# Patient Record
Sex: Female | Born: 1995 | Race: Black or African American | Hispanic: No | Marital: Single | Smoking: Never smoker
Health system: Southern US, Community
[De-identification: ages and names within clinical notes are randomized; demographics above are authoritative.]

## PROBLEM LIST (undated history)

## (undated) DIAGNOSIS — F32A Depression, unspecified: Secondary | ICD-10-CM

## (undated) DIAGNOSIS — F329 Major depressive disorder, single episode, unspecified: Secondary | ICD-10-CM

## (undated) DIAGNOSIS — G932 Benign intracranial hypertension: Secondary | ICD-10-CM

## (undated) DIAGNOSIS — F419 Anxiety disorder, unspecified: Secondary | ICD-10-CM

## (undated) DIAGNOSIS — F909 Attention-deficit hyperactivity disorder, unspecified type: Secondary | ICD-10-CM

## (undated) DIAGNOSIS — E301 Precocious puberty: Secondary | ICD-10-CM

## (undated) HISTORY — DX: Precocious puberty: E30.1

## (undated) HISTORY — DX: Benign intracranial hypertension: G93.2

## (undated) HISTORY — DX: Attention-deficit hyperactivity disorder, unspecified type: F90.9

## (undated) HISTORY — DX: Anxiety disorder, unspecified: F41.9

## (undated) HISTORY — PX: NO PAST SURGERIES: SHX2092

---
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
## 2016-01-04 ENCOUNTER — Telehealth: Payer: Self-pay | Admitting: Neurology

## 2016-01-04 NOTE — Telephone Encounter (Signed)
Please advise 

## 2016-01-04 NOTE — Telephone Encounter (Signed)
PT called and wanted to know what she can take for menstrual cramps because she said that Dr Tat said not to take aleve and the tylenol is not working/Dawn CB# [phone]

## 2016-01-04 NOTE — Telephone Encounter (Signed)
I said not to take aleve?  i don't remember telling her that at all.  I advised her not to take OTC supplements but I don't remember her asking me about aleve?

## 2016-01-04 NOTE — Telephone Encounter (Signed)
Patient made aware okay to take aleve if needed.

## 2016-01-19 ENCOUNTER — Encounter: Payer: Self-pay | Admitting: Physician Assistant

## 2016-01-19 ENCOUNTER — Ambulatory Visit (INDEPENDENT_AMBULATORY_CARE_PROVIDER_SITE_OTHER): Payer: Self-pay | Admitting: Physician Assistant

## 2016-01-19 VITALS — BP 90/68 | HR 91 | Temp 98.3°F | Ht 68.0 in | Wt 257.4 lb

## 2016-01-19 DIAGNOSIS — M25562 Pain in left knee: Secondary | ICD-10-CM

## 2016-01-19 DIAGNOSIS — J069 Acute upper respiratory infection, unspecified: Secondary | ICD-10-CM | POA: Insufficient documentation

## 2016-01-19 DIAGNOSIS — M25569 Pain in unspecified knee: Secondary | ICD-10-CM | POA: Insufficient documentation

## 2016-01-19 NOTE — Patient Instructions (Signed)
Please stay well-hydrated.  Get some saline nasal spray and use to keep nasal passages clear.  Use some Mucinex-DM for cough and congestion. Get plenty of rest.  Symptoms should resolve over the next 3-5 days.  For the knee -- ice the knee and keep elevated.  Tylenol or Ibuprofen for pain. This will resolve over a few days but try not to bump into anything else!  Upper Respiratory Infection, Adult Most upper respiratory infections (URIs) are a viral infection of the air passages leading to the lungs. A URI affects the nose, throat, and upper air passages. The most common type of URI is nasopharyngitis and is typically referred to as "the common cold." URIs run their course and usually go away on their own. Most of the time, a URI does not require medical attention, but sometimes a bacterial infection in the upper airways can follow a viral infection. This is called a secondary infection. Sinus and middle ear infections are common types of secondary upper respiratory infections. Bacterial pneumonia can also complicate a URI. A URI can worsen asthma and chronic obstructive pulmonary disease (COPD). Sometimes, these complications can require emergency medical care and may be life threatening.  CAUSES Almost all URIs are caused by viruses. A virus is a type of germ and can spread from one person to another.  RISKS FACTORS You may be at risk for a URI if:   You smoke.   You have chronic heart or lung disease.  You have a weakened defense (immune) system.   You are very young or very old.   You have nasal allergies or asthma.  You work in crowded or poorly ventilated areas.  You work in health care facilities or schools. SIGNS AND SYMPTOMS  Symptoms typically develop 2-3 days after you come in contact with a cold virus. Most viral URIs last 7-10 days. However, viral URIs from the influenza virus (flu virus) can last 14-18 days and are typically more severe. Symptoms may include:    Runny or stuffy (congested) nose.   Sneezing.   Cough.   Sore throat.   Headache.   Fatigue.   Fever.   Loss of appetite.   Pain in your forehead, behind your eyes, and over your cheekbones (sinus pain).  Muscle aches.  DIAGNOSIS  Your health care provider may diagnose a URI by:  Physical exam.  Tests to check that your symptoms are not due to another condition such as:  Strep throat.  Sinusitis.  Pneumonia.  Asthma. TREATMENT  A URI goes away on its own with time. It cannot be cured with medicines, but medicines may be prescribed or recommended to relieve symptoms. Medicines may help:  Reduce your fever.  Reduce your cough.  Relieve nasal congestion. HOME CARE INSTRUCTIONS   Take medicines only as directed by your health care provider.   Gargle warm saltwater or take cough drops to comfort your throat as directed by your health care provider.  Use a warm mist humidifier or inhale steam from a shower to increase air moisture. This may make it easier to breathe.  Drink enough fluid to keep your urine clear or pale yellow.   Eat soups and other clear broths and maintain good nutrition.   Rest as needed.   Return to work when your temperature has returned to normal or as your health care provider advises. You may need to stay home longer to avoid infecting others. You can also use a face mask and careful hand washing  to prevent spread of the virus.  Increase the usage of your inhaler if you have asthma.   Do not use any tobacco products, including cigarettes, chewing tobacco, or electronic cigarettes. If you need help quitting, ask your health care provider. PREVENTION  The best way to protect yourself from getting a cold is to practice good hygiene.   Avoid oral or hand contact with people with cold symptoms.   Wash your hands often if contact occurs.  There is no clear evidence that vitamin C, vitamin E, echinacea, or exercise  reduces the chance of developing a cold. However, it is always recommended to get plenty of rest, exercise, and practice good nutrition.  SEEK MEDICAL CARE IF:   You are getting worse rather than better.   Your symptoms are not controlled by medicine.   You have chills.  You have worsening shortness of breath.  You have brown or red mucus.  You have yellow or brown nasal discharge.  You have pain in your face, especially when you bend forward.  You have a fever.  You have swollen neck glands.  You have pain while swallowing.  You have white areas in the back of your throat. SEEK IMMEDIATE MEDICAL CARE IF:   You have severe or persistent:  Headache.  Ear pain.  Sinus pain.  Chest pain.  You have chronic lung disease and any of the following:  Wheezing.  Prolonged cough.  Coughing up blood.  A change in your usual mucus.  You have a stiff neck.  You have changes in your:  Vision.  Hearing.  Thinking.  Mood. MAKE SURE YOU:   Understand these instructions.  Will watch your condition.  Will get help right away if you are not doing well or get worse.   This information is not intended to replace advice given to you by your health care provider. Make sure you discuss any questions you have with your health care provider.   Document Released: 05/09/2001 Document Revised: 03/30/2015 Document Reviewed: 02/18/2014 Elsevier Interactive Patient Education [employer]

## 2016-01-19 NOTE — Progress Notes (Signed)
Pre visit review using our clinic review tool, if applicable. No additional management support is needed unless otherwise documented below in the visit note. 

## 2016-01-19 NOTE — Assessment & Plan Note (Signed)
Mild symptoms. Afebrile. Exam negative except for nasal congestion. Increase hydration. Mucinex-DM OTC. Supportive measures reviewed.

## 2016-01-19 NOTE — Progress Notes (Signed)
Patient presents to clinic today c/o 3 days of mild cough (dry), nasal congestion and runny nose. Denies fever, chills, chest congestion. Denies recent travel. Brother recently diagnosed with the flu.   Patient c/o L knee pain since this AM. Endorses hitting the outside of the knee on her bedpost this morning. Denies bruising of the knee. Denies other trauma or injury.   Past Medical History  Diagnosis Date  . ADHD (attention deficit hyperactivity disorder)   . Precocious puberty     Was followed by endocrinology.    Current Outpatient Prescriptions on File Prior to Visit  Medication Sig Dispense Refill  . acetaZOLAMIDE (DIAMOX) 500 MG capsule Take 1 capsule (500 mg total) by mouth 2 (two) times daily. 60 capsule 5  . escitalopram (LEXAPRO) 10 MG tablet Take 1 tablet by mouth at bedtime. Reported on 01/19/2016    . methylphenidate (DAYTRANA) 30 MG/9HR Place 1 patch onto the skin daily. Reported on 01/19/2016     No current facility-administered medications on file prior to visit.    Allergies  Allergen Reactions  . Lupron [Leuprolide Acetate]     Was allergic to a preservative in the Lupron.    Family History  Problem Relation Age of Onset  . Hypertension Mother   . Diabetes Father   . Heart disease Father   . Cancer Neg Hx     Social History   Social History  . Marital Status: Single    Spouse Name: N/A  . Number of Children: 0  . Years of Education: N/A   Occupational History  . student     [place] History Main Topics  . Smoking status: Never Smoker   . Smokeless tobacco: Never Used  . Alcohol Use: No  . Drug Use: No  . Sexual Activity: Not Asked   Other Topics Concern  . None   Social History Narrative   Caffeine Use:  3 daily   Regular Exercise:  2-3 x weekly   Lives with Mom step dad and brother   Attends [employer].   Patient is the granddaughter of [place] [employer] - See HPI.  All other ROS are  negative.  BP 90/68 mmHg  Pulse 91  Temp(Src) 98.3 F (36.8 C) (Oral)  Ht '5\' 8"'  (1.727 m)  Wt 257 lb 6.4 oz (116.756 kg)  BMI 39.15 kg/m2  SpO2 99%  LMP 12/29/2015  Physical Exam  Constitutional: She is well-developed, well-nourished, and in no distress.  HENT:  Head: Normocephalic and atraumatic.  Right Ear: External ear normal.  Left Ear: External ear normal.  Nose: Nose normal.  Mouth/Throat: Oropharynx is clear and moist. No oropharyngeal exudate.  TM within normal limits bilaterally.  Eyes: Conjunctivae are normal.  Neck: Neck supple.  Cardiovascular: Normal rate, regular rhythm, normal heart sounds and intact distal pulses.   Pulmonary/Chest: Effort normal and breath sounds normal. No respiratory distress. She has no wheezes. She has no rales. She exhibits no tenderness.  Musculoskeletal:       Left knee: She exhibits normal range of motion, no swelling, normal alignment, no LCL laxity, normal patellar mobility, no bony tenderness, normal meniscus and no MCL laxity. Tenderness found.  Skin: Skin is warm and dry. No rash noted.  Psychiatric: Affect normal.  Vitals reviewed.   Recent Results (from the past 2160 hour(s))  TSH     Status: None   Collection  Time: 11/17/15  3:26 PM  Result Value Ref Range   TSH 2.05 0.40 - 5.00 uIU/mL  Basic Metabolic Panel (BMET)     Status: None   Collection Time: 11/17/15  3:26 PM  Result Value Ref Range   Sodium 138 135 - 145 mEq/L   Potassium 4.1 3.5 - 5.1 mEq/L   Chloride 106 96 - 112 mEq/L   CO2 24 19 - 32 mEq/L   Glucose, Bld 84 70 - 99 mg/dL   BUN 8 6 - 23 mg/dL   Creatinine, Ser 0.54 0.40 - 1.20 mg/dL   Calcium 9.1 8.4 - 10.5 mg/dL   GFR 187.12 >60.00 mL/min  CBC     Status: Abnormal   Collection Time: 11/17/15  3:26 PM  Result Value Ref Range   WBC 9.2 4.5 - 13.5 K/uL   RBC 4.48 3.80 - 5.70 Mil/uL   Platelets 314.0 150.0 - 575.0 K/uL   Hemoglobin 11.9 (L) 12.0 - 16.0 g/dL   HCT 37.1 36.0 - 49.0 %   MCV 82.7 78.0 -  98.0 fl   MCHC 32.0 31.0 - 37.0 g/dL   RDW 14.7 11.4 - 15.5 %  Pregnancy, urine     Status: None   Collection Time: 12/11/15  8:55 PM  Result Value Ref Range   Preg Test, Ur NEGATIVE NEGATIVE    Comment:        THE SENSITIVITY OF THIS METHODOLOGY IS >20 mIU/mL.   Urinalysis, Routine w reflex microscopic (not at [facility])     Status: Abnormal   Collection Time: 12/11/15  8:55 PM  Result Value Ref Range   Color, Urine YELLOW YELLOW   APPearance CLOUDY (A) CLEAR   Specific Gravity, Urine 1.023 1.005 - 1.030   pH 7.5 5.0 - 8.0   Glucose, UA NEGATIVE NEGATIVE mg/dL   Hgb urine dipstick NEGATIVE NEGATIVE   Bilirubin Urine NEGATIVE NEGATIVE   Ketones, ur NEGATIVE NEGATIVE mg/dL   Protein, ur NEGATIVE NEGATIVE mg/dL   Nitrite NEGATIVE NEGATIVE   Leukocytes, UA LARGE (A) NEGATIVE  Urine microscopic-add on     Status: Abnormal   Collection Time: 12/11/15  8:55 PM  Result Value Ref Range   Squamous Epithelial / LPF 0-5 (A) NONE SEEN   WBC, UA 6-30 0 - 5 WBC/hpf   RBC / HPF 6-30 0 - 5 RBC/hpf   Bacteria, UA MANY (A) NONE SEEN  Basic metabolic panel     Status: None   Collection Time: 12/11/15  9:50 PM  Result Value Ref Range   Sodium 139 135 - 145 mmol/L   Potassium 4.2 3.5 - 5.1 mmol/L   Chloride 107 101 - 111 mmol/L   CO2 27 22 - 32 mmol/L   Glucose, Bld 91 65 - 99 mg/dL   BUN 12 6 - 20 mg/dL   Creatinine, Ser 0.54 0.44 - 1.00 mg/dL   Calcium 9.2 8.9 - 10.3 mg/dL   GFR calc non Af Amer >60 >60 mL/min   GFR calc Af Amer >60 >60 mL/min    Comment: (NOTE) The eGFR has been calculated using the CKD EPI equation. This calculation has not been validated in all clinical situations. eGFR's persistently <60 mL/min signify possible Chronic Kidney Disease.    Anion gap 5 5 - 15  CBC     Status: None   Collection Time: 12/11/15  9:50 PM  Result Value Ref Range   WBC 8.5 4.0 - 10.5 K/uL   RBC 4.61 3.87 - 5.11 MIL/uL  Hemoglobin 12.1 12.0 - 15.0 g/dL   HCT 38.5 36.0 - 46.0 %   MCV  83.5 78.0 - 100.0 fL   MCH 26.2 26.0 - 34.0 pg   MCHC 31.4 30.0 - 36.0 g/dL   RDW 14.1 11.5 - 15.5 %   Platelets 313 150 - 400 K/uL    Assessment/Plan: Knee pain Mild contusion. No bruising or swelling. RICE reviewed. OTC NSAID.  Viral URI Mild symptoms. Afebrile. Exam negative except for nasal congestion. Increase hydration. Mucinex-DM OTC. Supportive measures reviewed.

## 2016-01-19 NOTE — Assessment & Plan Note (Signed)
Mild contusion. No bruising or swelling. RICE reviewed. OTC NSAID.

## 2016-02-14 ENCOUNTER — Telehealth: Payer: Self-pay | Admitting: Neurology

## 2016-02-14 ENCOUNTER — Ambulatory Visit: Payer: Self-pay | Admitting: Physician Assistant

## 2016-02-14 DIAGNOSIS — G932 Benign intracranial hypertension: Secondary | ICD-10-CM

## 2016-02-14 NOTE — Telephone Encounter (Signed)
Left message on machine for patient's mother to call back.

## 2016-02-14 NOTE — Telephone Encounter (Signed)
Patient's mother called and had a question regarding her daughter. Her daughter's name is Annette Molina d.o.b 12/03/1996. Her call back number is [phone].

## 2016-02-14 NOTE — Telephone Encounter (Signed)
CBC, CMP.  Needs to drastically increase water intake.  I want her to fill up a gallon milk jug every day with water and drink 1/2 of that jug

## 2016-02-14 NOTE — Telephone Encounter (Addendum)
Spoke with patient's mother- she states patient has complained of headaches every day for the last week. She also had an episode of "zoning out" at work yesterday. States she just stopped and stood still for a few seconds, not focusing on anything. Patient did not remember doing this, but her boss told her she did. She has been compliant with Diamox twice daily. No vision changes. No other new symptoms. They have a follow up appt next month. Please advise.

## 2016-02-14 NOTE — Telephone Encounter (Signed)
Spoke with patient's mom. They state they don't remember being told to get lab work. They will go tomorrow to get this done. Any additional labs needed? She also states patient has lost 7-8 lbs, states she has been trying, but hasn't been very successful. Water intake is not high.

## 2016-02-14 NOTE — Telephone Encounter (Signed)
She was supposed to have a BMP 10 days after she started the diamox.  I don't see that she did that.  She still needs that done.  Is she drinking plenty of water with it?  Has she been working on the weight loss aspect?

## 2016-02-14 NOTE — Telephone Encounter (Signed)
PT's mother Eloise HarmanLakaisha called in regards to her having headaches again/Dawn CB# [phone]

## 2016-02-15 ENCOUNTER — Ambulatory Visit (INDEPENDENT_AMBULATORY_CARE_PROVIDER_SITE_OTHER): Payer: BLUE CROSS/BLUE SHIELD | Admitting: Physician Assistant

## 2016-02-15 ENCOUNTER — Encounter: Payer: Self-pay | Admitting: Physician Assistant

## 2016-02-15 VITALS — BP 110/70 | HR 82 | Temp 98.0°F | Ht 68.0 in | Wt 258.4 lb

## 2016-02-15 DIAGNOSIS — E669 Obesity, unspecified: Secondary | ICD-10-CM | POA: Diagnosis not present

## 2016-02-15 LAB — TSH: TSH: 1.53 u[IU]/mL (ref 0.40–5.00)

## 2016-02-15 NOTE — Progress Notes (Signed)
Patient presents to clinic today to discuss options for weight loss. Endorses starting diet and exercise regimen recommended by Neurology for her pseudotumor cerebri. Endorses good weight loss but has noted weight gain recently. Is not exercising as much as before. Was told by specialist to follow-up with PCP.  Past Medical History  Diagnosis Date  . ADHD (attention deficit hyperactivity disorder)   . Precocious puberty     Was followed by endocrinology.    Current Outpatient Prescriptions on File Prior to Visit  Medication Sig Dispense Refill  . acetaZOLAMIDE (DIAMOX) 500 MG capsule Take 1 capsule (500 mg total) by mouth 2 (two) times daily. 60 capsule 5   No current facility-administered medications on file prior to visit.    Allergies  Allergen Reactions  . Lupron [Leuprolide Acetate]     Was allergic to a preservative in the Lupron.    Family History  Problem Relation Age of Onset  . Hypertension Mother   . Diabetes Father   . Heart disease Father   . Cancer Neg Hx     Social History   Social History  . Marital Status: Single    Spouse Name: N/A  . Number of Children: 0  . Years of Education: N/A   Occupational History  . student     [place] History Main Topics  . Smoking status: Never Smoker   . Smokeless tobacco: Never Used  . Alcohol Use: No  . Drug Use: No  . Sexual Activity: Not Asked   Other Topics Concern  . None   Social History Narrative   Caffeine Use:  3 daily   Regular Exercise:  2-3 x weekly   Lives with Mom step dad and brother   Attends [employer].   Patient is the granddaughter of [place] [employer] - See HPI.  All other ROS are negative.  BP 110/70 mmHg  Pulse 82  Temp(Src) 98 F (36.7 C) (Oral)  Ht '5\' 8"'  (1.727 m)  Wt 258 lb 6.4 oz (117.209 kg)  BMI 39.30 kg/m2  SpO2 99%  LMP 01/26/2016  Physical Exam  Constitutional: She is oriented to person, place, and time and well-developed,  well-nourished, and in no distress.  HENT:  Head: Normocephalic.  Eyes: Conjunctivae are normal.  Cardiovascular: Normal rate, regular rhythm, normal heart sounds and intact distal pulses.   Pulmonary/Chest: Effort normal.  Neurological: She is alert and oriented to person, place, and time.  Skin: Skin is warm and dry.  Psychiatric: Affect normal.  Vitals reviewed.   Recent Results (from the past 2160 hour(s))  Pregnancy, urine     Status: None   Collection Time: 12/11/15  8:55 PM  Result Value Ref Range   Preg Test, Ur NEGATIVE NEGATIVE    Comment:        THE SENSITIVITY OF THIS METHODOLOGY IS >20 mIU/mL.   Urinalysis, Routine w reflex microscopic (not at [facility])     Status: Abnormal   Collection Time: 12/11/15  8:55 PM  Result Value Ref Range   Color, Urine YELLOW YELLOW   APPearance CLOUDY (A) CLEAR   Specific Gravity, Urine 1.023 1.005 - 1.030   pH 7.5 5.0 - 8.0   Glucose, UA NEGATIVE NEGATIVE mg/dL   Hgb urine dipstick NEGATIVE NEGATIVE   Bilirubin Urine NEGATIVE NEGATIVE   Ketones, ur NEGATIVE NEGATIVE mg/dL   Protein, ur NEGATIVE NEGATIVE mg/dL  Nitrite NEGATIVE NEGATIVE   Leukocytes, UA LARGE (A) NEGATIVE  Urine microscopic-add on     Status: Abnormal   Collection Time: 12/11/15  8:55 PM  Result Value Ref Range   Squamous Epithelial / LPF 0-5 (A) NONE SEEN   WBC, UA 6-30 0 - 5 WBC/hpf   RBC / HPF 6-30 0 - 5 RBC/hpf   Bacteria, UA MANY (A) NONE SEEN  Basic metabolic panel     Status: None   Collection Time: 12/11/15  9:50 PM  Result Value Ref Range   Sodium 139 135 - 145 mmol/L   Potassium 4.2 3.5 - 5.1 mmol/L   Chloride 107 101 - 111 mmol/L   CO2 27 22 - 32 mmol/L   Glucose, Bld 91 65 - 99 mg/dL   BUN 12 6 - 20 mg/dL   Creatinine, Ser 0.54 0.44 - 1.00 mg/dL   Calcium 9.2 8.9 - 10.3 mg/dL   GFR calc non Af Amer >60 >60 mL/min   GFR calc Af Amer >60 >60 mL/min    Comment: (NOTE) The eGFR has been calculated using the CKD EPI equation. This calculation has  not been validated in all clinical situations. eGFR's persistently <60 mL/min signify possible Chronic Kidney Disease.    Anion gap 5 5 - 15  CBC     Status: None   Collection Time: 12/11/15  9:50 PM  Result Value Ref Range   WBC 8.5 4.0 - 10.5 K/uL   RBC 4.61 3.87 - 5.11 MIL/uL   Hemoglobin 12.1 12.0 - 15.0 g/dL   HCT 38.5 36.0 - 46.0 %   MCV 83.5 78.0 - 100.0 fL   MCH 26.2 26.0 - 34.0 pg   MCHC 31.4 30.0 - 36.0 g/dL   RDW 14.1 11.5 - 15.5 %   Platelets 313 150 - 400 K/uL  TSH     Status: None   Collection Time: 02/15/16 10:43 AM  Result Value Ref Range   TSH 1.53 0.40 - 5.00 uIU/mL    Assessment/Plan: Obesity Body mass index is 39.3 kg/(m^2). Discussed need to resume exercise regimen. Meal planning guide given. Reviewed appropriate food types and spacing between meals.  Will check TSH level today to ensure thyroid is not contributing. Will attempt this diet and exercise plan for 4 weeks. If no improvement will consider medication short-term.

## 2016-02-15 NOTE — Telephone Encounter (Signed)
Patient's mother made aware.

## 2016-02-15 NOTE — Assessment & Plan Note (Signed)
Body mass index is 39.3 kg/(m^2). Discussed need to resume exercise regimen. Meal planning guide given. Reviewed appropriate food types and spacing between meals.  Will check TSH level today to ensure thyroid is not contributing. Will attempt this diet and exercise plan for 4 weeks. If no improvement will consider medication short-term.

## 2016-02-15 NOTE — Patient Instructions (Signed)
Please continue your exercise regimen. It sounds like you are doing fantastic.  Use the meal planning guide given to help with portion sizes and making sure you are getting enough of each food group.  Salads are good but Iceberg lettuce has little nutritional value. Swap in a romaine or spring mix to get more nutrition. When you do eat a salad for a meal, make sure there is also protein added (grilled chicken or lean fish like salmon).  Increase fruits and veggies.  Eat 3 meals per day and a small, healthy snack between meals.  Go to the lab so we can reassess thyroid function.  If labs are good and you make these changes without improvement, I will consider adding on a medication.

## 2016-02-15 NOTE — Progress Notes (Signed)
Pre visit review using our clinic review tool, if applicable. No additional management support is needed unless otherwise documented below in the visit note. 

## 2016-02-16 ENCOUNTER — Telehealth: Payer: Self-pay | Admitting: Neurology

## 2016-02-16 NOTE — Telephone Encounter (Signed)
Spoke with patient's mother. Aware I will fax [facility] to [facility] [place] and she can have labs drawn at that location.

## 2016-02-16 NOTE — Telephone Encounter (Signed)
Annette Molina's mother called with a question about having her blood work done at the med center in [place]. She would like you to call her back please. Her name is [name] [phone]. Thank you

## 2016-03-15 ENCOUNTER — Ambulatory Visit: Payer: BLUE CROSS/BLUE SHIELD | Admitting: Physician Assistant

## 2016-03-22 ENCOUNTER — Telehealth: Payer: Self-pay | Admitting: Neurology

## 2016-03-22 NOTE — Telephone Encounter (Signed)
Patient will discuss at her appt tomorrow.

## 2016-03-22 NOTE — Telephone Encounter (Signed)
PT called in regards to her medication and that she only has one pill left, also a question with weight loss on the medication and she has not lost weight but gained weight/Dawn  CB# [phone]

## 2016-03-23 ENCOUNTER — Other Ambulatory Visit (INDEPENDENT_AMBULATORY_CARE_PROVIDER_SITE_OTHER): Payer: BLUE CROSS/BLUE SHIELD

## 2016-03-23 ENCOUNTER — Encounter: Payer: Self-pay | Admitting: Neurology

## 2016-03-23 ENCOUNTER — Telehealth: Payer: Self-pay | Admitting: Neurology

## 2016-03-23 ENCOUNTER — Ambulatory Visit (INDEPENDENT_AMBULATORY_CARE_PROVIDER_SITE_OTHER): Payer: BLUE CROSS/BLUE SHIELD | Admitting: Neurology

## 2016-03-23 VITALS — BP 108/68 | HR 78 | Ht 68.0 in | Wt 256.0 lb

## 2016-03-23 DIAGNOSIS — G932 Benign intracranial hypertension: Secondary | ICD-10-CM

## 2016-03-23 LAB — COMPREHENSIVE METABOLIC PANEL
ALT: 10 U/L (ref 0–35)
AST: 13 U/L (ref 0–37)
Albumin: 3.8 g/dL (ref 3.5–5.2)
Alkaline Phosphatase: 79 U/L (ref 47–119)
BILIRUBIN TOTAL: 0.2 mg/dL (ref 0.2–1.2)
BUN: 8 mg/dL (ref 6–23)
CALCIUM: 9.2 mg/dL (ref 8.4–10.5)
CO2: 20 meq/L (ref 19–32)
CREATININE: 0.63 mg/dL (ref 0.40–1.20)
Chloride: 112 mEq/L (ref 96–112)
GFR: 156.05 mL/min (ref >60.00)
GLUCOSE: 118 mg/dL — AB (ref 70–99)
Potassium: 3.4 mEq/L — ABNORMAL LOW (ref 3.5–5.1)
Sodium: 139 mEq/L (ref 135–145)
Total Protein: 7.4 g/dL (ref 6.0–8.3)

## 2016-03-23 LAB — CBC WITH DIFFERENTIAL/PLATELET
BASOS ABS: 0.1 10*3/uL (ref 0.0–0.1)
Basophils Relative: 0.8 % (ref 0.0–3.0)
EOS ABS: 0.1 10*3/uL (ref 0.0–0.7)
Eosinophils Relative: 0.7 % (ref 0.0–5.0)
HEMATOCRIT: 37.2 % (ref 36.0–49.0)
Hemoglobin: 12.2 g/dL (ref 12.0–16.0)
LYMPHS PCT: 25.9 % (ref 24.0–48.0)
Lymphs Abs: 2.4 10*3/uL (ref 0.7–4.0)
MCHC: 32.9 g/dL (ref 31.0–37.0)
MCV: 80.6 fl (ref 78.0–98.0)
Monocytes Absolute: 0.9 10*3/uL (ref 0.1–1.0)
Monocytes Relative: 9.2 % (ref 3.0–12.0)
NEUTROS ABS: 5.9 10*3/uL (ref 1.4–7.7)
NEUTROS PCT: 63.4 % (ref 43.0–71.0)
PLATELETS: 337 10*3/uL (ref 150.0–575.0)
RBC: 4.62 Mil/uL (ref 3.80–5.70)
RDW: 15.3 % (ref 11.4–15.5)
WBC: 9.3 10*3/uL (ref 4.5–13.5)

## 2016-03-23 MED ORDER — ACETAZOLAMIDE ER 500 MG PO CP12: 500.0000 mg | ORAL_CAPSULE | Freq: Two times a day (BID) | ORAL | Status: DC

## 2016-03-23 MED ORDER — POTASSIUM CHLORIDE ER 20 MEQ PO TBCR: 20.0000 meq | EXTENDED_RELEASE_TABLET | Freq: Every day | ORAL | Status: DC

## 2016-03-23 NOTE — Telephone Encounter (Signed)
PT called and wanted to know what would happen if she did not take her medication tonight/Dawn CB# [phone]

## 2016-03-23 NOTE — Patient Instructions (Signed)
1. Your provider has requested that you have labwork completed today. Please go to [facility] ([address]) on the second floor of this building before leaving the office today. You do not need to check in. If you are not called within 15 minutes please check with the front desk.  2. We have sent a referral to Nutritionist. They will call directly with an appt. If you do not hear from them they can be reached at [phone].

## 2016-03-23 NOTE — Progress Notes (Signed)
Annette Molina was seen today in neurologic consultation at the request of [name], William Cody, PA-C.  The patient is seen today in neurologic consultation.  She is currently by her mother who helps to supplement the history.  I have reviewed prior records made available to me.  The patient reports that she is been having headaches since December (patient states December but mom states perhaps over the last few months).   She reports that headaches are unusual for her.   Headaches are located in the frontal region.  Headaches are described as throbbing and an annoying ache.  She has had vision changes with the headaches, described as blurry.  She has had no phonophobia but has had photophobia.  She did see the eye doctor this past Thursday for a routine Thursday for a routine visit for contacts and was told that she had swelling bilaterally.  She was told that she couldn't wear contacts right now.  She ended up going to the emergency room for dizziness on 12/11/2015 and mentioned this issue and subsequently had a CT of the brain.  It was reported to show a mildly prominent empty sella but it was otherwise unremarkable.  She was started on 25 mg of Topamax daily.  She was told to schedule outpatient follow-up.  She takes Lexapro and is supposed to be on methylphenidate patch but isn't taking that.  She occasionally takes excedrin but no other supplements.  She is not on birth control.  She has no IUD's.    12/24/15 update:  The patient follows up today, accompanied by her mother who supplements the history.  She underwent a lumbar puncture on 12/15/2015.  Opening pressure was reported to be 300 mm of water (but it was measured in the left lateral decubitus position and it does not appear that legs were extended).  She had an MRI of the brain and MRV of the brain since our last visit.  There is evidence of a partially empty sella with narrowing of the distal aspects of the transverse sinus, but no  evidence of dural venous sinus thrombosis.  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  I did send her for visual field testing and there was evidence of superior quadrant defect on the right.  Mild scattered defects on the left.  She hasn't had headache since the LP  03/23/16 update:  The patient follows up today, accompanied by her mother who supplements the history.  Patient has a history of pseudotumor cerebri.  She started Diamox and is on 500 mg twice per day.  She is faithful with it.  Last visit, I stressed the importance of diet and exercise, with the goal of weight loss.  Unfortunately, she really has not lost a significant amount of weight but she has lost 9 lbs since Jan.  She went to her primary care provider at the end of March and talked to him about weight loss and he talked to her about proper diet as well.  His notes did mention that he did not want to put her on any medication for at least a month.  She was supposed to have lab works for me and called me on March 20 with a headache and I asked her about the lab work.  Her mother stated that she did not know she was supposed to have lab work, so  I reminded her.  She went to the lab but they didn't draw that lab.  She does state that the headaches have come back in the L more than R temporal region.  She has dizziness with it.  She states it is a "pushing pain."  She is getting stuffed up with it.      ALLERGIES:   Allergies  Allergen Reactions  . Lupron [Leuprolide Acetate]     Was allergic to a preservative in the Lupron.    CURRENT MEDICATIONS:  Outpatient Encounter Prescriptions as of 03/23/2016  Medication Sig  . acetaZOLAMIDE (DIAMOX) 500 MG capsule Take 1 capsule (500 mg total) by mouth 2 (two) times daily.   No facility-administered encounter medications on file as of 03/23/2016.     PAST MEDICAL HISTORY:   Past Medical History  Diagnosis Date  . ADHD (attention deficit hyperactivity disorder)   . Precocious puberty     Was followed by endocrinology.    PAST SURGICAL HISTORY:   Past Surgical History  Procedure Laterality Date  . No past surgeries      SOCIAL HISTORY:   Social History   Social History  . Marital Status: Single    Spouse Name: N/A  . Number of Children: 0  . Years of Education: N/A   Occupational History  . 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 Smoking status: Never Smoker   . Smokeless tobacco: Never Used  . Alcohol Use: No  . Drug Use: No  . Sexual Activity: Not on file   Other Topics Concern  . Not on file   Social History Narrative   Caffeine Use:  3 daily   Regular Exercise:  2-3 x weekly   Lives with Mom step dad and brother   Attends [employer].   Patient is the granddaughter of [name]             FAMILY HISTORY:   Family Status  Relation Status Death Age  . Mother Alive     HTN  . Father Deceased     DM complications  . Brother Alive     3, healthy  . Sister Alive     2, healthy    ROS:    A complete 10 system review of systems was obtained and was unremarkable apart from what is mentioned above.  PHYSICAL EXAMINATION:    VITALS:   Filed Vitals:   03/23/16 1006  BP: 108/68  Pulse: 78  Height:  (1.727 m)  Weight: 256 lb (116.121 kg)   Wt Readings from Last 3 Encounters:  03/23/16 256 lb (116.121 kg) (99 %*, Z = 2.54)  02/15/16 258 lb 6.4 oz (117.209 kg) (99 %*, Z = 2.55)  01/19/16 257 lb 6.4 oz (116.756 kg) (99 %*, Z = 2.54)   * Growth percentiles are based on CDC 2-20 Years data.     GEN:  Normal appears female in no acute distress.  Appears stated age. HEENT:  Normocephalic, atraumatic. The mucous membranes are moist. The superficial temporal arteries are without ropiness or tenderness. Cardiovascular: Regular rate and rhythm. Lungs: Clear to auscultation  bilaterally. Neck/Heme: There are no carotid bruits noted bilaterally.  NEUROLOGICAL: Orientation:  The patient is alert and oriented x 3.   Cranial nerves: There is good facial symmetry. The pupils are equal round and reactive to light bilaterally. No papilledema noted on my funduscopic exam today.  Extraocular muscles are intact.  Visual fields were full today.    Speech is fluent and clear. Soft palate rises symmetrically and there is no tongue deviation. Hearing is intact to conversational tone. Tone: Tone is good throughout. Sensation: Sensation is intact to light touch throughout Coordination:  The patient has no difficulty with RAM's or FNF bilaterally. Motor: Strength is 5/5 in the bilateral upper and lower extremities.  Shoulder shrug is equal and symmetric. There is no pronator drift.  There are no fasciculations noted. Gait and Station: The patient is able to ambulate without difficulty but is bow legged.    IMPRESSION/PLAN  1.  Pseudotumor cerebri (benign intracranial hypertension)  -The patient needs to have lab work today.  She was supposed to have this a long time ago and it didn't get done  -Talked to the patient again about diet and exercise.  I do not want her on any medication for weight loss as this can increase intracranial pressure.  I will send her to a nutritionist.  Unfortunately, she is going to have to do this through diet and exercise and I know that this is hard and I talked to her about this.  -she has VF testing with [facility] on 5/27 and we decided to move that appt up to sooner so we can decide if we need to increase the dosage.  Wonder if current headaches are from barometric pressure changes or from sinus issues (seems stuffed up to me and her mom).  If VF normal now and no papilledema, could consider changing to topamax as does have carbonic anyhydrase inhib properties like diamox but not nearly as strong and probably 2nd line for pseudotumor.  Would like to  make sure that papilledema is better.  2.  Obesity  -will do nutrition c/s as above.  3.  Follow up is anticipated in the next few months, sooner should new neurologic issues arise.  Much greater than 50% of this visit was spent in counseling with the patient and the family.  Total face to face time:  25 min

## 2016-03-23 NOTE — Telephone Encounter (Signed)
Tell pt that potassium a little low but not bad. Take KCl daily (call that in). Also, BS little high but probably wasn't fasting today.

## 2016-03-23 NOTE — Telephone Encounter (Signed)
Patient made aware of lab results. She states pharmacy will not have a 90 day supply of Diamox available until tomorrow. She called because she has been off medication all day and doesn't want to go blind. I assured her that missing one day of medication would not cause this- but she asked that I send in a 30 day prescription to her pharmacy. 30 day supply of Diamox and Potassium sent to pharmacy.

## 2016-03-24 ENCOUNTER — Ambulatory Visit: Payer: BLUE CROSS/BLUE SHIELD | Admitting: Physician Assistant

## 2016-03-24 ENCOUNTER — Telehealth: Payer: Self-pay | Admitting: Physician Assistant

## 2016-03-27 ENCOUNTER — Ambulatory Visit: Payer: Self-pay | Admitting: Physician Assistant

## 2016-03-27 ENCOUNTER — Telehealth: Payer: Self-pay | Admitting: Physician Assistant

## 2016-03-27 NOTE — Telephone Encounter (Signed)
Appt was cancelled.  

## 2016-03-27 NOTE — Telephone Encounter (Signed)
Patient Name: Annette Molina  DOB: 01/15/1996    Initial Comment Caller states her headache is coming back as well as back pain--    Nurse Assessment  Nurse: Stefano GaulStringer, RN, Dwana CurdVera Date/Time (Eastern Time): 03/27/2016 12:35:14 PM  Confirm and document reason for call. If symptomatic, describe symptoms. You must click the next button to save text entered. ---Caller states she has a headache and back pain. She had a lumbar puncture in February or January. She is having severe back pain in the lower part of her back. aleve helps some. She is having pain when she urinates  Has the patient traveled out of the country within the last 30 days? ---Not Applicable  Does the patient have any new or worsening symptoms? ---Yes  Will a triage be completed? ---Yes  Related visit to physician within the last 2 weeks? ---No  Does the PT have any chronic conditions? (i.e. diabetes, asthma, etc.) ---No  Is the patient pregnant or possibly pregnant? (Ask all females between the ages of 5612-55) ---No  Is this a behavioral health or substance abuse call? ---No     Guidelines    Guideline Title Affirmed Question Affirmed Notes  Urination Pain - Female Side (flank) or lower back pain present    Final Disposition User   See Physician within 4 Hours (or PCP triage) Stefano GaulStringer, RN, Dwana CurdVera    Comments  pt states she has appt at 3 pm on 03/27/16 with Malva Coganody Martin  appt scheduled for 3 pm with Malva Coganody Martin on 03/27/16.   Referrals  REFERRED TO PCP OFFICE   Disagree/Comply: Comply

## 2016-03-27 NOTE — Telephone Encounter (Signed)
Called to follow up.  No answer.  Left a message for call back.   

## 2016-03-27 NOTE — Telephone Encounter (Signed)
Mother returned call.  She says she was unable to bring patient in due to transportation issues.  Daughter is unable to drive and did not have transportation to make appt today.   Mother says she will call back and reschedule once transportation could be arranged.

## 2016-03-29 NOTE — Telephone Encounter (Signed)
No charge. 

## 2016-03-29 NOTE — Telephone Encounter (Signed)
Pt was no show 03/24/16 3:15pm for acute appt, pt has not rescheduled, 1st no show, multiple cancellations, charge or no charge?

## 2016-03-30 ENCOUNTER — Encounter: Payer: Self-pay | Admitting: Physician Assistant

## 2016-03-30 NOTE — Telephone Encounter (Signed)
Waiving fee, mailing reminder letter °

## 2016-04-07 ENCOUNTER — Telehealth: Payer: Self-pay | Admitting: Physician Assistant

## 2016-04-07 NOTE — Telephone Encounter (Signed)
[facility] [place] [facility] - Client  TELEPHONE ADVICE RECORD   [facility]     Patient Name: Annette Molina Client [facility] [place] [facility] - Client    [facility] [place] [facility]    Physician Malva CoganMartin, Cody - PA    Contact Type Call    Who Is Calling Patient / Member / Family / Caregiver    Call Type Triage / Clinical    Relationship To Patient Self  Gender: Female Return Phone Number [phone](336) [phone] (Secondary)  DOB: 04/17/1996  Chief Complaint Blood Pressure High  Age: [age] Y 4 M 6 D Reason for Call Symptomatic / Request for Health Information  Return Phone Number: [phone] (Primary), [phone](336) [phone] (Secondary) Initial Comment Seen for high BP, 132/97 BP currently, no symptoms  Address:  PreDisposition Call Doctor  City/State/Zip: [place] [id number] Translation No    Nurse Assessment  Nurse: Logan BoresEvans, RN, Melissa Date/Time (Eastern Time): 04/07/2016 4:28:15 PM  Confirm and document reason for call. If symptomatic, describe symptoms. You must click the next button to save text entered. ---Seen for high BP, 132/97 BP currently, no symptoms  Has the patient traveled out of the country within the last 30 days? ---Not Applicable  Does the patient have any new or worsening symptoms? ---Yes  Will a triage be completed? ---Yes  Related visit to physician within the last 2 weeks? ---No  Does the PT have any chronic conditions? (i.e. diabetes, asthma, etc.) ---Yes  List chronic conditions. ---hypertension-Diamox  Is the patient pregnant or possibly pregnant? (Ask all females between the ages of 2212-55) ---No  Is this a behavioral health or substance abuse call? ---No    Guidelines      Guideline Title Affirmed Question Affirmed Notes Nurse Date/Time (Eastern Time)  High Blood Pressure [1] Taking BP medications AND [2] feels is having side effects (e.g., impotence, cough, dizzy upon standing)  Logan BoresEvans, RN, Melissa 04/07/2016  4:33:32 PM  Disp. Time [name](Eastern Time) Disposition Final User         04/07/2016 4:39:22 PM See PCP When Office is Open (within 3 days) Yes Logan BoresEvans, RN, Efraim KaufmannMelissa         Caller Understands: Yes   Disagree/Comply: Comply      Care Advice Given Per Guideline         SEE PCP WITHIN 3 DAYS: * You need to be seen within 2 or 3 days. Call your doctor during regular office hours and make an appointment. An urgent care center is often the best source of care if your doctor's office is closed or you can't get an appointment. NOTE: If office will be open tomorrow, tell caller to call then, not in 3 days. HYPERTENSION MEDICATIONS: * It is important to take prescribed medications as directed. * The goal of blood pressure treatment for most people with hypertension is to keep the blood pressure under 140/90. For people that are 60 years or older, your doctor may instead want to keep the blood pressure under 150/90. * Possible side effects include cough, dizziness with standing, drowsiness and impotence. CALL BACK IF: * You become worse.           Comments  User: Ardeen GarlandMelissa, Evans, RN Date/Time (Eastern Time): 04/07/2016 4:30:40 PM  Speaking to the caller and was suddenly disconnected. Attempted to call the caller back , call went to the voicemail where a voice message was left.  Referrals   REFERRED  TO PCP OFFICE

## 2016-04-07 NOTE — Telephone Encounter (Signed)
Pt has an appt scheduled with Malva Coganody Martin on 04/10/16 at 11 am.

## 2016-04-10 ENCOUNTER — Encounter: Payer: Self-pay | Admitting: Physician Assistant

## 2016-04-10 ENCOUNTER — Ambulatory Visit (INDEPENDENT_AMBULATORY_CARE_PROVIDER_SITE_OTHER): Payer: BLUE CROSS/BLUE SHIELD | Admitting: Physician Assistant

## 2016-04-10 ENCOUNTER — Telehealth: Payer: Self-pay | Admitting: Neurology

## 2016-04-10 VITALS — BP 118/83 | HR 88 | Temp 98.0°F | Ht 68.0 in | Wt 256.4 lb

## 2016-04-10 DIAGNOSIS — G932 Benign intracranial hypertension: Secondary | ICD-10-CM

## 2016-04-10 DIAGNOSIS — R030 Elevated blood-pressure reading, without diagnosis of hypertension: Secondary | ICD-10-CM

## 2016-04-10 DIAGNOSIS — R0789 Other chest pain: Secondary | ICD-10-CM

## 2016-04-10 DIAGNOSIS — R42 Dizziness and giddiness: Secondary | ICD-10-CM | POA: Diagnosis not present

## 2016-04-10 DIAGNOSIS — IMO0001 Reserved for inherently not codable concepts without codable children: Secondary | ICD-10-CM

## 2016-04-10 DIAGNOSIS — J309 Allergic rhinitis, unspecified: Secondary | ICD-10-CM | POA: Diagnosis not present

## 2016-04-10 LAB — CBC
HEMATOCRIT: 36.6 % (ref 36.0–49.0)
HEMOGLOBIN: 11.9 g/dL — AB (ref 12.0–16.0)
MCHC: 32.6 g/dL (ref 31.0–37.0)
MCV: 81.8 fl (ref 78.0–98.0)
PLATELETS: 320 10*3/uL (ref 150.0–575.0)
RBC: 4.48 Mil/uL (ref 3.80–5.70)
RDW: 15.6 % — ABNORMAL HIGH (ref 11.4–15.5)
WBC: 7.4 10*3/uL (ref 4.5–13.5)

## 2016-04-10 LAB — COMPREHENSIVE METABOLIC PANEL
ALBUMIN: 3.9 g/dL (ref 3.5–5.2)
ALK PHOS: 81 U/L (ref 47–119)
ALT: 13 U/L (ref 0–35)
AST: 16 U/L (ref 0–37)
BILIRUBIN TOTAL: 0.3 mg/dL (ref 0.2–1.2)
BUN: 9 mg/dL (ref 6–23)
CALCIUM: 9.2 mg/dL (ref 8.4–10.5)
CO2: 20 mEq/L (ref 19–32)
Chloride: 112 mEq/L (ref 96–112)
Creatinine, Ser: 0.53 mg/dL (ref 0.40–1.20)
GFR: 190.4 mL/min (ref >60.00)
Glucose, Bld: 96 mg/dL (ref 70–99)
POTASSIUM: 3.6 meq/L (ref 3.5–5.1)
Sodium: 139 mEq/L (ref 135–145)
Total Protein: 7.4 g/dL (ref 6.0–8.3)

## 2016-04-10 LAB — SEDIMENTATION RATE: SED RATE: 28 mm/h — AB (ref 0–22)

## 2016-04-10 MED ORDER — FLUTICASONE PROPIONATE 50 MCG/ACT NA SUSP: 2.0000 | Freq: Every day | NASAL | Status: DC

## 2016-04-10 NOTE — Telephone Encounter (Signed)
Got notes [facility] and papilledema resolved but VF had not, but also low reliability due to fixation loss.  If still has headaches and wants to talk about changing meds, get appt to discuss if wants earlier than her scheduled.

## 2016-04-10 NOTE — Telephone Encounter (Signed)
Patient's mother made aware. Appt moved sooner.

## 2016-04-10 NOTE — Progress Notes (Signed)
Pre visit review using our clinic review tool, if applicable. No additional management support is needed unless otherwise documented below in the visit note. 

## 2016-04-10 NOTE — Patient Instructions (Signed)
Please go to the lab for blood work. I will call with your results.  Your EKG and exam look good today. You have chest tenderness which makes me believe you have costochondritis (inflammed cartilage) causing chest pain. You can take tylenol or Ibuprofen to help with this. Avoid heavy lifting. Giving your recurrent symptoms, I am setting you up with Cardiology to see if any further cardiac assessment, including stress testing is warranted.  Please start Flonase for nasal congestion and sinus pressure.   Please schedule a follow-up with Dr. Arbutus Leas for further medication changes for your headaches from pseudotumor cerebri.  Please follow the diet below to help keep BP in a good range. Your BP is not high enough to consider medications.   DASH Eating Plan DASH stands for "Dietary Approaches to Stop Hypertension." The DASH eating plan is a healthy eating plan that has been shown to reduce high blood pressure (hypertension). Additional health benefits may include reducing the risk of type 2 diabetes mellitus, heart disease, and stroke. The DASH eating plan may also help with weight loss. WHAT DO I NEED TO KNOW ABOUT THE DASH EATING PLAN? For the DASH eating plan, you will follow these general guidelines:  Choose foods with a percent daily value for sodium of less than 5% (as listed on the food label).  Use salt-free seasonings or herbs instead of table salt or sea salt.  Check with your health care provider or pharmacist before using salt substitutes.  Eat lower-sodium products, often labeled as "lower sodium" or "no salt added."  Eat fresh foods.  Eat more vegetables, fruits, and low-fat dairy products.  Choose whole grains. Look for the word "whole" as the first word in the ingredient list.  Choose fish and skinless chicken or [place] more often than red meat. Limit fish, poultry, and meat to 6 oz (170 g) each day.  Limit sweets, desserts, sugars, and sugary drinks.  Choose heart-healthy  fats.  Limit cheese to 1 oz (28 g) per day.  Eat more home-cooked food and less restaurant, buffet, and fast food.  Limit fried foods.  Cook foods using methods other than frying.  Limit canned vegetables. If you do use them, rinse them well to decrease the sodium.  When eating at a restaurant, ask that your food be prepared with less salt, or no salt if possible. WHAT FOODS CAN I EAT? Seek help from a dietitian for individual calorie needs. Grains Whole grain or whole wheat bread. Brown rice. Whole grain or whole wheat pasta. Quinoa, bulgur, and whole grain cereals. Low-sodium cereals. Corn or whole wheat flour tortillas. Whole grain cornbread. Whole grain crackers. Low-sodium crackers. Vegetables Fresh or frozen vegetables (raw, steamed, roasted, or grilled). Low-sodium or reduced-sodium tomato and vegetable juices. Low-sodium or reduced-sodium tomato sauce and paste. Low-sodium or reduced-sodium canned vegetables.  Fruits All fresh, canned (in natural juice), or frozen fruits. Meat and Other Protein Products Ground beef (85% or leaner), grass-fed beef, or beef trimmed of fat. Skinless chicken or [place]. Ground chicken or [place]. Pork trimmed of fat. All fish and seafood. Eggs. Dried beans, peas, or lentils. Unsalted nuts and seeds. Unsalted canned beans. Dairy Low-fat dairy products, such as skim or 1% milk, 2% or reduced-fat cheeses, low-fat ricotta or cottage cheese, or plain low-fat yogurt. Low-sodium or reduced-sodium cheeses. Fats and Oils Tub margarines without trans fats. Light or reduced-fat mayonnaise and salad dressings (reduced sodium). Avocado. Safflower, olive, or canola oils. Natural peanut or almond butter. Other Unsalted popcorn and  pretzels. The items listed above may not be a complete list of recommended foods or beverages. Contact your dietitian for more options. WHAT FOODS ARE NOT RECOMMENDED? Grains White bread. White pasta. White rice. Refined cornbread.  Bagels and croissants. Crackers that contain trans fat. Vegetables Creamed or fried vegetables. Vegetables in a cheese sauce. Regular canned vegetables. Regular canned tomato sauce and paste. Regular tomato and vegetable juices. Fruits Dried fruits. Canned fruit in light or heavy syrup. Fruit juice. Meat and Other Protein Products Fatty cuts of meat. Ribs, chicken wings, bacon, sausage, bologna, salami, chitterlings, fatback, hot dogs, bratwurst, and packaged luncheon meats. Salted nuts and seeds. Canned beans with salt. Dairy Whole or 2% milk, cream, half-and-half, and cream cheese. Whole-fat or sweetened yogurt. Full-fat cheeses or blue cheese. Nondairy creamers and whipped toppings. Processed cheese, cheese spreads, or cheese curds. Condiments Onion and garlic salt, seasoned salt, table salt, and sea salt. Canned and packaged gravies. Worcestershire sauce. Tartar sauce. Barbecue sauce. Teriyaki sauce. Soy sauce, including reduced sodium. Steak sauce. Fish sauce. Oyster sauce. Cocktail sauce. Horseradish. Ketchup and mustard. Meat flavorings and tenderizers. Bouillon cubes. Hot sauce. Tabasco sauce. Marinades. Taco seasonings. Relishes. Fats and Oils Butter, stick margarine, lard, shortening, ghee, and bacon fat. Coconut, palm kernel, or palm oils. Regular salad dressings. Other Pickles and olives. Salted popcorn and pretzels. The items listed above may not be a complete list of foods and beverages to avoid. Contact your dietitian for more information. WHERE CAN I FIND MORE INFORMATION? National Heart, Lung, and Blood Institute: CablePromo.itwww.nhlbi.nih.gov/health/health-topics/topics/dash/   This information is not intended to replace advice given to you by your health care provider. Make sure you discuss any questions you have with your health care provider.   Document Released: 11/02/2011 Document Revised: 12/04/2014 Document Reviewed: 09/17/2013 Elsevier Interactive Patient Education Microsoft2016 Elsevier  Inc.

## 2016-04-10 NOTE — Progress Notes (Signed)
Patient with history of pseudotumor cerebri (followed by Neurology - Tat) and obesity presents to clinic today c/o elevated BP measurements over the past 2 weeks. Endorses home BP measurements at 132/90. Endorses taking right when she sat down. Did not recheck after resting for 5 minutes.  Endorses taking another time and noted BP at 135/80. Endorses dizziness and chest pain. Describes chest pain as "hitting" and mid sternal. Also endorses some lower rib tenderness bilaterally but denies trauma or injury. Endorses this occurs about once per day, lasting around 30 minutes or so. Denies palpitations or SOB during these episodes. Endorses headaches as well lasting all day, worsening the further out she is from lumbar puncture. Has appointment with Tat scheduled next week to discuss medication changes due to headaches.  Past Medical History  Diagnosis Date  . ADHD (attention deficit hyperactivity disorder)   . Precocious puberty     Was followed by endocrinology.    Current Outpatient Prescriptions on File Prior to Visit  Medication Sig Dispense Refill  . acetaZOLAMIDE (DIAMOX) 500 MG capsule Take 1 capsule (500 mg total) by mouth 2 (two) times daily. 60 capsule 0  . Potassium Chloride ER 20 MEQ TBCR Take 20 mEq by mouth daily. 30 tablet 2   No current facility-administered medications on file prior to visit.    Allergies  Allergen Reactions  . Lupron [Leuprolide Acetate]     Was allergic to a preservative in the Lupron.    Family History  Problem Relation Age of Onset  . Hypertension Mother   . Diabetes Father   . Heart disease Father   . Cancer Neg Hx     Social History   Social History  . Marital Status: Single    Spouse Name: N/A  . Number of Children: 0  . Years of Education: N/A   Occupational History  . student     [place] History Main Topics  . Smoking status: Never Smoker   . Smokeless tobacco: Never Used  . Alcohol Use: No  . Drug Use: No    . Sexual Activity: Not Asked   Other Topics Concern  . None   Social History Narrative   Caffeine Use:  3 daily   Regular Exercise:  2-3 x weekly   Lives with Mom step dad and brother   Attends [employer].   Patient is the granddaughter of [place] [employer] - See HPI.  All other ROS are negative.  BP 118/83 mmHg  Pulse 88  Temp(Src) 98 F (36.7 C) (Oral)  Ht '5\' 8"'  (1.727 m)  Wt 256 lb 6.4 oz (116.302 kg)  BMI 38.99 kg/m2  SpO2 100%  LMP 04/08/2016  Physical Exam  Constitutional: She is oriented to person, place, and time and well-developed, well-nourished, and in no distress.  HENT:  Head: Normocephalic and atraumatic.  Right Ear: External ear normal.  Left Ear: External ear normal.  Nose: Mucosal edema and rhinorrhea present. Right sinus exhibits no maxillary sinus tenderness and no frontal sinus tenderness. Left sinus exhibits no maxillary sinus tenderness and no frontal sinus tenderness.  Mouth/Throat: Uvula is midline, oropharynx is clear and moist and mucous membranes are normal. No oropharyngeal exudate.  Eyes: Conjunctivae are normal.  Neck: Neck supple.  Cardiovascular: Normal rate, regular rhythm, normal heart sounds and intact distal pulses.   Pulmonary/Chest: Effort normal and breath sounds normal. No respiratory distress.  She has no wheezes. She has no rales.    Lymphadenopathy:    She has no cervical adenopathy.  Neurological: She is alert and oriented to person, place, and time. She displays no weakness and facial symmetry. Coordination normal.  Vitals reviewed.   Recent Results (from the past 2160 hour(s))  TSH     Status: None   Collection Time: 02/15/16 10:43 AM  Result Value Ref Range   TSH 1.53 0.40 - 5.00 uIU/mL  CBC with Differential/Platelet     Status: None   Collection Time: 03/23/16 10:59 AM  Result Value Ref Range   WBC 9.3 4.5 - 13.5 K/uL   RBC 4.62 3.80 - 5.70 Mil/uL   Hemoglobin 12.2 12.0 - 16.0 g/dL   HCT 37.2  36.0 - 49.0 %   MCV 80.6 78.0 - 98.0 fl   MCHC 32.9 31.0 - 37.0 g/dL   RDW 15.3 11.4 - 15.5 %   Platelets 337.0 150.0 - 575.0 K/uL   Neutrophils Relative % 63.4 43.0 - 71.0 %   Lymphocytes Relative 25.9 24.0 - 48.0 %   Monocytes Relative 9.2 3.0 - 12.0 %   Eosinophils Relative 0.7 0.0 - 5.0 %   Basophils Relative 0.8 0.0 - 3.0 %   Neutro Abs 5.9 1.4 - 7.7 K/uL   Lymphs Abs 2.4 0.7 - 4.0 K/uL   Monocytes Absolute 0.9 0.1 - 1.0 K/uL   Eosinophils Absolute 0.1 0.0 - 0.7 K/uL   Basophils Absolute 0.1 0.0 - 0.1 K/uL  Comprehensive metabolic panel     Status: Abnormal   Collection Time: 03/23/16 10:59 AM  Result Value Ref Range   Sodium 139 135 - 145 mEq/L   Potassium 3.4 (L) 3.5 - 5.1 mEq/L   Chloride 112 96 - 112 mEq/L   CO2 20 19 - 32 mEq/L   Glucose, Bld 118 (H) 70 - 99 mg/dL   BUN 8 6 - 23 mg/dL   Creatinine, Ser 0.63 0.40 - 1.20 mg/dL   Total Bilirubin 0.2 0.2 - 1.2 mg/dL   Alkaline Phosphatase 79 47 - 119 U/L   AST 13 0 - 37 U/L   ALT 10 0 - 35 U/L   Total Protein 7.4 6.0 - 8.3 g/dL   Albumin 3.8 3.5 - 5.2 g/dL   Calcium 9.2 8.4 - 10.5 mg/dL   GFR 156.05 >60.00 mL/min   Assessment/Plan: 1. Atypical chest pain Seems consistent with costochondritis giving reproduction of pain on examination. EKG with NSR. However giving recurrent symptoms and mention of lightheadedness, will refer to Cardiology to assess need for stress testing/echo.  - EKG 12-Lead - Ambulatory referral to Cardiology - Sed Rate (ESR)  2. Lightheadedness EKG with NSR. OVS negative. Will check CBC and CMP. Question if this is related to her Pseudotumor Cerebri. Discussed good hydration and well-balanced diet. Referral to Cardiology placed. FU with Neurology as scheduled. Alarm signs/symptoms reviewed that would prompt ER follow-up - EKG 12-Lead - CBC - Comp Met (CMET) - Ambulatory referral to Cardiology  3. Allergic rhinitis, unspecified allergic rhinitis type Will begin Flonase daily. - fluticasone  (FLONASE) 50 MCG/ACT nasal spray; Place 2 sprays into both nostrils daily.  Dispense: 16 g; Refill: 6  4. Pseudotumor cerebri Follow-up with Neurology as directed to discuss medication changes.  5. Elevated BP BP Readings from Last 3 Encounters:  04/10/16 118/83  03/23/16 108/68  02/15/16 110/70   Normal BP at office visits. Recommended DASH diet to keep BP stable at home. Reviewed how to  appropriately check BP at home. Write these down and call with results.   Leeanne Rio, PA-C

## 2016-04-21 ENCOUNTER — Encounter: Payer: Self-pay | Admitting: Neurology

## 2016-04-21 ENCOUNTER — Ambulatory Visit (INDEPENDENT_AMBULATORY_CARE_PROVIDER_SITE_OTHER): Payer: BLUE CROSS/BLUE SHIELD | Admitting: Neurology

## 2016-04-21 VITALS — BP 108/68 | HR 66 | Ht 67.0 in | Wt 255.0 lb

## 2016-04-21 DIAGNOSIS — G932 Benign intracranial hypertension: Secondary | ICD-10-CM | POA: Diagnosis not present

## 2016-04-21 NOTE — Progress Notes (Signed)
Annette Molina was seen today in neurologic consultation at the request of Annette Molina, William Cody, PA-C.  The patient is seen today in neurologic consultation.  She is currently by her mother who helps to supplement the history.  I have reviewed prior records made available to me.  The patient reports that she is been having headaches since December (patient states December but mom states perhaps over the last few months).   She reports that headaches are unusual for her.   Headaches are located in the frontal region.  Headaches are described as throbbing and an annoying ache.  She has had vision changes with the headaches, described as blurry.  She has had no phonophobia but has had photophobia.  She did see the eye doctor this past Thursday for a routine Thursday for a routine visit for contacts and was told that she had swelling bilaterally.  She was told that she couldn't wear contacts right now.  She ended up going to the emergency room for dizziness on 12/11/2015 and mentioned this issue and subsequently had a CT of the brain.  It was reported to show a mildly prominent empty sella but it was otherwise unremarkable.  She was started on 25 mg of Topamax daily.  She was told to schedule outpatient follow-up.  She takes Lexapro and is supposed to be on methylphenidate patch but isn't taking that.  She occasionally takes excedrin but no other supplements.  She is not on birth control.  She has no IUD's.    12/24/15 update:  The patient follows up today, accompanied by her mother who supplements the history.  She underwent a lumbar puncture on 12/15/2015.  Opening pressure was reported to be 300 mm of water (but it was measured in the left lateral decubitus position and it does not appear that legs were extended).  She had an MRI of the brain and MRV of the brain since our last visit.  There is evidence of a partially empty sella with narrowing of the distal aspects of the transverse sinus, but no  evidence of dural venous sinus thrombosis.  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  I did send her for visual field testing and there was evidence of superior quadrant defect on the right.  Mild scattered defects on the left.  She hasn't had headache since the LP  03/23/16 update:  The patient follows up today, accompanied by her mother who supplements the history.  Patient has a history of pseudotumor cerebri.  She started Diamox and is on 500 mg twice per day.  She is faithful with it.  Last visit, I stressed the importance of diet and exercise, with the goal of weight loss.  Unfortunately, she really has not lost a significant amount of weight but she has lost 9 lbs since Jan.  She went to her primary care provider at the end of March and talked to him about weight loss and he talked to her about proper diet as well.  His notes did mention that he did not want to put her on any medication for at least a month.  She was supposed to have lab works for me and called me on March 20 with a headache and I asked her about the lab work.  Her mother stated that she did not know she was supposed to have lab work, so  I reminded her.  She went to the lab but they didn't draw that lab.  She does state that the headaches have come back in the L more than R temporal region.  She has dizziness with it.  She states it is a "pushing pain."  She is getting stuffed up with it.    04/21/16 update:  The patient follows up today, accompanied by her mother who supplements the history.  Patient has a history of pseudotumor cerebri.  She is on Diamox and is on 500 mg twice per day. [facility]  There was resolved papilledema bilaterally with distinct disc margins.  It stated that visual fields were not particularly improved, but low reliability due to  fixation losses.  When asked about headache, she states that she thinks that it is from not wearing her glasses during the day.  If she wears her glasses, there is no headache.  States that never got call with nutritionist.      ALLERGIES:   Allergies  Allergen Reactions  . Lupron [Leuprolide Acetate]     Was allergic to a preservative in the Lupron.    CURRENT MEDICATIONS:  Outpatient Encounter Prescriptions as of 04/21/2016  Medication Sig  . acetaZOLAMIDE (DIAMOX) 500 MG capsule Take 1 capsule (500 mg total) by mouth 2 (two) times daily.  . fluticasone (FLONASE) 50 MCG/ACT nasal spray Place 2 sprays into both nostrils daily.  . Potassium Chloride ER 20 MEQ TBCR Take 20 mEq by mouth daily.   No facility-administered encounter medications on file as of 04/21/2016.    PAST MEDICAL HISTORY:   Past Medical History  Diagnosis Date  . ADHD (attention deficit hyperactivity disorder)   . Precocious puberty     Was followed by endocrinology.    PAST SURGICAL HISTORY:   Past Surgical History  Procedure Laterality Date  . No past surgeries      SOCIAL HISTORY:   Social History   Social History  . Marital Status: Single    Spouse Name: N/A  . Number of Children: 0  . Years of Education: N/A   Occupational History  . 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 Smoking status: Never Smoker   . Smokeless tobacco: Never Used  . Alcohol Use: No  . Drug Use: No  . Sexual Activity: Not on file   Other Topics Concern  . Not on file   Social History Narrative   Caffeine Use:  3 daily   Regular Exercise:  2-3 x weekly   Lives with Mom step dad and brother   Attends [employer].   Patient is the granddaughter of [name]             FAMILY HISTORY:   Family Status  Relation Status Death Age  . Mother Alive     HTN  . Father Deceased     DM complications  . Brother Alive     3, healthy  . Sister Alive     2, healthy    ROS:    A complete 10  system review of systems was obtained and was unremarkable apart from what is mentioned above.  PHYSICAL EXAMINATION:    VITALS:   Filed Vitals:   04/21/16 0947  BP: 108/68  Pulse: 66  Height: 5\' 7"  (1.702 m)  Weight: 255 lb (115.667 kg)   Wt Readings from Last 3 Encounters:  04/21/16 255 lb (115.667 kg) (99 %*, Z = 2.53)  04/10/16 256 lb 6.4 oz (116.302 kg) (99 %*, Z = 2.54)  03/23/16 256 lb (116.121 kg) (99 %*, Z = 2.54)   * Growth percentiles are based on CDC 2-20 Years data.     GEN:  Normal appears female in no acute distress.  Appears stated age. HEENT:  Normocephalic, atraumatic. The mucous membranes are moist. The superficial temporal arteries are without ropiness or tenderness. Cardiovascular: Regular rate and rhythm. Lungs: Clear to auscultation bilaterally. Neck/Heme: There are no carotid bruits noted bilaterally.  NEUROLOGICAL: Orientation:  The patient is alert and oriented x 3.   Cranial nerves: There is good facial symmetry. The pupils are equal round and reactive to light bilaterally. No papilledema noted on my funduscopic exam today.  Extraocular muscles are intact.  Visual fields were full today.    Speech is fluent and clear. Soft palate rises symmetrically and there is no tongue deviation. Hearing is intact to conversational tone. Tone: Tone is good throughout. Sensation: Sensation is intact to light touch throughout Coordination:  The patient has no difficulty with RAM's or FNF bilaterally. Motor: Strength is 5/5 in the bilateral upper and lower extremities.  Shoulder shrug is equal and symmetric. There is no pronator drift.  There are no fasciculations noted. Gait and Station: The patient is able to ambulate without difficulty but is bow legged.    IMPRESSION/PLAN  1.  Pseudotumor cerebri (benign intracranial hypertension)  -As above, the patient saw her eye doctor on 03/30/2016.  Papilledema was resolved.  Visual fields were not improved, but it was felt  that it was low reliability due to fixation losses.  Regardless, I told the patient that visual fields may not improve, even with treatment.  The goal is to get rid of papilledema so that there is no worsening of any type of visual field deficit present.  She has been on Diamox since the end of January.  It has obviously worked well.  After a long discussion re: r/b patient and mom decided to d/c the diamox and the KCL.  Did tell the patient that without weight loss, this has high likelihood of returning.  -follow up [facility] for [facility] in 5 months and for repeat eye exam looking for return of papilledema.  -Talked to the patient again about diet and exercise.  She has a nutrition appointment pending.  Called again to find out why she wasn't contacted.  -wear glasses as it is only time that she has headache (when not wearing)  2.  Obesity  -will do nutrition c/s as above.  3.  Follow up as needed.  Will need to ask eye doctor when she is released for driving.  Much greater than 50% of this visit was spent in counseling and coordinating care.  Total face to face time:  30 min

## 2016-05-23 ENCOUNTER — Encounter: Payer: BLUE CROSS/BLUE SHIELD | Admitting: Medical

## 2016-05-23 ENCOUNTER — Telehealth: Payer: Self-pay | Admitting: Physician Assistant

## 2016-05-23 NOTE — Telephone Encounter (Signed)
No charge. 

## 2016-05-23 NOTE — Progress Notes (Signed)
This encounter was created in error - please disregard.

## 2016-05-23 NOTE — Telephone Encounter (Signed)
Mother stated she cant drive patient to appointment today at 11:30am due to her having surgery and patient father cant drive patient due to work conflict. [facility] Wednesday at 10:30am. Charge or no charge

## 2016-05-24 ENCOUNTER — Encounter: Payer: BLUE CROSS/BLUE SHIELD | Admitting: Medical

## 2016-05-24 DIAGNOSIS — Z0289 Encounter for other administrative examinations: Secondary | ICD-10-CM

## 2016-05-24 NOTE — Progress Notes (Signed)
This encounter was created in error - please disregard.

## 2016-05-26 ENCOUNTER — Encounter: Payer: Self-pay | Admitting: Physician Assistant

## 2016-06-01 ENCOUNTER — Ambulatory Visit (INDEPENDENT_AMBULATORY_CARE_PROVIDER_SITE_OTHER): Payer: BLUE CROSS/BLUE SHIELD | Admitting: Medical

## 2016-06-01 ENCOUNTER — Telehealth: Payer: Self-pay | Admitting: Medical

## 2016-06-01 ENCOUNTER — Encounter: Payer: Self-pay | Admitting: Medical

## 2016-06-01 ENCOUNTER — Encounter: Payer: Self-pay | Admitting: Physician Assistant

## 2016-06-01 VITALS — BP 110/72 | HR 78 | Temp 98.0°F | Ht 67.0 in | Wt 256.6 lb

## 2016-06-01 DIAGNOSIS — J309 Allergic rhinitis, unspecified: Secondary | ICD-10-CM | POA: Diagnosis not present

## 2016-06-01 DIAGNOSIS — R1084 Generalized abdominal pain: Secondary | ICD-10-CM

## 2016-06-01 DIAGNOSIS — T7840XA Allergy, unspecified, initial encounter: Secondary | ICD-10-CM

## 2016-06-01 DIAGNOSIS — J0100 Acute maxillary sinusitis, unspecified: Secondary | ICD-10-CM | POA: Diagnosis not present

## 2016-06-01 MED ORDER — RANITIDINE HCL 150 MG PO CAPS: 150.0000 mg | ORAL_CAPSULE | Freq: Two times a day (BID) | ORAL | Status: DC

## 2016-06-01 MED ORDER — LEVOCETIRIZINE DIHYDROCHLORIDE 5 MG PO TABS: 5.0000 mg | ORAL_TABLET | Freq: Every evening | ORAL | Status: DC

## 2016-06-01 MED ORDER — FLUTICASONE PROPIONATE 50 MCG/ACT NA SUSP: 2.0000 | Freq: Every day | NASAL | Status: DC

## 2016-06-01 MED ORDER — CEFDINIR 300 MG PO CAPS: 300.0000 mg | ORAL_CAPSULE | Freq: Two times a day (BID) | ORAL | Status: DC

## 2016-06-01 NOTE — Patient Instructions (Addendum)
Allergies rhinitis symptoms for one month. Rx flonase and xyzal.  If this does not take away pressure or congestion then rx cefdnir.  For your occasional abdomen pain will rx ranitidine. Will go ahead and order abd us,  cbc, cmp, and h pylori.  Follow up in 7 days or as needed

## 2016-06-01 NOTE — Progress Notes (Signed)
Subjective:    Patient ID: Annette Molina, female    DOB: 10/08/1996, 20 y.o.   MRN: [id number]  HPI  Pt in with nasal congestion for one month. Sinus pressure/congestion. Runny nose with some yellow appearance. No fever and no chills. No fever, no chills or sweats. But does feel tired.  Pt has been on flonase in the past but ran out.  Pt also mentioned some abdomen pain at end of exam. She states no burping or belching. No pain after eating. Pain will occur intermittently  for 10-15 minutes. No uti type symtpoms. No diarrhea or constipation.    LMP- May 27, 2016.   Review of Systems  Constitutional: Positive for fatigue. Negative for fever and chills.  HENT: Positive for congestion, rhinorrhea and sinus pressure. Negative for sore throat.   Respiratory: Negative for cough, shortness of breath and wheezing.   Cardiovascular: Negative for chest pain and palpitations.  Gastrointestinal: Negative for abdominal pain and constipation.  Genitourinary: Negative for dysuria and flank pain.  Musculoskeletal: Negative for back pain.  Neurological: Negative for dizziness and headaches.  Hematological: Negative for adenopathy. Does not bruise/bleed easily.  Psychiatric/Behavioral: Negative for behavioral problems and confusion.    Past Medical History  Diagnosis Date  . ADHD (attention deficit hyperactivity disorder)   . Precocious puberty     Was followed by endocrinology.     Social History   Social History  . Marital Status: Single    Spouse Name: N/A  . Number of Children: 0  . Years of Education: N/A   Occupational History  . 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 Smoking status: Never Smoker   . Smokeless tobacco: Never Used  . Alcohol Use: No  . Drug Use: No  . Sexual Activity: Not on file   Other Topics Concern  . Not on file   Social History Narrative   Caffeine Use:  3 daily   Regular Exercise:  2-3 x weekly   Lives with Mom  step dad and brother   Attends [employer].   Patient is the granddaughter of [name]             Past Surgical History  Procedure Laterality Date  . No past surgeries      Family History  Problem Relation Age of Onset  . Hypertension Mother   . Diabetes Father   . Heart disease Father   . Cancer Neg Hx     Allergies  Allergen Reactions  . Lupron [Leuprolide Acetate]     Was allergic to a preservative in the Lupron.    Current Outpatient Prescriptions on File Prior to Visit  Medication Sig Dispense Refill  . acetaZOLAMIDE (DIAMOX) 500 MG capsule Take 1 capsule (500 mg total) by mouth 2 (two) times daily. 60 capsule 0  . Potassium Chloride ER 20 MEQ TBCR Take 20 mEq by mouth daily. 30 tablet 2   No current facility-administered medications on file prior to visit.    BP 110/72 mmHg  Pulse 78  Temp(Src) 98 F (36.7 C) (Oral)  Ht 5\' 7"  (1.702 m)  Wt 256 lb 9.6 oz (116.393 kg)  BMI 40.18 kg/m2  SpO2 98%  LMP 04/27/2016       Objective:   Physical Exam  General  Mental Status - Alert. General Appearance - Well groomed. Not in acute distress.  Skin Rashes- No Rashes.  HEENT Head- Normal. Ear Auditory Canal - Left-  Normal. Right - Normal.Tympanic Membrane- Left- Normal. Right- Normal. Eye Sclera/Conjunctiva- Left- Normal. Right- Normal. Nose & Sinuses Nasal Mucosa- Left-  Boggy and Congested. Right-  Boggy and  Congested.Bilateral maxillary and frontal sinus pressure. Mouth & Throat Lips: Upper Lip- Normal: no dryness, cracking, pallor, cyanosis, or vesicular eruption. Lower Lip-Normal: no dryness, cracking, pallor, cyanosis or vesicular eruption. Buccal Mucosa- Bilateral- No Aphthous ulcers. Oropharynx- No Discharge or Erythema. Tonsils: Characteristics- Bilateral- No Erythema or Congestion. Size/Enlargement- Bilateral- No enlargement. Discharge- bilateral-None.  Neck Neck- Supple. No Masses.   Chest and Lung Exam Auscultation: Breath Sounds:-Clear  even and unlabored.  Cardiovascular Auscultation:Rythm- Regular, rate and rhythm. Murmurs & Other Heart Sounds:Ausculatation of the heart reveal- No Murmurs.  Lymphatic Head & Neck General Head & Neck Lymphatics: Bilateral: Description- No Localized lymphadenopathy.  Abdomen- soft, nontener, nondistended, +bs. No rebound or quarding.       Assessment & Plan:  Allergies rhinitis symptoms for one month. Rx flonase and xyzal.  If this does not take away pressure or congestion then rx cefdnir.  For your occasional abdomen pain will rx ranitidine. Assess response of your abdomen discomfort. If not stoppling then recommmend cbc, cmp, and h pylori.  Follow up in 7 days or as needed   At very end of exam she also mentioned touched strawberry and got faint rash on her forearm for 5 minutes. Then rash subsided. Advised not to touch strawberrys. Not to eat. Possible allergic. If she wants referral to allergies for testing let us know.  Tonatiuh Mallon, Ramon DredgeEdward, PA-C

## 2016-06-01 NOTE — Progress Notes (Signed)
Pre visit review using our clinic review tool, if applicable. No additional management support is needed unless otherwise documented below in the visit note. 

## 2016-06-01 NOTE — Addendum Note (Signed)
Addended by: Neldon LabellaMABE, HOLDEN S on: 06/01/2016 05:32 PM   Modules accepted: Orders

## 2016-06-01 NOTE — Addendum Note (Signed)
Addended by: Gwenevere AbbotSAGUIER, Larene Ascencio M on: 06/01/2016 03:57 PM   Modules accepted: Orders

## 2016-06-01 NOTE — Telephone Encounter (Signed)
Will you coordinate US of pt abdomen and explain on fasting.

## 2016-06-01 NOTE — Telephone Encounter (Signed)
Order sent to Imaging, they will handle scheduling and guidelines

## 2016-06-02 LAB — COMPREHENSIVE METABOLIC PANEL
ALBUMIN: 3.6 g/dL (ref 3.5–5.2)
ALT: 12 U/L (ref 0–35)
AST: 17 U/L (ref 0–37)
Alkaline Phosphatase: 77 U/L (ref 47–119)
BILIRUBIN TOTAL: 0.2 mg/dL (ref 0.2–1.2)
BUN: 13 mg/dL (ref 6–23)
CALCIUM: 9.1 mg/dL (ref 8.4–10.5)
CO2: 25 meq/L (ref 19–32)
CREATININE: 0.58 mg/dL (ref 0.40–1.20)
Chloride: 107 mEq/L (ref 96–112)
GFR: 171.33 mL/min (ref >60.00)
Glucose, Bld: 93 mg/dL (ref 70–99)
Potassium: 4.3 mEq/L (ref 3.5–5.1)
SODIUM: 138 meq/L (ref 135–145)
Total Protein: 6.6 g/dL (ref 6.0–8.3)

## 2016-06-02 LAB — CBC WITH DIFFERENTIAL/PLATELET
BASOS ABS: 0 10*3/uL (ref 0.0–0.1)
Basophils Relative: 0.5 % (ref 0.0–3.0)
EOS ABS: 0.2 10*3/uL (ref 0.0–0.7)
Eosinophils Relative: 2.2 % (ref 0.0–5.0)
HEMATOCRIT: 36.7 % (ref 36.0–49.0)
HEMOGLOBIN: 11.9 g/dL — AB (ref 12.0–16.0)
LYMPHS PCT: 28.3 % (ref 24.0–48.0)
Lymphs Abs: 2.5 10*3/uL (ref 0.7–4.0)
MCHC: 32.5 g/dL (ref 31.0–37.0)
MCV: 81.7 fl (ref 78.0–98.0)
Monocytes Absolute: 0.6 10*3/uL (ref 0.1–1.0)
Monocytes Relative: 6.5 % (ref 3.0–12.0)
Neutro Abs: 5.6 10*3/uL (ref 1.4–7.7)
Neutrophils Relative %: 62.5 % (ref 43.0–71.0)
Platelets: 301 10*3/uL (ref 150.0–575.0)
RBC: 4.5 Mil/uL (ref 3.80–5.70)
RDW: 14.4 % (ref 11.4–15.5)
WBC: 9 10*3/uL (ref 4.5–13.5)

## 2016-06-05 ENCOUNTER — Encounter: Payer: Self-pay | Admitting: Family Medicine

## 2016-06-05 ENCOUNTER — Ambulatory Visit (INDEPENDENT_AMBULATORY_CARE_PROVIDER_SITE_OTHER): Payer: BLUE CROSS/BLUE SHIELD | Admitting: Family Medicine

## 2016-06-05 ENCOUNTER — Ambulatory Visit (HOSPITAL_BASED_OUTPATIENT_CLINIC_OR_DEPARTMENT_OTHER)
Admission: RE | Admit: 2016-06-05 | Discharge: 2016-06-05 | Disposition: A | Discharge status: Home / Self Care | Payer: BLUE CROSS/BLUE SHIELD | Source: Ambulatory Visit | Attending: Family Medicine | Admitting: Family Medicine

## 2016-06-05 ENCOUNTER — Ambulatory Visit: Payer: BLUE CROSS/BLUE SHIELD | Admitting: Physician Assistant

## 2016-06-05 VITALS — BP 105/67 | HR 91 | Temp 98.7°F | Ht 68.0 in | Wt 255.6 lb

## 2016-06-05 DIAGNOSIS — S60211A Contusion of right wrist, initial encounter: Secondary | ICD-10-CM | POA: Diagnosis not present

## 2016-06-05 DIAGNOSIS — M25531 Pain in right wrist: Secondary | ICD-10-CM | POA: Diagnosis not present

## 2016-06-05 LAB — POCT URINE PREGNANCY: PREG TEST UR: NEGATIVE

## 2016-06-05 NOTE — Progress Notes (Signed)
Pre visit review using our clinic review tool, if applicable. No additional management support is needed unless otherwise documented below in the visit note. 
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
## 2016-06-05 NOTE — Patient Instructions (Signed)
It was good to see you today Let me know if your wrist is not doing better over the next 1-2 weeks- Sooner if worse.   Try using ice as needed

## 2016-06-07 ENCOUNTER — Ambulatory Visit (HOSPITAL_BASED_OUTPATIENT_CLINIC_OR_DEPARTMENT_OTHER): Payer: BLUE CROSS/BLUE SHIELD

## 2016-06-13 ENCOUNTER — Telehealth: Payer: Self-pay | Admitting: Neurology

## 2016-06-13 NOTE — Telephone Encounter (Signed)
Spoke with patient. She would like a note for [employer] excusing her from Spring 2017 semester. She had to drop her classes in January when she was first diagnosed/saw us for pseudotumor cerebri due to symptoms. Okay to write?

## 2016-06-13 NOTE — Telephone Encounter (Signed)
Patient made aware.

## 2016-06-13 NOTE — Telephone Encounter (Signed)
That note will need to come from the eye doc as I believe that vision was the issue?

## 2016-06-13 NOTE — Telephone Encounter (Signed)
Pt needs a letter for school please call [phone]

## 2016-06-20 ENCOUNTER — Telehealth: Payer: Self-pay | Admitting: Neurology

## 2016-06-21 NOTE — Telephone Encounter (Signed)
Spoke with patient. Made aware I could send a letter with her diagnosis, the date we first saw her, and her symptoms at the time.   Letter faxed to Clement Sayres at [phone] with confirmation received.

## 2016-06-28 ENCOUNTER — Ambulatory Visit: Payer: Self-pay | Admitting: Neurology

## 2016-07-13 ENCOUNTER — Ambulatory Visit (INDEPENDENT_AMBULATORY_CARE_PROVIDER_SITE_OTHER): Payer: BLUE CROSS/BLUE SHIELD | Admitting: Family Medicine

## 2016-07-13 VITALS — BP 116/82 | HR 88 | Temp 98.6°F | Ht 67.0 in | Wt 257.2 lb

## 2016-07-13 DIAGNOSIS — J0110 Acute frontal sinusitis, unspecified: Secondary | ICD-10-CM

## 2016-07-13 DIAGNOSIS — R0981 Nasal congestion: Secondary | ICD-10-CM

## 2016-07-13 MED ORDER — AMOXICILLIN 500 MG PO CAPS: 1000.0000 mg | ORAL_CAPSULE | Freq: Two times a day (BID) | ORAL | 0 refills | Status: DC

## 2016-07-13 NOTE — Patient Instructions (Signed)
We will try treating you for a sinus infection to see if this will resolve your symptoms Use the amoxicillin antibiotic as directed, and you might also try some OTC afrin nasal spray for a few days. However do not use this spray for more than 4 days at a time!  mucinex may also be helpful Let me know if you are not feeling better soon
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
## 2016-08-08 ENCOUNTER — Ambulatory Visit: Payer: BLUE CROSS/BLUE SHIELD | Admitting: Neurology

## 2016-08-16 NOTE — Progress Notes (Signed)
Annette Molina was seen today in neurologic consultation at the request of [name], William Cody, PA-C.  The patient is seen today in neurologic consultation.  She is currently by her mother who helps to supplement the history.  I have reviewed prior records made available to me.  The patient reports that she is been having headaches since December (patient states December but mom states perhaps over the last few months).   She reports that headaches are unusual for her.   Headaches are located in the frontal region.  Headaches are described as throbbing and an annoying ache.  She has had vision changes with the headaches, described as blurry.  She has had no phonophobia but has had photophobia.  She did see the eye doctor this past Thursday for a routine Thursday for a routine visit for contacts and was told that she had swelling bilaterally.  She was told that she couldn't wear contacts right now.  She ended up going to the emergency room for dizziness on 12/11/2015 and mentioned this issue and subsequently had a CT of the brain.  It was reported to show a mildly prominent empty sella but it was otherwise unremarkable.  She was started on 25 mg of Topamax daily.  She was told to schedule outpatient follow-up.  She takes Lexapro and is supposed to be on methylphenidate patch but isn't taking that.  She occasionally takes excedrin but no other supplements.  She is not on birth control.  She has no IUD's.    12/24/15 update:  The patient follows up today, accompanied by her mother who supplements the history.  She underwent a lumbar puncture on 12/15/2015.  Opening pressure was reported to be 300 mm of water (but it was measured in the left lateral decubitus position and it does not appear that legs were extended).  She had an MRI of the brain and MRV of the brain since our last visit.  There is evidence of a partially empty sella with narrowing of the distal aspects of the transverse sinus, but no  evidence of dural venous sinus thrombosis.  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  I did send her for visual field testing and there was evidence of superior quadrant defect on the right.  Mild scattered defects on the left.  She hasn't had headache since the LP  03/23/16 update:  The patient follows up today, accompanied by her mother who supplements the history.  Patient has a history of pseudotumor cerebri.  She started Diamox and is on 500 mg twice per day.  She is faithful with it.  Last visit, I stressed the importance of diet and exercise, with the goal of weight loss.  Unfortunately, she really has not lost a significant amount of weight but she has lost 9 lbs since Jan.  She went to her primary care provider at the end of March and talked to him about weight loss and he talked to her about proper diet as well.  His notes did mention that he did not want to put her on any medication for at least a month.  She was supposed to have lab works for me and called me on March 20 with a headache and I asked her about the lab work.  Her mother stated that she did not know she was supposed to have lab work, so  I reminded her.  She went to the lab but they didn't draw that lab.  She does state that the headaches have come back in the L more than R temporal region.  She has dizziness with it.  She states it is a "pushing pain."  She is getting stuffed up with it.    04/21/16 update:  The patient follows up today, accompanied by her mother who supplements the history.  Patient has a history of pseudotumor cerebri.  She is on Diamox and is on 500 mg twice per day. [facility].  There was resolved papilledema bilaterally with distinct disc margins.  It stated that visual fields were not particularly improved, but low reliability due to  fixation losses.  When asked about headache, she states that she thinks that it is from not wearing her glasses during the day.  If she wears her glasses, there is no headache.  States that never got call with nutritionist.    08/17/16 update:  The patient follows up today, accompanied by her mother who supplements the history.  She was taken off of Diamox last visit, as papilledema had resolved.  She reports "everything is good."   Then her mom told her that "you need to tell the truth."  Then patient states that she has headaches.  Headaches are over the L temple and feels that it is "like needles."  Mom says she seems to "space out" every few days.  Headaches are one time a week.  While her mom thought that she was to the eye doctor in June, we called their office and last visit was January.    ALLERGIES:   Allergies  Allergen Reactions  . Lupron [Leuprolide Acetate]     Was allergic to a preservative in the Lupron.    CURRENT MEDICATIONS:  Outpatient Encounter Prescriptions as of 08/17/2016  Medication Sig  . ranitidine (ZANTAC) 150 MG capsule Take 1 capsule (150 mg total) by mouth 2 (two) times daily.  . [DISCONTINUED] amoxicillin (AMOXIL) 500 MG capsule Take 2 capsules (1,000 mg total) by mouth 2 (two) times daily.  . [DISCONTINUED] fluticasone (FLONASE) 50 MCG/ACT nasal spray Place 2 sprays into both nostrils daily.   No facility-administered encounter medications on file as of 08/17/2016.     PAST MEDICAL HISTORY:   Past Medical History:  Diagnosis Date  . ADHD (attention deficit hyperactivity disorder)   . Precocious puberty    Was followed by endocrinology.    PAST SURGICAL HISTORY:   Past Surgical History:  Procedure Laterality Date  . NO PAST SURGERIES      SOCIAL HISTORY:   Social History   Social History  . Marital status: Single    Spouse name: N/A  . Number of children: 0  . Years of education: N/A   Occupational History  . 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 Smoking status: Never Smoker  . Smokeless tobacco: Never Used  . Alcohol use No  . Drug use: No  . Sexual activity: Not on file   Other Topics Concern  . Not on file   Social History Narrative   Caffeine Use:  3 daily   Regular Exercise:  2-3 x weekly   Lives with Mom step dad and brother   Attends [employer].   Patient is the granddaughter of [name]  FAMILY HISTORY:   Family Status  Relation Status  . Mother Alive   HTN  . Father Deceased   DM complications  . Brother Alive   3, healthy  . Sister Alive   2, healthy    ROS:    A complete 10 system review of systems was obtained and was unremarkable apart from what is mentioned above.  PHYSICAL EXAMINATION:    VITALS:   Vitals:   08/17/16 1500  Weight: 254 lb (115.2 kg)  Height: 5' 6.5" (1.689 m)   Wt Readings from Last 3 Encounters:  08/17/16 254 lb (115.2 kg) (>99 %, Z > 2.33)*  07/13/16 257 lb 3.2 oz (116.7 kg) (>99 %, Z > 2.33)*  06/05/16 255 lb 9.6 oz (115.9 kg) (>99 %, Z > 2.33)*   * Growth percentiles are based on CDC 2-20 Years data.     GEN:  Normal appears female in no acute distress.  Appears stated age. HEENT:  Normocephalic, atraumatic. The mucous membranes are moist. The superficial temporal arteries are without ropiness or tenderness. Cardiovascular: Regular rate and rhythm. Lungs: Clear to auscultation bilaterally. Neck/Heme: There are no carotid bruits noted bilaterally.  NEUROLOGICAL: Orientation:  The patient is alert and oriented x 3.   Cranial nerves: There is good facial symmetry. The pupils are equal round and reactive to light bilaterally. No papilledema noted on my funduscopic exam today.  Extraocular muscles are intact.  Visual fields were full today.    Speech is fluent and clear. Soft palate rises symmetrically and there is no tongue deviation. Hearing is intact to conversational tone. Tone: Tone is good throughout. Sensation:  Sensation is intact to light touch throughout Coordination:  The patient has no difficulty with RAM's or FNF bilaterally. Motor: Strength is 5/5 in the bilateral upper and lower extremities.  Shoulder shrug is equal and symmetric. There is no pronator drift.  There are no fasciculations noted. Gait and Station: The patient is able to ambulate without difficulty but has valgus deformities bilaterally   IMPRESSION/PLAN  1.  Pseudotumor cerebri (benign intracranial hypertension)  -As above, the patient saw her eye doctor on 03/30/2016.  Papilledema was resolved.  Visual fields were not improved, but it was felt that it was low reliability due to fixation losses.  Off of diamox as of May, 2017  -will have her f/u with [facility] and I made that appt  -reports some headache but doesn't sound like pseudotumor.    -Talked to the patient again about diet and exercise.  -will f/u with her after above as could consider topamax if frequent headaches persist or just try intervention like triptan when gets the headache.  2.  Obesity  -talked about diet  3.  Follow up as needed.  Will need to ask eye doctor when she is released for driving.  Much greater than 50% of this visit was spent in counseling and coordinating care.  Total face to face time:  30 min

## 2016-08-17 ENCOUNTER — Ambulatory Visit (INDEPENDENT_AMBULATORY_CARE_PROVIDER_SITE_OTHER): Payer: BLUE CROSS/BLUE SHIELD | Admitting: Neurology

## 2016-08-17 ENCOUNTER — Encounter: Payer: Self-pay | Admitting: Neurology

## 2016-08-17 VITALS — Ht 66.5 in | Wt 254.0 lb

## 2016-08-17 DIAGNOSIS — G932 Benign intracranial hypertension: Secondary | ICD-10-CM | POA: Diagnosis not present

## 2016-08-17 NOTE — Patient Instructions (Signed)
VIsual field testing scheduled at [facility] on Saturday 08/26/16 at 9:40 am.

## 2016-09-06 ENCOUNTER — Telehealth: Payer: Self-pay | Admitting: Physician Assistant

## 2016-09-06 NOTE — Telephone Encounter (Signed)
LVM for patient to know it is okay to call back and schedule.

## 2016-09-06 NOTE — Telephone Encounter (Signed)
That is fine to schedule her for a Wednesday after 4:30

## 2016-09-06 NOTE — Telephone Encounter (Signed)
Patient would like to come in to speak with Selena Battenody about weight loss but can only come in at 4:30pm or later. The 4:30 and later appointments are same day appointments on his late days... Please advise.   Patient relation: self Patient phone: [phone]

## 2016-09-13 ENCOUNTER — Ambulatory Visit (INDEPENDENT_AMBULATORY_CARE_PROVIDER_SITE_OTHER): Payer: BLUE CROSS/BLUE SHIELD | Admitting: Physician Assistant

## 2016-09-13 ENCOUNTER — Encounter: Payer: Self-pay | Admitting: Physician Assistant

## 2016-09-13 VITALS — BP 116/80 | HR 81 | Temp 99.1°F | Resp 16 | Ht 66.5 in | Wt 255.0 lb

## 2016-09-13 DIAGNOSIS — F39 Unspecified mood [affective] disorder: Secondary | ICD-10-CM | POA: Diagnosis not present

## 2016-09-13 DIAGNOSIS — R4586 Emotional lability: Secondary | ICD-10-CM

## 2016-09-13 MED ORDER — ESCITALOPRAM OXALATE 10 MG PO TABS: 10.0000 mg | ORAL_TABLET | Freq: Every day | ORAL | 1 refills | Status: DC

## 2016-09-13 NOTE — Patient Instructions (Signed)
Please take the Lexapro as directed. I will work on getting the [employer] approved.  Continue with exercise. Increase to at least 150 minutes per week. Eat healthy meals with good vegetable portions. Lean protein like chicken or fish are best options.

## 2016-09-13 NOTE — Progress Notes (Signed)
Patient presents to clinic today to discuss weight loss options. Patient with morbid obesity and pseudotumor cerebri, followed by Neurology. Body mass index is 40.54 kg/m. Patient is currently going to the gym 3 times per week. Is working out around an hour each trip -- Zumba and jogging mainly. Patient and mother note that patient has stopped eating regularly because she is not losing weight with exercise alone. Patient notes she eats one main meal per day and only hydrates the rest of the day.  Deny any binging/purging.   Patient also endorses irritability and depressed mood with anhedonia. Denies change to sleep. Denies SI/HI. Previously on Lexapro with good relief of symptoms.   Past Medical History:  Diagnosis Date  . ADHD (attention deficit hyperactivity disorder)   . Precocious puberty    Was followed by endocrinology.    No current outpatient prescriptions on file prior to visit.   No current facility-administered medications on file prior to visit.     Allergies  Allergen Reactions  . Lupron [Leuprolide Acetate]     Was allergic to a preservative in the Lupron.    Family History  Problem Relation Age of Onset  . Hypertension Mother   . Diabetes Father   . Heart disease Father   . Cancer Neg Hx     Social History   Social History  . Marital status: Single    Spouse name: N/A  . Number of children: 0  . Years of education: N/A   Occupational History  . 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 Smoking status: Never Smoker  . Smokeless tobacco: Never Used  . Alcohol use No  . Drug use: No  . Sexual activity: Not Asked   Other Topics Concern  . None   Social History Narrative   Caffeine Use:  3 daily   Regular Exercise:  2-3 x weekly   Lives with Mom step dad and brother   Attends [employer].   Patient is the granddaughter of [name]             Review of Systems - See HPI.  All other ROS are negative.  BP 116/80 (BP  Location: Left Arm, Patient Position: Sitting, Cuff Size: Large)   Pulse 81   Temp 99.1 F (37.3 C) (Oral)   Resp 16   Ht 5' 6.5" (1.689 m)   Wt 255 lb (115.7 kg)   LMP 08/30/2016   BMI 40.54 kg/m   Physical Exam  Constitutional: She is oriented to person, place, and time and well-developed, well-nourished, and in no distress.  HENT:  Head: Normocephalic.  Eyes: Conjunctivae are normal.  Cardiovascular: Normal rate, regular rhythm, normal heart sounds and intact distal pulses.   Pulmonary/Chest: Effort normal.  Neurological: She is alert and oriented to person, place, and time.  Skin: Skin is warm and dry. No rash noted.  Psychiatric: Affect normal.  Vitals reviewed.  Assessment/Plan: Mood swings Will restart Lexapro at previous dose. Discussed counseling. Declines at present. Follow-up 1 month.  Obesity Body mass index is 40.54 kg/m. Will work up to at least 150 minutes of aerobic exercise per week. Patient encouraged to add on resistance training. Discussed need for regular intake to promote good nutrient intake and metabolism. Discussed that abstaining from meals will only cause body to store intake as fat.  Will work on Transport plannergetting Belviq approved. Need to avoid stimulant if possible 2/2 pseudotumor.    Daphine DeutscherMartin,  Sandria Manly, PA-C

## 2016-09-14 NOTE — Progress Notes (Deleted)
Annette Molina was seen today in neurologic consultation at the request of [name], William Cody, PA-C.  The patient is seen today in neurologic consultation.  She is currently by her mother who helps to supplement the history.  I have reviewed prior records made available to me.  The patient reports that she is been having headaches since December (patient states December but mom states perhaps over the last few months).   She reports that headaches are unusual for her.   Headaches are located in the frontal region.  Headaches are described as throbbing and an annoying ache.  She has had vision changes with the headaches, described as blurry.  She has had no phonophobia but has had photophobia.  She did see the eye doctor this past Thursday for a routine Thursday for a routine visit for contacts and was told that she had swelling bilaterally.  She was told that she couldn't wear contacts right now.  She ended up going to the emergency room for dizziness on 12/11/2015 and mentioned this issue and subsequently had a CT of the brain.  It was reported to show a mildly prominent empty sella but it was otherwise unremarkable.  She was started on 25 mg of Topamax daily.  She was told to schedule outpatient follow-up.  She takes Lexapro and is supposed to be on methylphenidate patch but isn't taking that.  She occasionally takes excedrin but no other supplements.  She is not on birth control.  She has no IUD's.    12/24/15 update:  The patient follows up today, accompanied by her mother who supplements the history.  She underwent a lumbar puncture on 12/15/2015.  Opening pressure was reported to be 300 mm of water (but it was measured in the left lateral decubitus position and it does not appear that legs were extended).  She had an MRI of the brain and MRV of the brain since our last visit.  There is evidence of a partially empty sella with narrowing of the distal aspects of the transverse sinus, but no  evidence of dural venous sinus thrombosis.  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  I did send her for visual field testing and there was evidence of superior quadrant defect on the right.  Mild scattered defects on the left.  She hasn't had headache since the LP  03/23/16 update:  The patient follows up today, accompanied by her mother who supplements the history.  Patient has a history of pseudotumor cerebri.  She started Diamox and is on 500 mg twice per day.  She is faithful with it.  Last visit, I stressed the importance of diet and exercise, with the goal of weight loss.  Unfortunately, she really has not lost a significant amount of weight but she has lost 9 lbs since Jan.  She went to her primary care provider at the end of March and talked to him about weight loss and he talked to her about proper diet as well.  His notes did mention that he did not want to put her on any medication for at least a month.  She was supposed to have lab works for me and called me on March 20 with a headache and I asked her about the lab work.  Her mother stated that she did not know she was supposed to have lab work, so  I reminded her.  She went to the lab but they didn't draw that lab.  She does state that the headaches have come back in the L more than R temporal region.  She has dizziness with it.  She states it is a "pushing pain."  She is getting stuffed up with it.    04/21/16 update:  The patient follows up today, accompanied by her mother who supplements the history.  Patient has a history of pseudotumor cerebri.  She is on Diamox and is on 500 mg twice per day. She saw her eye doctor at [facility] on 02/29/2016.  There was resolved papilledema bilaterally with distinct disc margins.  It stated that visual fields were not particularly improved, but low reliability due to  fixation losses.  When asked about headache, she states that she thinks that it is from not wearing her glasses during the day.  If she wears her glasses, there is no headache.  States that never got call with nutritionist.    08/17/16 update:  The patient follows up today, accompanied by her mother who supplements the history.  She was taken off of Diamox last visit, as papilledema had resolved.  She reports "everything is good."   Then her mom told her that "you need to tell the truth."  Then patient states that she has headaches.  Headaches are over the L temple and feels that it is "like needles."  Mom says she seems to "space out" every few days.  Headaches are one time a week.  While her mom thought that she was to the eye doctor in June, we called their office and last visit was January.  09/15/16 update: The patient follows up today, accompanied by her mother who supplements the history.  She was scheduled for visual field testing at [facility] on 08/26/2016 but didn't go.    ALLERGIES:   Allergies  Allergen Reactions  . Lupron [Leuprolide Acetate]     Was allergic to a preservative in the Lupron.    CURRENT MEDICATIONS:  Outpatient Encounter Prescriptions as of 09/15/2016  Medication Sig  . escitalopram (LEXAPRO) 10 MG tablet Take 1 tablet (10 mg total) by mouth daily.   No facility-administered encounter medications on file as of 09/15/2016.     PAST MEDICAL HISTORY:   Past Medical History:  Diagnosis Date  . ADHD (attention deficit hyperactivity disorder)   . Precocious puberty    Was followed by endocrinology.    PAST SURGICAL HISTORY:   Past Surgical History:  Procedure Laterality Date  . NO PAST SURGERIES      SOCIAL HISTORY:   Social History   Social History  . Marital status: Single    Spouse name: N/A  . Number of children: 0  . Years of education: N/A   Occupational History  . 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 Smoking  status: Never Smoker  . Smokeless tobacco: Never Used  . Alcohol use No  . Drug use: No  . Sexual activity: Not on file   Other Topics Concern  . Not on file   Social History Narrative   Caffeine Use:  3 daily   Regular Exercise:  2-3 x weekly   Lives with Mom step dad and brother   Attends [employer].   Patient is the granddaughter of [name]             FAMILY HISTORY:  Family Status  Relation Status  . Mother Alive   HTN  . Father Deceased   DM complications  . Brother Alive   3, healthy  . Sister Alive   2, healthy    ROS:    A complete 10 system review of systems was obtained and was unremarkable apart from what is mentioned above.  PHYSICAL EXAMINATION:    VITALS:   There were no vitals filed for this visit. Wt Readings from Last 3 Encounters:  09/13/16 255 lb (115.7 kg) (>99 %, Z > 2.33)*  08/17/16 254 lb (115.2 kg) (>99 %, Z > 2.33)*  07/13/16 257 lb 3.2 oz (116.7 kg) (>99 %, Z > 2.33)*   * Growth percentiles are based on CDC 2-20 Years data.     GEN:  Normal appears female in no acute distress.  Appears stated age. HEENT:  Normocephalic, atraumatic. The mucous membranes are moist. The superficial temporal arteries are without ropiness or tenderness. Cardiovascular: Regular rate and rhythm. Lungs: Clear to auscultation bilaterally. Neck/Heme: There are no carotid bruits noted bilaterally.  NEUROLOGICAL: Orientation:  The patient is alert and oriented x 3.   Cranial nerves: There is good facial symmetry. The pupils are equal round and reactive to light bilaterally. No papilledema noted on my funduscopic exam today.  Extraocular muscles are intact.  Visual fields were full today.    Speech is fluent and clear. Soft palate rises symmetrically and there is no tongue deviation. Hearing is intact to conversational tone. Tone: Tone is good throughout. Sensation: Sensation is intact to light touch throughout Coordination:  The patient has no difficulty with  RAM's or FNF bilaterally. Motor: Strength is 5/5 in the bilateral upper and lower extremities.  Shoulder shrug is equal and symmetric. There is no pronator drift.  There are no fasciculations noted. Gait and Station: The patient is able to ambulate without difficulty but has valgus deformities bilaterally   IMPRESSION/PLAN  1.  Pseudotumor cerebri (benign intracranial hypertension)  -As above, the patient saw her eye doctor on 03/30/2016.  Papilledema was resolved.  Visual fields were not improved, but it was felt that it was low reliability due to fixation losses.  Off of diamox as of May, 2017  -will f/u with her after above as could consider topamax if frequent headaches persist or just try intervention like triptan when gets the headache.  2.  Obesity  -talked about diet

## 2016-09-14 NOTE — Assessment & Plan Note (Signed)
Body mass index is 40.54 kg/m. Will work up to at least 150 minutes of aerobic exercise per week. Patient encouraged to add on resistance training. Discussed need for regular intake to promote good nutrient intake and metabolism. Discussed that abstaining from meals will only cause body to store intake as fat.  Will work on Transport plannergetting Belviq approved. Need to avoid stimulant if possible 2/2 pseudotumor.

## 2016-09-14 NOTE — Assessment & Plan Note (Signed)
Will restart Lexapro at previous dose. Discussed counseling. Declines at present. Follow-up 1 month.

## 2016-09-15 ENCOUNTER — Other Ambulatory Visit: Payer: Self-pay | Admitting: Physician Assistant

## 2016-09-15 ENCOUNTER — Telehealth: Payer: Self-pay | Admitting: Neurology

## 2016-09-15 ENCOUNTER — Ambulatory Visit: Payer: BLUE CROSS/BLUE SHIELD | Admitting: Neurology

## 2016-09-15 DIAGNOSIS — G932 Benign intracranial hypertension: Secondary | ICD-10-CM

## 2016-09-15 NOTE — Telephone Encounter (Signed)
LMOM for patient to call back. Need to cancel appt since visual field testing not complete. Awaiting call back.

## 2016-09-18 ENCOUNTER — Telehealth: Payer: Self-pay | Admitting: *Deleted

## 2016-09-18 ENCOUNTER — Encounter: Payer: Self-pay | Admitting: Physician Assistant

## 2016-09-18 MED ORDER — LORCASERIN HCL 10 MG PO TABS: 10.0000 mg | ORAL_TABLET | Freq: Two times a day (BID) | ORAL | 0 refills | Status: DC

## 2016-09-18 NOTE — Telephone Encounter (Signed)
Received Approval for Belviq, Rx printed for provider signature & faxed to pharmacy; [facility] and number for return call RE: new Rx will be sent to pharmacy and to scheduling 1-Mth F/U appointment as directed by provider/SLS 10/23

## 2016-09-20 ENCOUNTER — Telehealth: Payer: Self-pay | Admitting: Physician Assistant

## 2016-09-20 NOTE — Telephone Encounter (Signed)
Any advice prior to calling patient back/SLS 10/25  Instructions Please take the Lexapro as directed. I will work on getting the [employer] approved.  Continue with exercise. Increase to at least 150 minutes per week. Eat healthy meals with good vegetable portions. Lean protein like chicken or fish are best options.   Obesity Body mass index is 40.54 kg/m. Will work up to at least 150 minutes of aerobic exercise per week. Patient encouraged to add on resistance training. Discussed need for regular intake to promote good nutrient intake and metabolism. Discussed that abstaining from meals will only cause body to store intake as fat.  Will work on Transport plannergetting Belviq approved. Need to avoid stimulant if possible 2/2 pseudotumor.

## 2016-09-20 NOTE — Telephone Encounter (Signed)
Pt called in requesting a call back in regards to her diet plan. Pt says that that she just have a few questions and want to speak with CMA directly when possible.   CB: [phone]

## 2016-09-21 NOTE — Telephone Encounter (Signed)
Absolutely. Have them try that first.

## 2016-09-21 NOTE — Telephone Encounter (Signed)
Called pt, Belviq is too expensive for her. Cost is about $300 for 1 month supply. I found Belviq savings card. Okay to file at front desk for pt to pick up?

## 2016-09-21 NOTE — Telephone Encounter (Signed)
Please find out more information as Annette Molina and I are out of office. Thank you.  Patient was encouraged to not skip meals, eat a healthy meal or small snack every 4 hours to keep metabolism going.

## 2016-09-21 NOTE — Telephone Encounter (Signed)
Called patient and left message to return call. Savings card filed at front desk for pick up.

## 2016-09-22 NOTE — Telephone Encounter (Signed)
Patient notified that savings cards is ready for pick up at front desk.

## 2016-10-07 ENCOUNTER — Encounter: Payer: Self-pay | Admitting: Family Medicine

## 2016-10-07 ENCOUNTER — Ambulatory Visit (INDEPENDENT_AMBULATORY_CARE_PROVIDER_SITE_OTHER): Payer: BLUE CROSS/BLUE SHIELD | Admitting: Family Medicine

## 2016-10-07 VITALS — BP 118/76 | HR 80 | Temp 98.1°F | Resp 16 | Ht 67.0 in | Wt 254.2 lb

## 2016-10-07 DIAGNOSIS — J329 Chronic sinusitis, unspecified: Secondary | ICD-10-CM | POA: Diagnosis not present

## 2016-10-07 DIAGNOSIS — R5383 Other fatigue: Secondary | ICD-10-CM

## 2016-10-07 MED ORDER — AMOXICILLIN-POT CLAVULANATE 875-125 MG PO TABS: 1.0000 | ORAL_TABLET | Freq: Two times a day (BID) | ORAL | 0 refills | Status: DC

## 2016-10-07 NOTE — Progress Notes (Signed)
Subjective:     Patient ID: Annette Molina, female   DOB: 06/08/1996, 20 y.o.   MRN: [id number]  HPI Patient seen Saturday clinic with onset of increased malaise around May of this past year. She states she's had relatively constant sinus congestion. She has in recent weeks if not months developed more fatigue and frequent headaches. She's had frequent bilateral maxillary and frontal sinus pressure and greenish nasal discharge past several days. She has occasional upper teeth pain. She's tried multiple over-the-counter allergy medications including Flonase and Claritin without relief. Denies any cough. No wheezing. No fevers or chills.  Past Medical History:  Diagnosis Date  . ADHD (attention deficit hyperactivity disorder)   . Precocious puberty    Was followed by endocrinology.   Past Surgical History:  Procedure Laterality Date  . NO PAST SURGERIES      reports that she has never smoked. She has never used smokeless tobacco. She reports that she does not drink alcohol or use drugs. family history includes Diabetes in her father; Heart disease in her father; Hypertension in her mother. Allergies  Allergen Reactions  . Lupron [Leuprolide Acetate]     Was allergic to a preservative in the Lupron.     Review of Systems  Constitutional: Positive for fatigue. Negative for chills and fever.  HENT: Positive for congestion and sinus pain. Negative for nosebleeds.   Respiratory: Negative for cough and wheezing.        Objective:   Physical Exam  Constitutional: She appears well-developed and well-nourished.  HENT:  Right Ear: External ear normal.  Left Ear: External ear normal.  Mouth/Throat: Oropharynx is clear and moist.  Neck: Neck supple.  Cardiovascular: Normal rate and regular rhythm.   Pulmonary/Chest: Effort normal and breath sounds normal. No respiratory distress. She has no wheezes. She has no rales.  Lymphadenopathy:    She has no cervical adenopathy.        Assessment:     Patient presents with chronic symptoms of fatigue, intermittent headaches, rhinitis and recent symptoms of upper teeth pain suggesting possible chronic sinusitis    Plan:     -Augmentin 875 mg twice daily for 10 days. -Stay well-hydrated and consider over-the-counter Mucinex -Follow-up with primary if not improved in 2 weeks -follow up with primary for further evaluation of her fatigue if not improved with the above.  Kristian CoveyBruce W Brewer Hitchman MD [facility]

## 2016-10-07 NOTE — Patient Instructions (Signed)

## 2016-10-07 NOTE — Progress Notes (Signed)
Pre visit review using our clinic review tool, if applicable. No additional management support is needed unless otherwise documented below in the visit note. 

## 2016-10-25 ENCOUNTER — Telehealth: Payer: Self-pay | Admitting: Physician Assistant

## 2016-10-25 NOTE — Telephone Encounter (Signed)
Caller name:Bradby,Lakeahia Relation to ZO:XWRUEApt:mother  Call back [phone]   Reason for call:  Mother states specialist is requesting records please advise  [facility] [place], ph# [phone], fax# [phone], waiting for appt

## 2016-10-26 NOTE — Telephone Encounter (Signed)
Records were faxed on 09/15/16, called  Mother, phone rings busy

## 2016-11-08 ENCOUNTER — Other Ambulatory Visit: Payer: Self-pay | Admitting: Family

## 2016-11-08 ENCOUNTER — Other Ambulatory Visit: Payer: Self-pay | Admitting: Physician Assistant

## 2016-11-10 ENCOUNTER — Encounter: Payer: Self-pay | Admitting: Physician Assistant

## 2016-11-10 ENCOUNTER — Ambulatory Visit (INDEPENDENT_AMBULATORY_CARE_PROVIDER_SITE_OTHER): Payer: BLUE CROSS/BLUE SHIELD | Admitting: Physician Assistant

## 2016-11-10 ENCOUNTER — Telehealth: Payer: Self-pay | Admitting: *Deleted

## 2016-11-10 VITALS — BP 110/64 | HR 84 | Temp 97.7°F | Resp 16 | Ht 66.5 in | Wt 256.0 lb

## 2016-11-10 DIAGNOSIS — IMO0001 Reserved for inherently not codable concepts without codable children: Secondary | ICD-10-CM

## 2016-11-10 DIAGNOSIS — R4586 Emotional lability: Secondary | ICD-10-CM

## 2016-11-10 DIAGNOSIS — F39 Unspecified mood [affective] disorder: Secondary | ICD-10-CM

## 2016-11-10 DIAGNOSIS — E6609 Other obesity due to excess calories: Secondary | ICD-10-CM

## 2016-11-10 MED ORDER — LORCASERIN HCL 10 MG PO TABS: ORAL_TABLET | ORAL | 1 refills | Status: DC

## 2016-11-10 MED ORDER — ESCITALOPRAM OXALATE 10 MG PO TABS: 10.0000 mg | ORAL_TABLET | Freq: Every day | ORAL | 1 refills | Status: DC

## 2016-11-10 NOTE — Assessment & Plan Note (Signed)
Body mass index is 40.7 kg/m. Patient to get coupon and start Belviq.  Discussed appropriate diet and exercise regimen. Referral to Nutrition placed.  FU scheduled.

## 2016-11-10 NOTE — Assessment & Plan Note (Signed)
Doing well on Lexapro. Will continue as directed. Medication refilled. FU 6 months for mood. FU 2 months as discussed for Obesity.

## 2016-11-10 NOTE — Telephone Encounter (Signed)
Advised patient mother that [facility] [address]

## 2016-11-10 NOTE — Progress Notes (Signed)
   Patient presents to clinic today c/o for follow-up of mood disorder. Patient was restarted on Lexapro 10 mg at last visit.  Endorses taking as directed. Noted improvement in mood with medication. Notes less irritability and better focus. Denies side effects of medication. Denies SI/HI. Is in need of refills.   We also attempted to start Belviq to help with weight loss giving patient's obesity coupled with history of pseudotumor cerebri. Patient states she never picked up the coupon [facility] and has not started medication. Notes weight increase since last visit. Is only exercising 30 minutes 1-2 x week. Endorses very poor diet, noting eating when she is bored and not working.   Past Medical History:  Diagnosis Date  . ADHD (attention deficit hyperactivity disorder)   . Precocious puberty    Was followed by endocrinology.    No current outpatient prescriptions on file prior to visit.   No current facility-administered medications on file prior to visit.     Allergies  Allergen Reactions  . Lupron [Leuprolide Acetate]     Was allergic to a preservative in the Lupron.    Family History  Problem Relation Age of Onset  . Hypertension Mother   . Diabetes Father   . Heart disease Father   . Cancer Neg Hx     Social History   Social History  . Marital status: Single    Spouse name: N/A  . Number of children: 0  . Years of education: N/A   Occupational History  . 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 Smoking status: Never Smoker  . Smokeless tobacco: Never Used  . Alcohol use No  . Drug use: No  . Sexual activity: Not Asked   Other Topics Concern  . None   Social History Narrative   Caffeine Use:  3 daily   Regular Exercise:  2-3 x weekly   Lives with Mom step dad and brother   Attends [employer].   Patient is the granddaughter of [name]            Review of Systems - See HPI.  All other ROS are negative.  BP  110/64   Pulse 84   Temp 97.7 F (36.5 C) (Oral)   Resp 16   Ht 5' 6.5" (1.689 m)   Wt 256 lb (116.1 kg)   SpO2 99%   BMI 40.70 kg/m   Physical Exam  Constitutional: She is oriented to person, place, and time and well-developed, well-nourished, and in no distress.  HENT:  Head: Normocephalic and atraumatic.  Eyes: Conjunctivae are normal. Pupils are equal, round, and reactive to light.  Neck: Neck supple.  Cardiovascular: Normal rate, regular rhythm, normal heart sounds and intact distal pulses.   Pulmonary/Chest: Effort normal and breath sounds normal. No respiratory distress. She has no wheezes. She has no rales. She exhibits no tenderness.  Neurological: She is alert and oriented to person, place, and time.  Skin: Skin is warm and dry. No rash noted.  Psychiatric: Affect normal.  Vitals reviewed.  Assessment/Plan: Obesity Body mass index is 40.7 kg/m. Patient to get coupon and start Belviq.  Discussed appropriate diet and exercise regimen. Referral to Nutrition placed.  FU scheduled.   Mood swings Doing well on Lexapro. Will continue as directed. Medication refilled. FU 6 months for mood. FU 2 months as discussed for Obesity.    [name], Jaykub Mackins Cody, PA-C

## 2016-11-10 NOTE — Patient Instructions (Addendum)
Please continue the Lexapro as directed. I am glad it is working well for you.   Please go to [email or web address] to print out a voucher card for the Belviq. That way we can start as directed. You are not getting enough exercise in -- need 150 minutes each week of moderate-intensity aerobic exercise.  Also we need to work on cutting back calories. Please download the MyFitnessPal App to start tracking calories. This will help you calculate daily calorie needs.  I am setting you up with a Nutritionist.   Follow-up with me in 1-2 months for reassessment of weight.

## 2016-11-10 NOTE — Progress Notes (Signed)
Pre visit review using our clinic review tool, if applicable. No additional management support is needed unless otherwise documented below in the visit note. 

## 2016-11-10 NOTE — Telephone Encounter (Signed)
Patient's mother called  and said that Annet[facility] Molina had previously [facility]  [employer] and they could not [facility] it.  They got the coupon for it today and went to the pharmacy and they no longer have the RX.  She is asking if there is anyway it can be sent in again.

## 2016-11-10 NOTE — Addendum Note (Signed)
Addended by: Marcelline MatesMARTIN, Shunte Senseney on: 11/10/2016 03:49 PM   Modules accepted: Orders

## 2016-11-10 NOTE — Telephone Encounter (Signed)
Cannot send electronically and fax machine is down.  I have placed order. Please phone in the Rx for patient.

## 2016-12-21 ENCOUNTER — Ambulatory Visit (INDEPENDENT_AMBULATORY_CARE_PROVIDER_SITE_OTHER): Payer: BLUE CROSS/BLUE SHIELD | Admitting: Medical

## 2016-12-21 VITALS — BP 115/89 | HR 81 | Temp 98.5°F | Wt 260.0 lb

## 2016-12-21 DIAGNOSIS — R002 Palpitations: Secondary | ICD-10-CM | POA: Diagnosis not present

## 2016-12-21 DIAGNOSIS — M791 Myalgia, unspecified site: Secondary | ICD-10-CM

## 2016-12-21 DIAGNOSIS — J029 Acute pharyngitis, unspecified: Secondary | ICD-10-CM | POA: Diagnosis not present

## 2016-12-21 LAB — POC INFLUENZA A&B (BINAX/QUICKVUE)
INFLUENZA B, POC: NEGATIVE
Influenza A, POC: NEGATIVE

## 2016-12-21 LAB — POCT RAPID STREP A (OFFICE): RAPID STREP A SCREEN: POSITIVE — AB

## 2016-12-21 MED ORDER — FLUTICASONE PROPIONATE 50 MCG/ACT NA SUSP: 2.0000 | Freq: Every day | NASAL | 1 refills | Status: DC

## 2016-12-21 MED ORDER — AMOXICILLIN-POT CLAVULANATE 875-125 MG PO TABS
1.0000 | ORAL_TABLET | Freq: Two times a day (BID) | ORAL | 0 refills | Status: DC
Start: 2016-12-21 — End: 2017-02-12

## 2016-12-21 NOTE — Patient Instructions (Signed)
For strep throat and possible sinus infection rx augmentin. Possible hx of allergies so rx flonase.  Will see if flonase can help relieve sinus pressure.  Your ekg is normal and your pulse is normal today. Stay hydrated and avoid caffeine beverage or otc decongestants. If mom can get o2 sat monitor  otc and check pulse that would be helpful. If pulse increases over 100 or persisting palpitation then can get holter monitor. Any severe signs or symptoms after hours then ED eval.  Follow up in 2-3 weeks or as needed

## 2016-12-21 NOTE — Progress Notes (Signed)
Subjective:    Patient ID: Annette Molina, female    DOB: 11/12/1996, 21 y.o.   MRN: [id number]  HPI  Sinus pressure for one month or more feels very congested.   Told has repeated sinus infections in the past. No recent antibiotics. Pt mom thinks she has allergies. Hx sneezing moderate. Mom thinks allergic rhinitis may be precipitating sinus infection.  Pt throat has been hurting about 4 days. Mild on and off body aches. 4 days ago when she would swallow reported moderate pain.    Review of Systems  Constitutional: Positive for fatigue. Negative for chills and fever.  HENT: Positive for congestion, sinus pain and sore throat.   Respiratory: Negative for cough, chest tightness, shortness of breath and wheezing.   Cardiovascular: Positive for palpitations. Negative for chest pain.       Pt recently had subjective fast heart rate. When laying down at night.  Gastrointestinal: Negative for abdominal pain.  Genitourinary: Negative for dysuria and flank pain.  Musculoskeletal:       Mild body aches.   Skin: Negative for rash.  Neurological: Negative for dizziness, seizures, syncope, weakness, light-headedness and headaches.  Hematological: Negative for adenopathy. Does not bruise/bleed easily.   Past Medical History:  Diagnosis Date  . ADHD (attention deficit hyperactivity disorder)   . Precocious puberty    Was followed by endocrinology.     Social History   Social History  . Marital status: Single    Spouse name: N/A  . Number of children: 0  . Years of education: N/A   Occupational History  . 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 Smoking status: Never Smoker  . Smokeless tobacco: Never Used  . Alcohol use No  . Drug use: No  . Sexual activity: Not on file   Other Topics Concern  . Not on file   Social History Narrative   Caffeine Use:  3 daily   Regular Exercise:  2-3 x weekly   Lives with Mom step dad and brother   Attends  [employer].   Patient is the granddaughter of [name]             Past Surgical History:  Procedure Laterality Date  . NO PAST SURGERIES      Family History  Problem Relation Age of Onset  . Hypertension Mother   . Diabetes Father   . Heart disease Father   . Cancer Neg Hx     Allergies  Allergen Reactions  . Lupron [Leuprolide Acetate]     Was allergic to a preservative in the Lupron.    Current Outpatient Prescriptions on File Prior to Visit  Medication Sig Dispense Refill  . escitalopram (LEXAPRO) 10 MG tablet Take 1 tablet (10 mg total) by mouth daily. 90 tablet 1  . Lorcaserin HCl (BELVIQ) 10 MG TABS Take 1 tablet by mouth daily x 1 week. Then 1 tablet PO BID (Patient not taking: Reported on 12/21/2016) 60 tablet 1   No current facility-administered medications on file prior to visit.     BP 115/89 (BP Location: Left Arm, Patient Position: Sitting, Cuff Size: Large)   Pulse 81   Temp 98.5 F (36.9 C) (Oral)   Wt 260 lb (117.9 kg)   SpO2 100%   BMI 41.34 kg/m       Objective:   Physical Exam  General  Mental Status - Alert. General Appearance - Well groomed. Not in acute  distress.  Skin Rashes- No Rashes.  HEENT Head- Normal. Ear Auditory Canal - Left- Normal. Right - Normal.Tympanic Membrane- Left- Normal. Right- Normal. Eye Sclera/Conjunctiva- Left- Normal. Right- Normal. Nose & Sinuses Nasal Mucosa- Left-  Boggy and Congested. Right-  Boggy and  Congested.Bilateral maxillary and frontal sinus pressure. Mouth & Throat Lips: Upper Lip- Normal: no dryness, cracking, pallor, cyanosis, or vesicular eruption. Lower Lip-Normal: no dryness, cracking, pallor, cyanosis or vesicular eruption. Buccal Mucosa- Bilateral- No Aphthous ulcers. Oropharynx- No Discharge or Erythema. Tonsils: Characteristics- Bilateral- mild  Erythema or Congestion. Size/Enlargement- Bilateral- 1+ enlargement. Discharge- bilateral-None.  Neck Neck- Supple. No Masses. Faint  enlarged lymph node but not tender. No jvd. No tracheal deviation.   Chest and Lung Exam Auscultation: Breath Sounds:-Clear even and unlabored.  Cardiovascular Auscultation:Rythm- Regular, rate and rhythm.(llying supine pt feels hr increase but on o2 monitor pulse in 70-80 range. No tachycardia. Her o2 sat was 98%. Murmurs & Other Heart Sounds:Ausculatation of the heart reveal- No Murmurs.  Lymphatic Head & Neck General Head & Neck Lymphatics: Bilateral: Description- see neck exam.  Abdomen- soft, nd, nt, +bs, no rebound or guarding       Assessment & Plan:  ekg in office- showed nsr. Probable normal for her age on review.(v1 and v2 leads)   For strep throat and possible sinus infection rx augmentin. Possible hx of allergies so rx flonase.  Will see if flonase can help relieve sinus pressure.  Your ekg is normal and your pulse is normal today. Stay hydrated and avoid caffeine beverage or otc decongestants. If mom can get o2 sat monitor  otc and check pulse that would be helpful. If pulse increases over 100 or persisting palpitation then can get holter monitor. Any severe signs or symptoms after hours then ED eval.  Follow up in 2-3 weeks or as needed  Aizen Duval, Ramon Dredge, [employer]

## 2016-12-21 NOTE — Progress Notes (Signed)
Pre visit review using our clinic review tool, if applicable. No additional management support is needed unless otherwise documented below in the visit note. 

## 2016-12-22 IMAGING — CR DG ELBOW COMPLETE 3+V*R*
4 series · 4 of 4 positions shown · non-contrast
Comparison: 11/11/2009.

CLINICAL DATA: Elbow pain for 3 days after bowling.

EXAM:
RIGHT ELBOW - COMPLETE 3+ VIEW

[x elbow joint ap right]
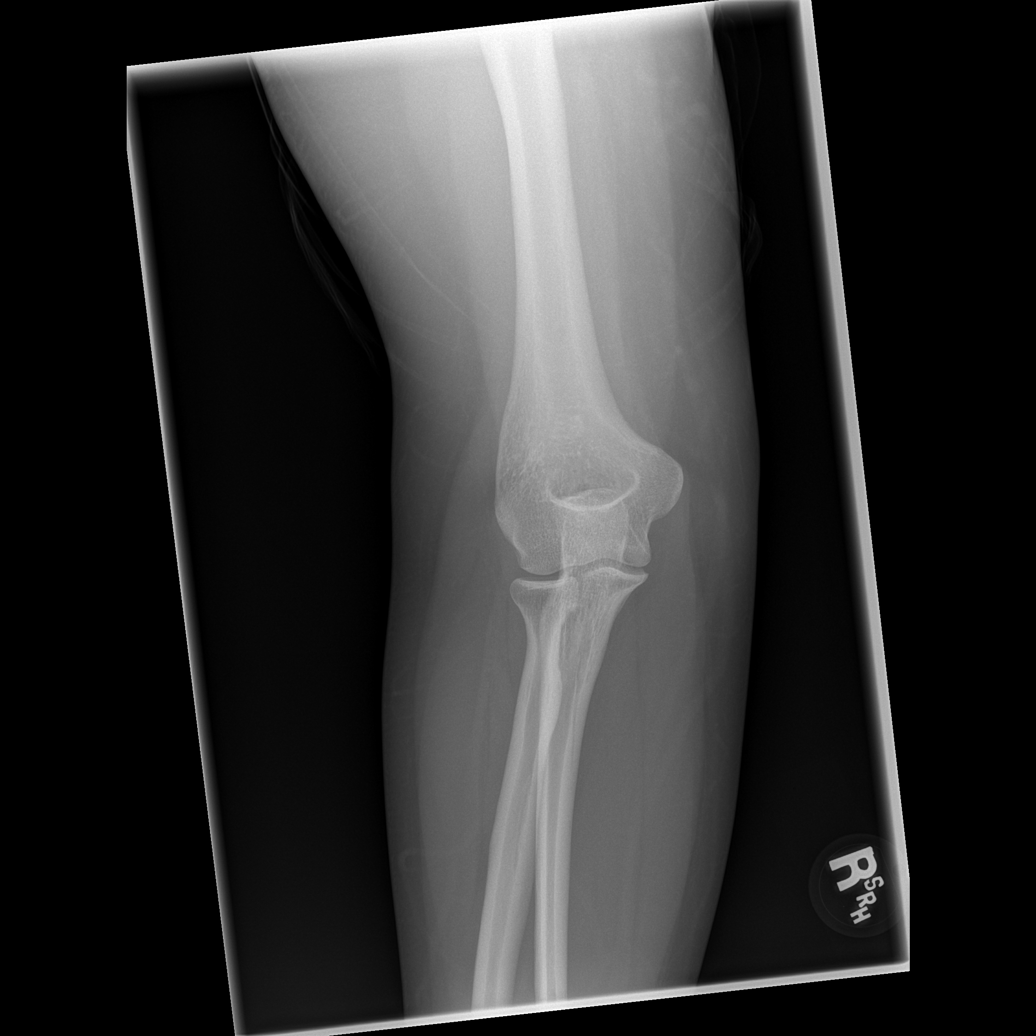

[x elbow joint obl. right (1 of 2)]
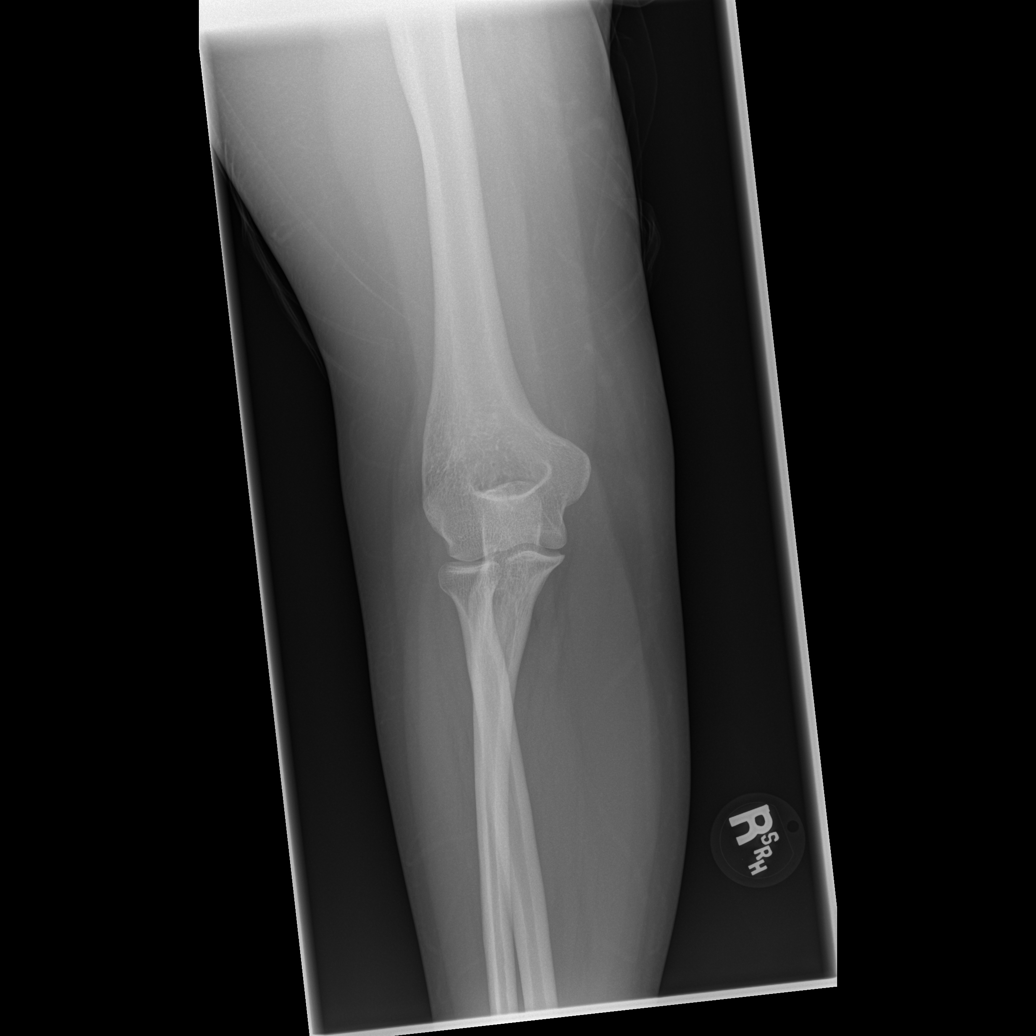

[x elbow joint obl. right (2 of 2)]
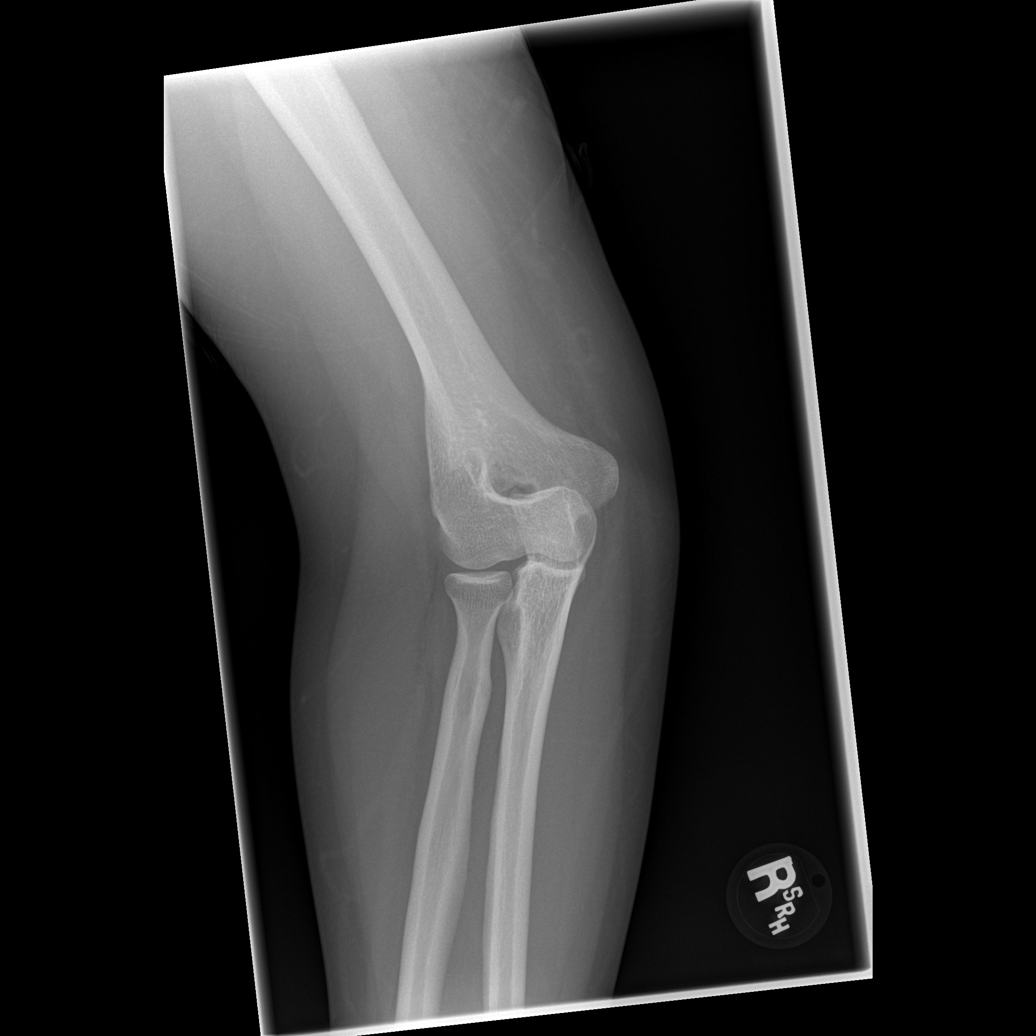

[x elbow joint lat right]
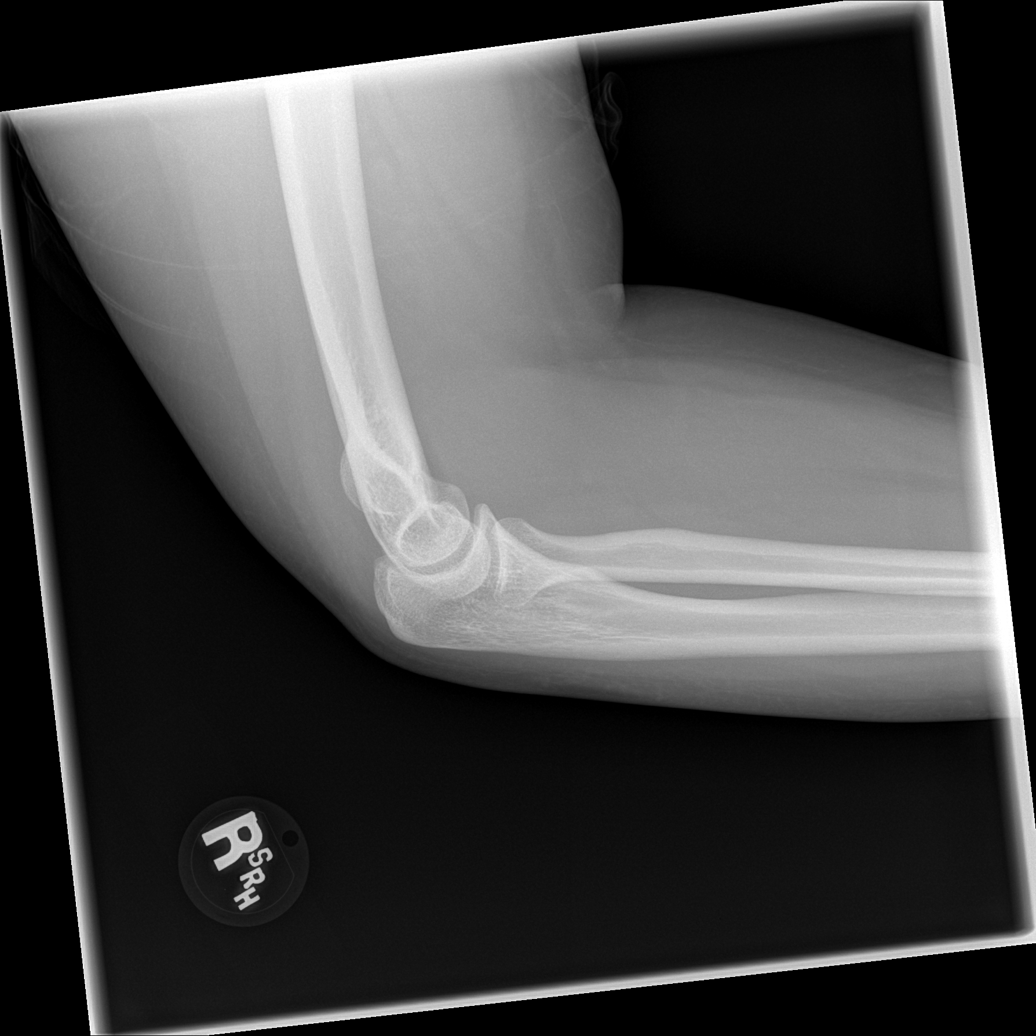

[4 of 4 positions shown; findings below may reference images not displayed]

FINDINGS: No acute bony or joint abnormality identified. No evidence of
fracture or dislocation.
IMPRESSION: No acute abnormality.  No interim change from prior exam.

## 2017-01-08 ENCOUNTER — Telehealth: Payer: Self-pay | Admitting: Physician Assistant

## 2017-01-08 NOTE — Telephone Encounter (Signed)
[facility] - Client TELEPHONE ADVICE RECORD [facility]  Patient Name: Annette Molina  DOB: 09/11/1996   [facility] Initial Comment Caller states felt like she was having a seizure or anxiety attack; currently still having this feeling. Says she is shaking and having chest pains. Is concerned. Wanting to know what she needs to do.    Nurse Assessment  Nurse: Laural BenesJohnson, RN, Dondra SpryGail Date/Time [name](Eastern Time): 01/08/2017 5:11:16 PM  Confirm and document reason for call. If symptomatic, describe symptoms. ---[name] driving back home grandmother noted her twitching and jerking all the while talking coherently to her; but then started speeding up and acting different -- [name] felt tired but did not feel she had acted so out of character  Does the patient have any new or worsening symptoms? ---Yes  Will a triage be completed? ---Yes  Related visit to physician within the last 2 weeks? ---No  Does the PT have any chronic conditions? (i.e. diabetes, asthma, etc.) ---Yes  List chronic conditions. ---Pseudo tumors cerebral  Is the patient pregnant or possibly pregnant? (Ask all females between the ages of 6512-55) ---No  Is this a behavioral health or substance abuse call? ---No     Guidelines    Guideline Title Affirmed Question Affirmed Notes  Seizure Stopped taking seizure medicines (anticonvulsants)    Final Disposition User   See Physician within 24 Hours Laural BenesJohnson, RN, Dondra SpryGail    Comments  note; 2/13-2018 200pm with [name] no availability for her provider.   Referrals  [facility]   Disagree/Comply: Comply

## 2017-01-09 ENCOUNTER — Telehealth: Payer: Self-pay | Admitting: Physician Assistant

## 2017-01-09 ENCOUNTER — Telehealth: Payer: Self-pay

## 2017-01-09 ENCOUNTER — Ambulatory Visit: Payer: Self-pay | Admitting: Internal Medicine

## 2017-01-09 NOTE — Telephone Encounter (Signed)
alled patient back. States she spoke with her mother who spoke on the phone with Nurse at Neurologists office who states it sounded like stress and appointment was made for her to be seen tomorrow. Advised patient if she started to have shortness of breath or chest pain to go strait to ED. Patient agreed.

## 2017-01-09 NOTE — Telephone Encounter (Signed)
No, thx 

## 2017-01-09 NOTE — Telephone Encounter (Signed)
Called patient follow up on phone call to [facility]. Patient states she was told by one Nurse to go to Ed then she was told by another nurse that she did not need to go. Patient had appointment scheduled for today at 2:00 with Dr. Drue NovelPaz. Father called in and cancelled the appointment at 8:30 this am he stated that appointment had been made with her  Neurologist. Asked patient how she was feeling now, states she feels shaky and tremulousness could be heard in her voice.  Patient not sure where she will be going. States she will call her mother to find out.

## 2017-01-09 NOTE — Telephone Encounter (Signed)
Father lvm today at 8:30am cancelling 2pm appointment for today due to patient seeing a neurologist, charge or no charge

## 2017-01-15 ENCOUNTER — Telehealth: Payer: Self-pay | Admitting: Physician Assistant

## 2017-01-15 ENCOUNTER — Ambulatory Visit: Payer: Self-pay | Admitting: Family

## 2017-01-15 NOTE — Telephone Encounter (Signed)
Pt called in because she said that she has an appt to neuro today at the same time as appt with PCP. She says that she will not be at her appt.

## 2017-01-15 NOTE — Telephone Encounter (Signed)
No charge. 

## 2017-01-15 NOTE — Telephone Encounter (Signed)
Could you please mark her apt a no show?

## 2017-02-08 NOTE — Progress Notes (Signed)
Annette Molina was seen today in neurologic consultation at the request of [name], William Cody, PA-C.  The patient is seen today in neurologic consultation.  She is currently by her mother who helps to supplement the history.  I have reviewed prior records made available to me.  The patient reports that she is been having headaches since December (patient states December but mom states perhaps over the last few months).   She reports that headaches are unusual for her.   Headaches are located in the frontal region.  Headaches are described as throbbing and an annoying ache.  She has had vision changes with the headaches, described as blurry.  She has had no phonophobia but has had photophobia.  She did see the eye doctor this past Thursday for a routine Thursday for a routine visit for contacts and was told that she had swelling bilaterally.  She was told that she couldn't wear contacts right now.  She ended up going to the emergency room for dizziness on 12/11/2015 and mentioned this issue and subsequently had a CT of the brain.  It was reported to show a mildly prominent empty sella but it was otherwise unremarkable.  She was started on 25 mg of Topamax daily.  She was told to schedule outpatient follow-up.  She takes Lexapro and is supposed to be on methylphenidate patch but isn't taking that.  She occasionally takes excedrin but no other supplements.  She is not on birth control.  She has no IUD's.    12/24/15 update:  The patient follows up today, accompanied by her mother who supplements the history.  She underwent a lumbar puncture on 12/15/2015.  Opening pressure was reported to be 300 mm of water (but it was measured in the left lateral decubitus position and it does not appear that legs were extended).  She had an MRI of the brain and MRV of the brain since our last visit.  There is evidence of a partially empty sella with narrowing of the distal aspects of the transverse sinus, but no  evidence of dural venous sinus thrombosis.  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  I did send her for visual field testing and there was evidence of superior quadrant defect on the right.  Mild scattered defects on the left.  She hasn't had headache since the LP  03/23/16 update:  The patient follows up today, accompanied by her mother who supplements the history.  Patient has a history of pseudotumor cerebri.  She started Diamox and is on 500 mg twice per day.  She is faithful with it.  Last visit, I stressed the importance of diet and exercise, with the goal of weight loss.  Unfortunately, she really has not lost a significant amount of weight but she has lost 9 lbs since Jan.  She went to her primary care provider at the end of March and talked to him about weight loss and he talked to her about proper diet as well.  His notes did mention that he did not want to put her on any medication for at least a month.  She was supposed to have lab works for me and called me on March 20 with a headache and I asked her about the lab work.  Her mother stated that she did not know she was supposed to have lab work, so  I reminded her.  She went to the lab but they didn't draw that lab.  She does state that the headaches have come back in the L more than R temporal region.  She has dizziness with it.  She states it is a "pushing pain."  She is getting stuffed up with it.    04/21/16 update:  The patient follows up today, accompanied by her mother who supplements the history.  Patient has a history of pseudotumor cerebri.  She is on Diamox and is on 500 mg twice per day. She saw her eye doctor at [facility] on 02/29/2016.  There was resolved papilledema bilaterally with distinct disc margins.  It stated that visual fields were not particularly improved, but low reliability due to  fixation losses.  When asked about headache, she states that she thinks that it is from not wearing her glasses during the day.  If she wears her glasses, there is no headache.  States that never got call with nutritionist.    08/17/16 update:  The patient follows up today, accompanied by her mother who supplements the history.  She was taken off of Diamox last visit, as papilledema had resolved.  She reports "everything is good."   Then her mom told her that "you need to tell the truth."  Then patient states that she has headaches.  Headaches are over the L temple and feels that it is "like needles."  Mom says she seems to "space out" every few days.  Headaches are one time a week.  While her mom thought that she was to the eye doctor in June, we called their office and last visit was January.  02/12/17 update:  Patient follows up today, accompanied by her mother who supplements the history.  She had an appointment in October, but did not come to that.  She has no showed several appointments with her primary care physicians as well.  I had made her an appointment with [facility] for visual field testing on 08/26/2016 but she did not go to that. She brings records today and she went to a new eye doctor at eye care center in [place] on 02/08/17.  The left eye was normal.  The right was slightly abnormal (looks like enlarged blind spot) but since new eye doctor could not compare to previous.  Notes indicate that patient stopped half way through the right because her head hurt.  She has a f/u with them in a week.   Multiple phone calls from her parents to the PCP indicate that the patient has seen another neurologist but there are no notes.  Pt states that she hasn't been to another neurologist.  She denies any headaches.  Pts mother reports she has had dizzy spells but patient denies it.  Pt states that it happened one time since our last visit but patients mom indicates initially one time a week and then says a  few times.  It is an off balance but not a spinning sensation.  It happens when she gets up quickly.  She only drinks one bottle of water per day.  She usually drinks juice, tea or coke.      ALLERGIES:   Allergies  Allergen Reactions  . Lupron [Leuprolide Acetate]     Was allergic to a preservative in the Lupron.    CURRENT MEDICATIONS:  Outpatient Encounter Prescriptions as of 02/12/2017  Medication Sig  . escitalopram (LEXAPRO) 10 MG tablet Take 10 mg by  mouth daily.  . [DISCONTINUED] escitalopram (LEXAPRO) 10 MG tablet Take 1 tablet (10 mg total) by mouth daily.  . [DISCONTINUED] amoxicillin-clavulanate (AUGMENTIN) 875-125 MG tablet Take 1 tablet by mouth 2 (two) times daily.  . [DISCONTINUED] fluticasone (FLONASE) 50 MCG/ACT nasal spray Place 2 sprays into both nostrils daily.  . [DISCONTINUED] Lorcaserin HCl (BELVIQ) 10 MG TABS Take 1 tablet by mouth daily x 1 week. Then 1 tablet PO BID (Patient not taking: Reported on 12/21/2016)   No facility-administered encounter medications on file as of 02/12/2017.     PAST MEDICAL HISTORY:   Past Medical History:  Diagnosis Date  . ADHD (attention deficit hyperactivity disorder)   . Precocious puberty    Was followed by endocrinology.    PAST SURGICAL HISTORY:   Past Surgical History:  Procedure Laterality Date  . NO PAST SURGERIES      SOCIAL HISTORY:   Social History   Social History  . Marital status: Single    Spouse name: N/A  . Number of children: 0  . Years of education: N/A   Occupational History  . 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 Smoking status: Never Smoker  . Smokeless tobacco: Never Used  . Alcohol use No  . Drug use: No  . Sexual activity: Not on file   Other Topics Concern  . Not on file   Social History Narrative   Caffeine Use:  3 daily   Regular Exercise:  2-3 x weekly   Lives with Mom step dad and brother   Attends [employer].   Patient is the granddaughter of [name]             FAMILY HISTORY:   Family Status  Relation Status  . Mother Alive   HTN  . Father Deceased   DM complications  . Brother Alive   3, healthy  . Sister Alive   2, healthy  . Neg Hx     ROS:    A complete 10 system review of systems was obtained and was unremarkable apart from what is mentioned above.  PHYSICAL EXAMINATION:    VITALS:   Vitals:   02/12/17 0854  BP: 140/90  Pulse: 68  Weight: 248 lb (112.5 kg)  Height: 5\' 6"  (1.676 m)   Wt Readings from Last 3 Encounters:  02/12/17 248 lb (112.5 kg)  12/21/16 260 lb (117.9 kg)  11/10/16 256 lb (116.1 kg) (>99 %, Z= 2.57)*   * Growth percentiles are based on CDC 2-20 Years data.     GEN:  Normal appears female in no acute distress.  Appears stated age. HEENT:  Normocephalic, atraumatic. The mucous membranes are moist. The superficial temporal arteries are without ropiness or tenderness. Cardiovascular: Regular rate and rhythm. Lungs: Clear to auscultation bilaterally. Neck/Heme: There are no carotid bruits noted bilaterally. Skin:  Large scar on forearm (states that glass fell on arm at work and cut it - no stitches required)  NEUROLOGICAL: Orientation:  The patient is alert and oriented x 3.   Cranial nerves: There is good facial symmetry. The pupils are equal round and reactive to light bilaterally. No papilledema noted on my funduscopic exam today.  Extraocular muscles are intact.  Visual fields were full today.    Speech is fluent and clear. Soft palate rises symmetrically and there is no tongue deviation. Hearing is intact to conversational tone. Tone: Tone is good throughout. Sensation: Sensation is intact to  light touch throughout Coordination:  The patient has no difficulty with RAM's or FNF bilaterally. Motor: Strength is 5/5 in the bilateral upper and lower extremities.  Shoulder shrug is equal and symmetric. There is no pronator drift.  There are no fasciculations noted. Gait and Station:  The patient is able to ambulate without difficulty but has valgus deformities bilaterally   IMPRESSION/PLAN  1.  Pseudotumor cerebri (benign intracranial hypertension)  -As above, the patient saw her eye doctor on 02/08/17 but saw a new eye doctor and no comparison could be made.  She quit VF testing on the right so has a repeat exam scheduled in a week.  Testing they brought looked stable and just slightly abnormal on the right and normal on the left.  Asked her to sign release at new eye doctor office so he can compare testing since I cannot release that information as the "middle man."  Mother said that she would.  Pt denies headache or vision issues.  Off of diamox as of May, 2017  -reports no headache  -Talked to the patient again about diet and exercise.  Has lost some weight and I congratulated her  2.  Dizziness  -she really needs to increase water intake.  Talked to her extensively about this.  Told her want her to d/c soda and juice and start 60 oz of water per day.  3.  Follow up in 6 months.  Ask eye doctor to send me his eval next week and f/u sooner if something has chnaged.  Will need to ask eye doctor when she is released for driving (suspect able to drive now).  Much greater than 50% of this visit was spent in counseling and coordinating care.  Total face to face time:  25 min

## 2017-02-12 ENCOUNTER — Ambulatory Visit (INDEPENDENT_AMBULATORY_CARE_PROVIDER_SITE_OTHER): Payer: BLUE CROSS/BLUE SHIELD | Admitting: Neurology

## 2017-02-12 ENCOUNTER — Encounter: Payer: Self-pay | Admitting: Neurology

## 2017-02-12 VITALS — BP 140/90 | HR 68 | Ht 66.0 in | Wt 248.0 lb

## 2017-02-12 DIAGNOSIS — G932 Benign intracranial hypertension: Secondary | ICD-10-CM | POA: Diagnosis not present

## 2017-02-12 DIAGNOSIS — R42 Dizziness and giddiness: Secondary | ICD-10-CM | POA: Diagnosis not present

## 2017-03-31 ENCOUNTER — Ambulatory Visit (INDEPENDENT_AMBULATORY_CARE_PROVIDER_SITE_OTHER): Payer: BLUE CROSS/BLUE SHIELD

## 2017-03-31 ENCOUNTER — Encounter (HOSPITAL_COMMUNITY): Payer: Self-pay | Admitting: *Deleted

## 2017-03-31 ENCOUNTER — Ambulatory Visit (HOSPITAL_COMMUNITY)
Admission: EM | Admit: 2017-03-31 | Discharge: 2017-03-31 | Disposition: A | Discharge status: Home / Self Care | Payer: BLUE CROSS/BLUE SHIELD | Attending: Internal Medicine | Admitting: Internal Medicine

## 2017-03-31 DIAGNOSIS — S93602A Unspecified sprain of left foot, initial encounter: Secondary | ICD-10-CM

## 2017-03-31 DIAGNOSIS — S93402A Sprain of unspecified ligament of left ankle, initial encounter: Secondary | ICD-10-CM

## 2017-03-31 HISTORY — DX: Major depressive disorder, single episode, unspecified: F32.9

## 2017-03-31 HISTORY — DX: Depression, unspecified: F32.A

## 2017-03-31 NOTE — ED Provider Notes (Signed)
CSN: [id number]     Arrival date & time 03/31/17  1935 History   First MD Initiated Contact with Patient 03/31/17 2111     Chief Complaint  Patient presents with  . Ankle Injury   (Consider location/radiation/quality/duration/timing/severity/associated sxs/prior Treatment) 21 year old obese female was wearing high heels and twisted her left ankle about 2 hours ago. She is complaining of pain to the foot and around the medial aspect of the ankle. I believe she is trying to describe an eversion injury but she is not sure.      Past Medical History:  Diagnosis Date  . ADHD (attention deficit hyperactivity disorder)   . Depression   . Precocious puberty    Was followed by endocrinology.   Past Surgical History:  Procedure Laterality Date  . NO PAST SURGERIES     Family History  Problem Relation Age of Onset  . Hypertension Mother   . Diabetes Father   . Heart disease Father   . Cancer Neg Hx    Social History  Substance Use Topics  . Smoking status: Never Smoker  . Smokeless tobacco: Never Used  . Alcohol use No   OB History    No data available     Review of Systems  Constitutional: Negative for activity change, chills and fever.  HENT: Negative.   Respiratory: Negative.   Cardiovascular: Negative.   Musculoskeletal: Negative for joint swelling.       As per HPI  Skin: Negative for color change, pallor and rash.  Neurological: Negative.   All other systems reviewed and are negative.   Allergies  Lupron [leuprolide acetate]  Home Medications   Prior to Admission medications   Medication Sig Start Date End Date Taking? Authorizing Provider  escitalopram (LEXAPRO) 10 MG tablet Take 10 mg by mouth daily.   Yes [provider]   Meds Ordered and Administered this Visit  Medications - No data to display  BP (!) 144/116 (BP Location: Left Arm)   Pulse 85   Temp 97.8 F (36.6 C) (Oral)   Resp 16   LMP 03/20/2017 (Approximate)   SpO2 100%  No data  found.   Physical Exam  Constitutional: She is oriented to person, place, and time. She appears well-developed and well-nourished. No distress.  HENT:  Head: Normocephalic and atraumatic.  Eyes: EOM are normal.  Neck: Neck supple.  Pulmonary/Chest: Effort normal.  Musculoskeletal:  Patient retains her left foot in a position of eversion. She states she is unable to invert the foot to a neutral position. Anywhere that the foot or ankle is touched produces an exaggerated pain response. There is no swelling about the ankle. There is minimal swelling to the lateral foot. No deformity. Sensation is intact. Even lifting her foot off the wheelchair foot rest causes severe pain. X-rays were reviewed and are negative for fracture or dislocation. Normal warmth and color, pedal pulse 2+ but difficult to locate due to too much pain of the skin. No abrasions or open skin lesion.  Neurological: She is alert and oriented to person, place, and time. No cranial nerve deficit.  Skin: Skin is warm and dry.  Psychiatric: Her mood appears anxious. Her affect is labile. She is slowed.  Nursing note and vitals reviewed.   Urgent Care Course     Procedures (including critical care time)  Labs Review Labs Reviewed - No data to display  Imaging Review Dg Ankle Complete Left  Result Date: 03/31/2017 CLINICAL DATA:  Rolled left ankle  and foot, with left ankle pain. Initial encounter. EXAM: LEFT ANKLE COMPLETE - 3+ VIEW COMPARISON:  None. FINDINGS: There is no evidence of fracture or dislocation. The ankle mortise is intact; the interosseous space is within normal limits. No talar tilt or subluxation is seen. The joint spaces are preserved. No significant soft tissue abnormalities are seen. IMPRESSION: No evidence of fracture or dislocation. Electronically Signed   By: Roanna Raider M.D.   On: 03/31/2017 20:49   Dg Foot Complete Left  Result Date: 03/31/2017 CLINICAL DATA:  Stepped on rock and rolled left ankle  and foot, with left foot pain. Initial encounter. EXAM: LEFT FOOT - COMPLETE 3+ VIEW COMPARISON:  None. FINDINGS: There is no evidence of fracture or dislocation. The joint spaces are preserved. There is no evidence of talar subluxation; the subtalar joint is unremarkable in appearance. No significant soft tissue abnormalities are seen. IMPRESSION: No evidence of fracture or dislocation. Electronically Signed   By: Roanna Raider M.D.   On: 03/31/2017 20:50     Visual Acuity Review  Right Eye Distance:   Left Eye Distance:   Bilateral Distance:    Right Eye Near:   Left Eye Near:    Bilateral Near:         MDM   1. Sprain of left ankle, unspecified ligament, initial encounter   2. Foot sprain, left, initial encounter   Rest, elevate, wear the wrap for the next 3-4 days, longer if needed. May remove for showering or other purposes. Apply ice to the foot and ankle off and on for the next couple days. No weightbearing for 3 or 4 days. Use the crutches. After that period of time weightbearing as tolerated.      Hayden Rasmussen, NP 03/31/17 2132

## 2017-03-31 NOTE — ED Triage Notes (Signed)
C/O rolling left ankle while wearing high heels today.  C/O left ankle and left foot pain.  C/O numbness in all LLE toes; cap refill < 3 sec.

## 2017-03-31 NOTE — Discharge Instructions (Signed)
Rest, elevate, wear the wrap for the next 3-4 days, longer if needed. May remove for showering or other purposes. Apply ice to the foot and ankle off and on for the next couple days. No weightbearing for 3 or 4 days. Use the crutches. After that period of time weightbearing as tolerated.

## 2017-04-10 IMAGING — US US EXTREM  UP VENOUS*R*
1 series · 13 of 24 positions shown · non-contrast
Comparison: None.

CLINICAL DATA: Upper extremity pain after hyperextension

EXAM:
RIGHT UPPER EXTREMITY VENOUS DUPLEX ULTRASOUND
TECHNIQUE: Gray-scale sonography with graded compression, as well as color
Doppler and duplex ultrasound were performed to evaluate the right
upper extremity deep venous system from the level of the subclavian
vein and including the jugular, axillary, basilic, radial, ulnar and
upper cephalic vein. Spectral Doppler was utilized to evaluate flow
at rest and with distal augmentation maneuvers.

[Series 1: us extrem up venous*right* · 0.08mm/px · 13 of 31 slices shown]
[im 1/31]
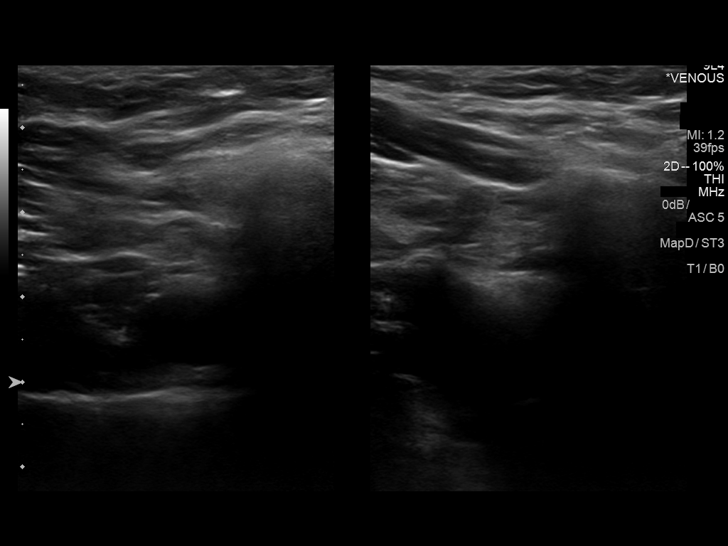
[im 3/31]
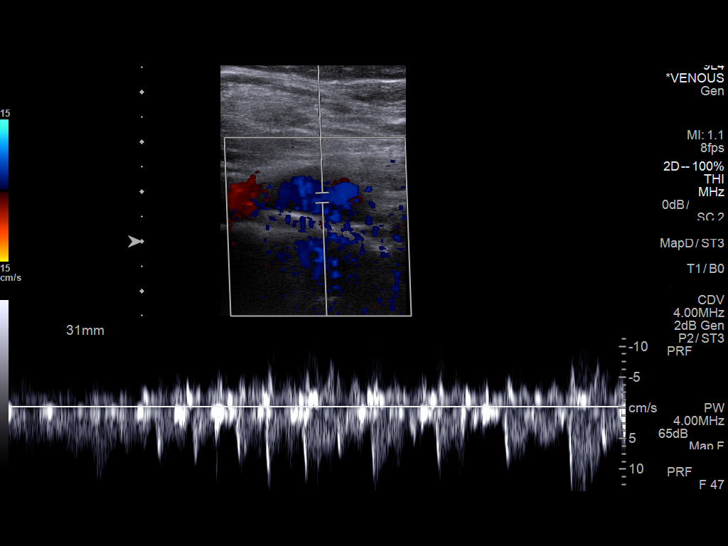
[im 6/31]
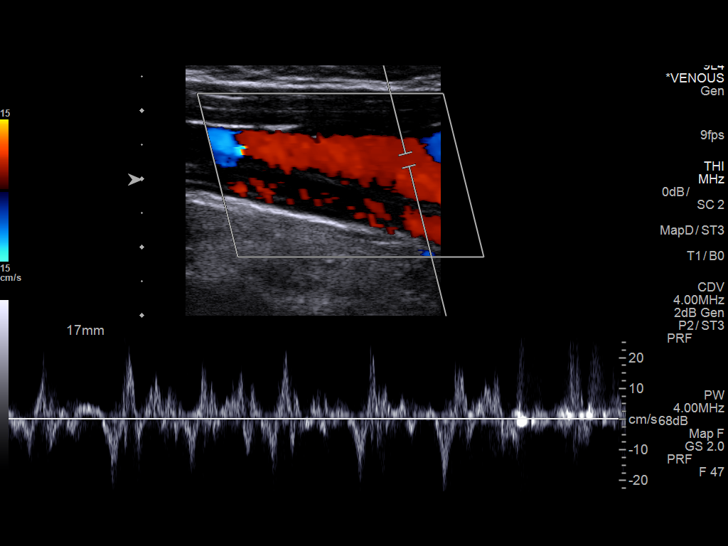
[im 8/31]
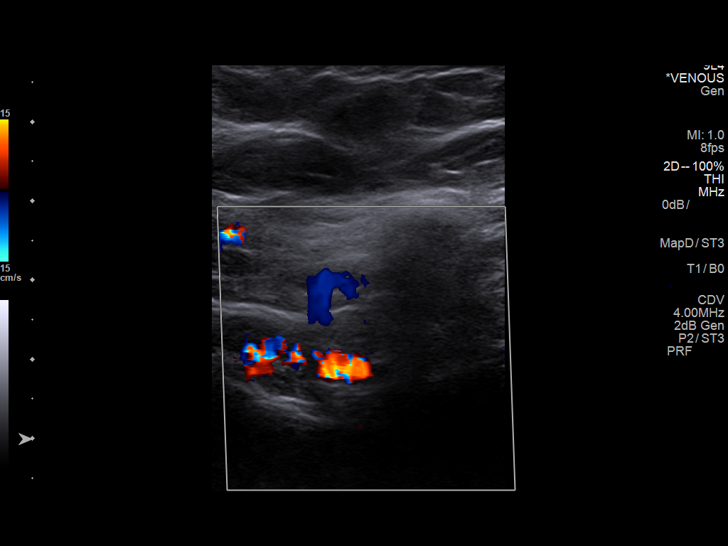
[im 11/31]
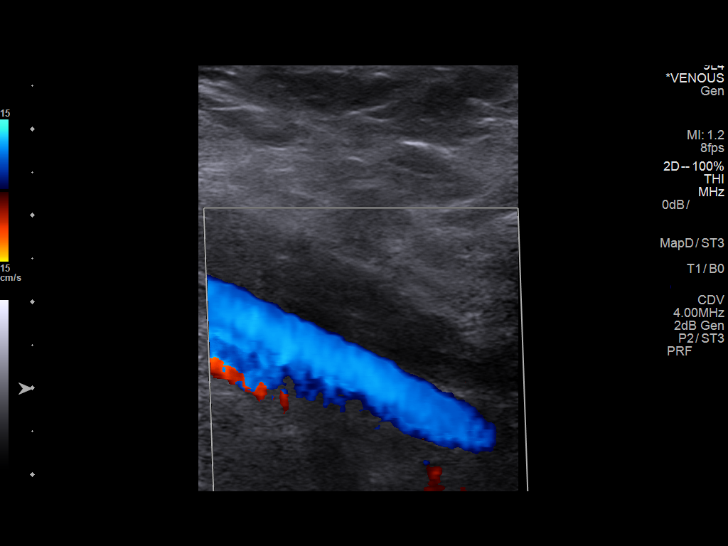
[im 14/31]
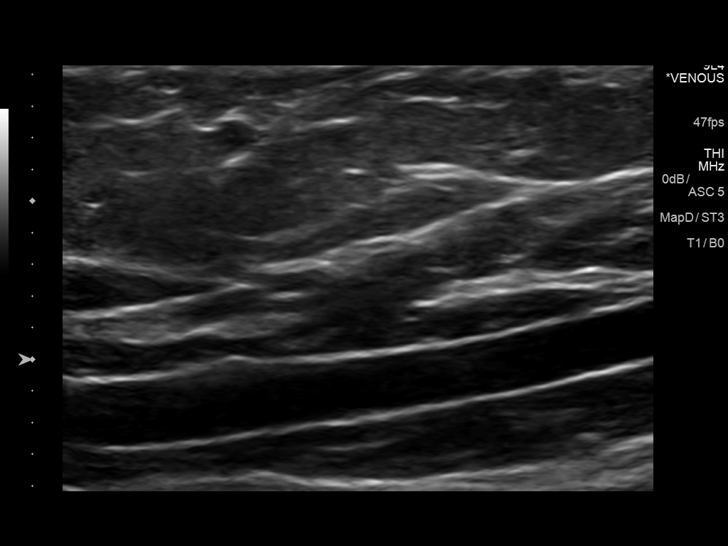
[im 16/31]
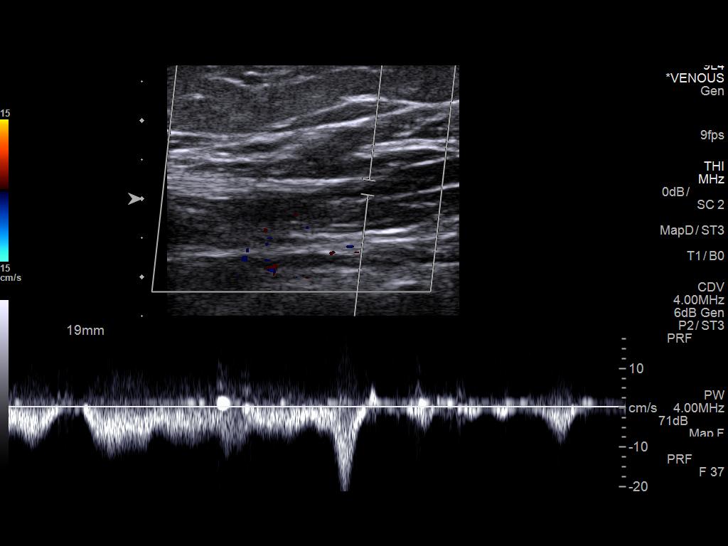
[im 17/31]
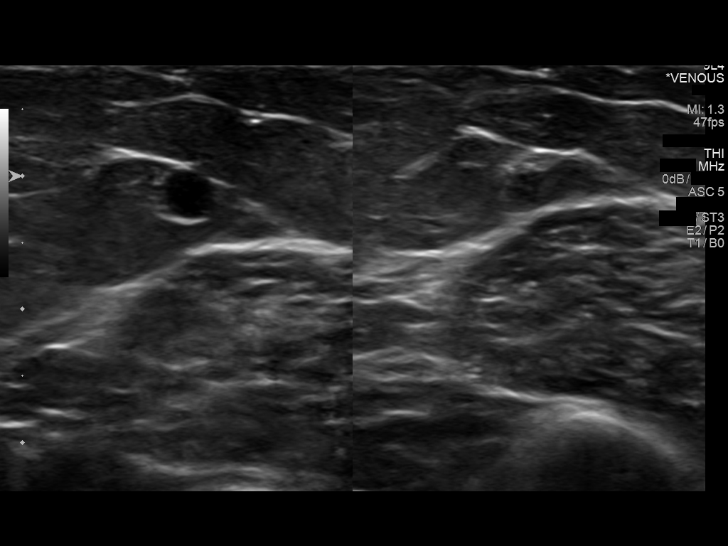
[im 20/31]
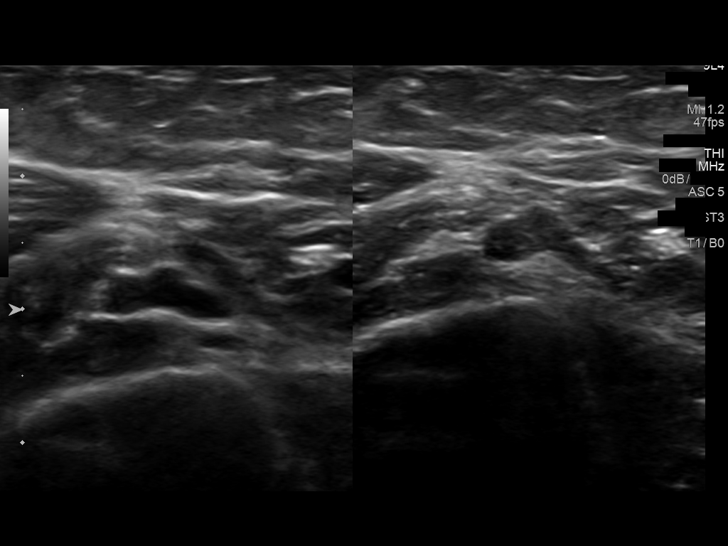
[im 23/31]
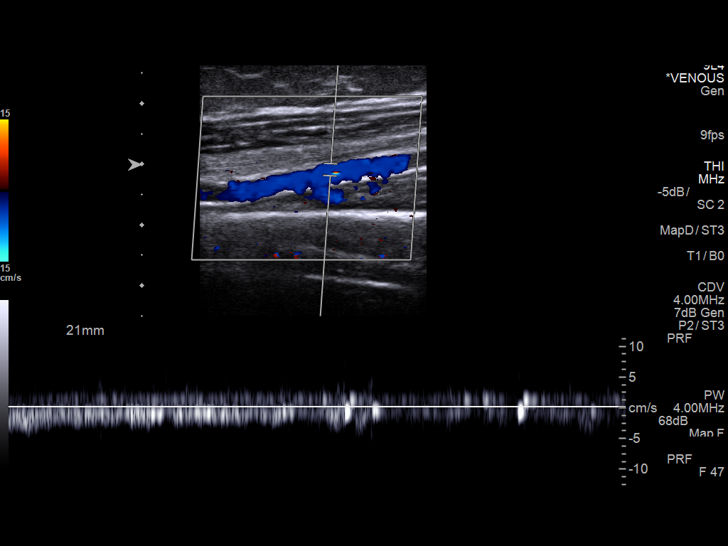
[im 25/31]
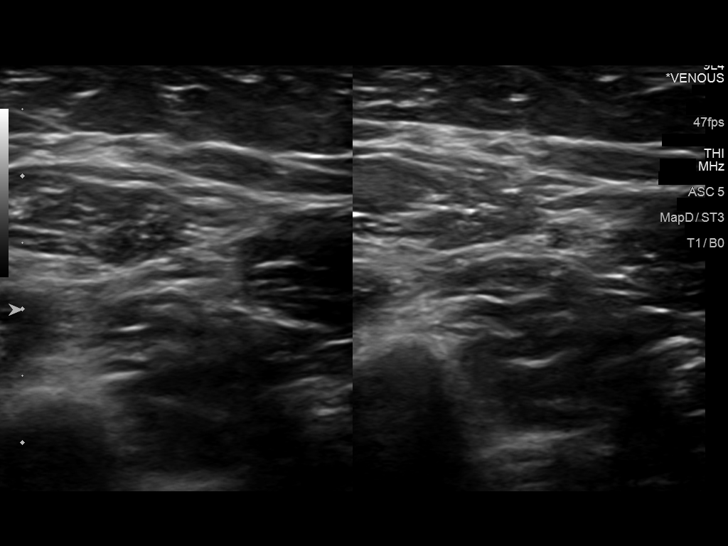
[im 28/31]
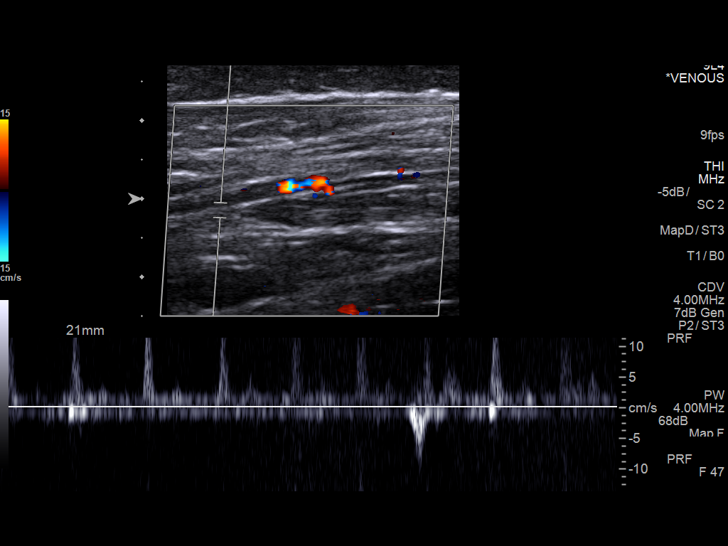
[im 31/31]
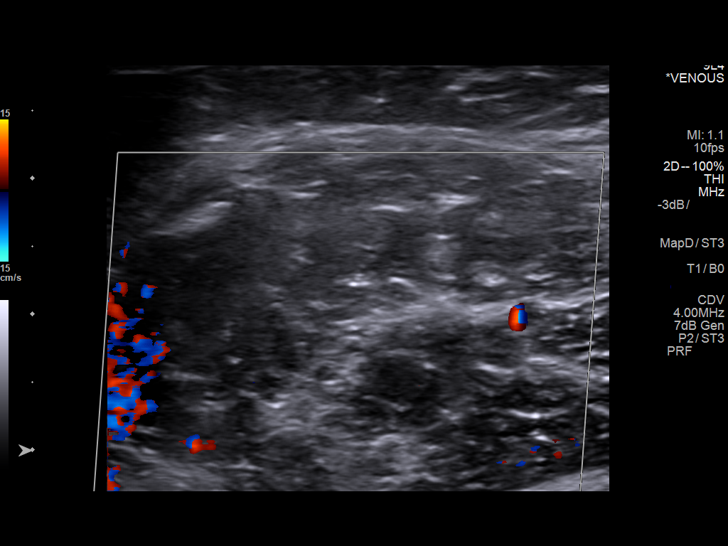

[13 of 24 positions shown; findings below may reference images not displayed]

FINDINGS: Contralateral Subclavian Vein: Respiratory phasicity is normal and
symmetric with the symptomatic side. No evidence of thrombus. Normal
compressibility.

Internal Jugular Vein: No evidence of thrombus. Normal
compressibility, respiratory phasicity and response to augmentation.

Subclavian Vein: No evidence of thrombus. Normal compressibility,
respiratory phasicity and response to augmentation.

Axillary Vein: No evidence of thrombus. Normal compressibility,
respiratory phasicity and response to augmentation.

Cephalic Vein: No evidence of thrombus. Normal compressibility,
respiratory phasicity and response to augmentation.

Basilic Vein: No evidence of thrombus. Normal compressibility,
respiratory phasicity and response to augmentation.

Brachial Veins: No evidence of thrombus. Normal compressibility,
respiratory phasicity and response to augmentation.

Radial Veins: No evidence of thrombus. Normal compressibility,
respiratory phasicity and response to augmentation.

Ulnar Veins: Unable to visualize.

Venous Reflux:  None visualized.

Other Findings:  None visualized.
IMPRESSION: No evidence of right upper extremity deep venous thrombosis. Note
that the ulnar vein is not visualized and potentially may be
hypoplastic or aplastic. There is partial duplication of the
brachial and radial veins, an anatomic variant. The left subclavian
vein is also patent.

## 2017-05-07 ENCOUNTER — Encounter: Payer: Self-pay | Admitting: Emergency Medicine

## 2017-05-07 ENCOUNTER — Other Ambulatory Visit: Payer: Self-pay | Admitting: Physician Assistant

## 2017-05-07 NOTE — Telephone Encounter (Signed)
My chart message sent advising patient is due for an appointment for more refills. Last visit was Dec.

## 2017-05-09 ENCOUNTER — Telehealth: Payer: Self-pay | Admitting: Physician Assistant

## 2017-05-09 NOTE — Telephone Encounter (Signed)
Pt mother called asking why Rx was not being filled and checking status on this, when I advise her that a MyChart message was sent to her, mom said she does not check those. I advise mother that an appt was needed, mom states that pt is going out of town and schedule an appt for next Friday. Mom is asking if refill could be called in, due to pt being out of meds.

## 2017-05-10 NOTE — Telephone Encounter (Signed)
Rx sent to the preferred patient pharmacy  

## 2017-05-17 NOTE — Progress Notes (Deleted)
    Patient presents to clinic today for follow-up of depression. Is currently on a regimen of Lexapro 10 mg daily. *** taking as directed. *** side effect. Symptoms ***. *** SI/HI.   Past Medical History:  Diagnosis Date  . ADHD (attention deficit hyperactivity disorder)   . Depression   . Precocious puberty    Was followed by endocrinology.    Current Outpatient Prescriptions on File Prior to Visit  Medication Sig Dispense Refill  . escitalopram (LEXAPRO) 10 MG tablet TAKE ONE TABLET BY MOUTH ONCE DAILY 30 tablet 0   No current facility-administered medications on file prior to visit.     Allergies  Allergen Reactions  . Lupron [Leuprolide Acetate]     Was allergic to a preservative in the Lupron.    Family History  Problem Relation Age of Onset  . Hypertension Mother   . Diabetes Father   . Heart disease Father   . Cancer Neg Hx     Social History   Social History  . Marital status: Single    Spouse name: N/A  . Number of children: 0  . Years of education: N/A   Occupational History  . 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 Smoking status: Never Smoker  . Smokeless tobacco: Never Used  . Alcohol use No  . Drug use: No  . Sexual activity: Not on file   Other Topics Concern  . Not on file   Social History Narrative   Caffeine Use:  3 daily   Regular Exercise:  2-3 x weekly   Lives with Mom step dad and brother   Attends [employer].   Patient is the granddaughter of [name]             Review of Systems - See HPI.  All other ROS are negative.  There were no vitals taken for this visit.  Physical Exam  No results found for this or any previous visit (from the past 2160 hour(s)).  Assessment/Plan: No problem-specific Assessment & Plan notes found for this encounter.    [name], Chany Woolworth Cody, PA-C

## 2017-05-18 ENCOUNTER — Ambulatory Visit: Payer: BLUE CROSS/BLUE SHIELD | Admitting: Physician Assistant

## 2017-06-08 ENCOUNTER — Telehealth: Payer: Self-pay | Admitting: Physician Assistant

## 2017-06-08 DIAGNOSIS — R0981 Nasal congestion: Secondary | ICD-10-CM

## 2017-06-08 NOTE — Telephone Encounter (Signed)
LMOVM advising if she would like an appt for evaluation of sinuses or want referral to ENT. She is also due for Depression follow up. We can do at the same time.

## 2017-06-08 NOTE — Telephone Encounter (Signed)
Can try to add on an OTC Flonase or nasonex. Otherwise would recommend ENT assessment.

## 2017-06-08 NOTE — Telephone Encounter (Signed)
Pt's mom called in. She said that PCP referred pt to a ENT once before for her sinuses. She said that pt is suffering from a stuffy nose.    Reviewed chart, not showing a ENT referral. She would like to discuss further With PCP.    Phone :  [phone] Minta Balsam- [place]

## 2017-06-08 NOTE — Telephone Encounter (Signed)
Spoke with patient Mother and she states patient is persistently having stuffy nose and having to snort to breathe. She has tried Claritin, Saline nasal rinse with no improvements.  Last time she was given an abx and the nose improved but not resolved.  Is there any other recommendations or referral to ENT for further evaluation.

## 2017-06-08 NOTE — Telephone Encounter (Signed)
Patient's mom returning call to Annette Molina.  Requesting to speak with Annette Molina regarding previous message she left.  Please call her back at [phone].

## 2017-06-08 NOTE — Telephone Encounter (Signed)
Advised a trial of OTC Flonase or nasonex. She states she has tried and only helped a little. Enough to blow her nose and nothing else. Referral placed for ENT for further evaluation. She is agreeable.

## 2017-06-08 NOTE — Addendum Note (Signed)
Addended by: Con MemosMOORE, Jeancarlos Marchena S on: 06/08/2017 03:41 PM   Modules accepted: Orders

## 2017-06-21 NOTE — Progress Notes (Deleted)
    Patient presents to clinic today for follow-up of depressed mood. Patient has been previously well-controlled on Lexapro 10 mg daily. *** taking medication as directed. Endorses ***.  Denies ***.  Patient also requesting TB test. ***.   Past Medical History:  Diagnosis Date  . ADHD (attention deficit hyperactivity disorder)   . Depression   . Precocious puberty    Was followed by endocrinology.    Current Outpatient Prescriptions on File Prior to Visit  Medication Sig Dispense Refill  . escitalopram (LEXAPRO) 10 MG tablet TAKE ONE TABLET BY MOUTH ONCE DAILY 30 tablet 0   No current facility-administered medications on file prior to visit.     Allergies  Allergen Reactions  . Lupron [Leuprolide Acetate]     Was allergic to a preservative in the Lupron.    Family History  Problem Relation Age of Onset  . Hypertension Mother   . Diabetes Father   . Heart disease Father   . Cancer Neg Hx     Social History   Social History  . Marital status: Single    Spouse name: N/A  . Number of children: 0  . Years of education: N/A   Occupational History  . 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 Smoking status: Never Smoker  . Smokeless tobacco: Never Used  . Alcohol use No  . Drug use: No  . Sexual activity: Not on file   Other Topics Concern  . Not on file   Social History Narrative   Caffeine Use:  3 daily   Regular Exercise:  2-3 x weekly   Lives with Mom step dad and brother   Attends [employer].   Patient is the granddaughter of [name]             Review of Systems - See HPI.  All other ROS are negative.  There were no vitals taken for this visit.  Physical Exam  No results found for this or any previous visit (from the past 2160 hour(s)).  Assessment/Plan: No problem-specific Assessment & Plan notes found for this encounter.    [name], Nanci Lakatos Cody, PA-C

## 2017-06-22 ENCOUNTER — Ambulatory Visit: Payer: Self-pay | Admitting: Physician Assistant

## 2017-06-22 ENCOUNTER — Telehealth: Payer: Self-pay | Admitting: Physician Assistant

## 2017-06-22 DIAGNOSIS — Z0289 Encounter for other administrative examinations: Secondary | ICD-10-CM

## 2017-06-22 NOTE — Telephone Encounter (Signed)
She will have to have at [place] with our nursing staff as I do not have these scheduled at other offices. We are happy to accommodate this at patient's earliest convenience.

## 2017-06-22 NOTE — Telephone Encounter (Signed)
Pt called requesting tb test with nurse stating already approved by Malva Coganody Martin, PA. I was trying to schedule and call was disconnected. Is this ok to schedule?

## 2017-06-25 NOTE — Telephone Encounter (Signed)
Pt is schedule for this Friday. 

## 2017-06-25 NOTE — Telephone Encounter (Signed)
Are you able to help with scheduling this?

## 2017-06-26 ENCOUNTER — Ambulatory Visit: Payer: Self-pay | Admitting: Psychology

## 2017-06-29 ENCOUNTER — Ambulatory Visit: Payer: Self-pay | Admitting: Physician Assistant

## 2017-06-29 ENCOUNTER — Telehealth: Payer: Self-pay | Admitting: Physician Assistant

## 2017-06-29 NOTE — Telephone Encounter (Signed)
Ok to cancel giving need to go to the ER. This certainly takes precedent. I will have to keep an eye on number of last minute cancellations because this is a common occurrence.

## 2017-06-29 NOTE — Telephone Encounter (Signed)
When pt mom was leaving from her appt today, she asked to cancel pt appt for this afternoon at 4:30. She states that daughter has had an reaction to some medication and needs to go ER. Please advise ok to cancel with not a 24 hrs notice.

## 2017-08-16 NOTE — Progress Notes (Deleted)
Annette Molina was seen today in neurologic consultation at the request of Noel Journey.  The patient is seen today in neurologic consultation.  She is currently by her mother who helps to supplement the history.  I have reviewed prior records made available to me.  The patient reports that she is been having headaches since December (patient states December but mom states perhaps over the last few months).   She reports that headaches are unusual for her.   Headaches are located in the frontal region.  Headaches are described as throbbing and an annoying ache.  She has had vision changes with the headaches, described as blurry.  She has had no phonophobia but has had photophobia.  She did see the eye doctor this past Thursday for a routine Thursday for a routine visit for contacts and was told that she had swelling bilaterally.  She was told that she couldn't wear contacts right now.  She ended up going to the emergency room for dizziness on 12/11/2015 and mentioned this issue and subsequently had a CT of the brain.  It was reported to show a mildly prominent empty sella but it was otherwise unremarkable.  She was started on 25 mg of Topamax daily.  She was told to schedule outpatient follow-up.  She takes Lexapro and is supposed to be on methylphenidate patch but isn't taking that.  She occasionally takes excedrin but no other supplements.  She is not on birth control.  She has no IUD's.    12/24/15 update:  The patient follows up today, accompanied by her mother who supplements the history.  She underwent a lumbar puncture on 12/15/2015.  Opening pressure was reported to be 300 mm of water (but it was measured in the left lateral decubitus position and it does not appear that legs were extended).  She had an MRI of the brain and MRV of the brain since our last visit.  There is evidence of a partially empty sella with narrowing of the distal aspects of the transverse sinus, but no evidence  of dural venous sinus thrombosis.  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  I did send her for visual field testing and there was evidence of superior quadrant defect on the right.  Mild scattered defects on the left.  She hasn't had headache since the LP  03/23/16 update:  The patient follows up today, accompanied by her mother who supplements the history.  Patient has a history of pseudotumor cerebri.  She started Diamox and is on 500 mg twice per day.  She is faithful with it.  Last visit, I stressed the importance of diet and exercise, with the goal of weight loss.  Unfortunately, she really has not lost a significant amount of weight but she has lost 9 lbs since Jan.  She went to her primary care provider at the end of March and talked to him about weight loss and he talked to her about proper diet as well.  His notes did mention that he did not want to put her on any medication for at least a month.  She was supposed to have lab works for me and called me on March 20 with a headache and I asked her about the lab work.  Her mother stated that she did not know she was supposed to have lab work, so  I reminded her.  She went to the lab but they didn't draw that lab.  She does state that the headaches have come back in the L more than R temporal region.  She has dizziness with it.  She states it is a "pushing pain."  She is getting stuffed up with it.    04/21/16 update:  The patient follows up today, accompanied by her mother who supplements the history.  Patient has a history of pseudotumor cerebri.  She is on Diamox and is on 500 mg twice per day. She saw her eye doctor at [facility] on 02/29/2016.  There was resolved papilledema bilaterally with distinct disc margins.  It stated that visual fields were not particularly improved, but low reliability due to fixation  losses.  When asked about headache, she states that she thinks that it is from not wearing her glasses during the day.  If she wears her glasses, there is no headache.  States that never got call with nutritionist.    08/17/16 update:  The patient follows up today, accompanied by her mother who supplements the history.  She was taken off of Diamox last visit, as papilledema had resolved.  She reports "everything is good."   Then her mom told her that "you need to tell the truth."  Then patient states that she has headaches.  Headaches are over the L temple and feels that it is "like needles."  Mom says she seems to "space out" every few days.  Headaches are one time a week.  While her mom thought that she was to the eye doctor in June, we called their office and last visit was January.  02/12/17 update:  Patient follows up today, accompanied by her mother who supplements the history.  She had an appointment in October, but did not come to that.  She has no showed several appointments with her primary care physicians as well.  I had made her an appointment with [facility] for visual field testing on 08/26/2016 but she did not go to that. She brings records today and she went to a new eye doctor at eye care center in [place] on 02/08/17.  The left eye was normal.  The right was slightly abnormal (looks like enlarged blind spot) but since new eye doctor could not compare to previous.  Notes indicate that patient stopped half way through the right because her head hurt.  She has a f/u with them in a week.   Multiple phone calls from her parents to the PCP indicate that the patient has seen another neurologist but there are no notes.  Pt states that she hasn't been to another neurologist.  She denies any headaches.  Pts mother reports she has had dizzy spells but patient denies it.  Pt states that it happened one time since our last visit but patients mom indicates initially one time a week and then says a few  times.  It is an off balance but not a spinning sensation.  It happens when she gets up quickly.  She only drinks one bottle of water per day.  She usually drinks juice, tea or coke.    08/17/17 update:  Pt seen in f/u for pseudotumor.  This patient is accompanied in the office by her {companion:[id number]} who supplements the history.  The records that were made available to me were reviewed.  Pt went to [facility] since our last visit for her pseudotumor.   She was  seen by neurology and opthalmology.  Neurology records are limited in care everywhere.  I do see she had a LP performed and OP was 260 mm H2O. She was started on topamax.  Mother called back not long after starting medication to [facility] stating that the medication made her lethargic and caused hives.  Opth VF testing at [facility] appears to have been normal.      ALLERGIES:   Allergies  Allergen Reactions  . Lupron [Leuprolide Acetate]     Was allergic to a preservative in the Lupron.    CURRENT MEDICATIONS:  Outpatient Encounter Prescriptions as of 08/17/2017  Medication Sig  . escitalopram (LEXAPRO) 10 MG tablet TAKE ONE TABLET BY MOUTH ONCE DAILY   No facility-administered encounter medications on file as of 08/17/2017.     PAST MEDICAL HISTORY:   Past Medical History:  Diagnosis Date  . ADHD (attention deficit hyperactivity disorder)   . Depression   . Precocious puberty    Was followed by endocrinology.    PAST SURGICAL HISTORY:   Past Surgical History:  Procedure Laterality Date  . NO PAST SURGERIES      SOCIAL HISTORY:   Social History   Social History  . Marital status: Single    Spouse name: N/A  . Number of children: 0  . Years of education: N/A   Occupational History  . 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 Smoking status: Never Smoker  . Smokeless tobacco: Never Used  . Alcohol use No  . Drug use: No  . Sexual activity: Not on file   Other Topics Concern  . Not on file     Social History Narrative   Caffeine Use:  3 daily   Regular Exercise:  2-3 x weekly   Lives with Mom step dad and brother   Attends [employer].   Patient is the granddaughter of [name]             FAMILY HISTORY:   Family Status  Relation Status  . Mother Alive       HTN  . Father Deceased       DM complications  . Brother Alive       3, healthy  . Sister Alive       2, healthy  . Neg Hx (Not Specified)    ROS:    A complete 10 system review of systems was obtained and was unremarkable apart from what is mentioned above.  PHYSICAL EXAMINATION:    VITALS:   There were no vitals filed for this visit. Wt Readings from Last 3 Encounters:  02/12/17 248 lb (112.5 kg)  12/21/16 260 lb (117.9 kg)  11/10/16 256 lb (116.1 kg) (>99 %, Z= 2.57)*   * Growth percentiles are based on CDC 2-20 Years data.     GEN:  Normal appears female in no acute distress.  Appears stated age. HEENT:  Normocephalic, atraumatic. The mucous membranes are moist. The superficial temporal arteries are without ropiness or tenderness. Cardiovascular: Regular rate and rhythm. Lungs: Clear to auscultation bilaterally. Neck/Heme: There are no carotid bruits noted bilaterally. Skin:  Large scar on forearm (states that glass fell on arm at work and cut it - no stitches required)  NEUROLOGICAL: Orientation:  The patient is alert and oriented x 3.   Cranial nerves: There is good facial symmetry. The pupils are equal round and reactive to light bilaterally. No papilledema noted on my funduscopic  exam today.  Extraocular muscles are intact.  Visual fields were full today.    Speech is fluent and clear. Soft palate rises symmetrically and there is no tongue deviation. Hearing is intact to conversational tone. Tone: Tone is good throughout. Sensation: Sensation is intact to light touch throughout Coordination:  The patient has no difficulty with RAM's or FNF bilaterally. Motor: Strength is 5/5 in the  bilateral upper and lower extremities.  Shoulder shrug is equal and symmetric. There is no pronator drift.  There are no fasciculations noted. Gait and Station: The patient is able to ambulate without difficulty but has valgus deformities bilaterally   IMPRESSION/PLAN  1.  Pseudotumor cerebri (benign intracranial hypertension)  -As above, the patient saw her eye doctor on 02/08/17 but saw a new eye doctor and no comparison could be made.  She quit VF testing on the right so has a repeat exam scheduled in a week.  Testing they brought looked stable and just slightly abnormal on the right and normal on the left.  Asked her to sign release at new eye doctor office so he can compare testing since I cannot release that information as the "middle man."  Mother said that she would.  Pt denies headache or vision issues.  Off of diamox as of May, 2017  -reports no headache  -Talked to the patient again about diet and exercise.  Has lost some weight and I congratulated her  2.  Dizziness  -she really needs to increase water intake.  Talked to her extensively about this.  Told her want her to d/c soda and juice and start 60 oz of water per day.  3.  Follow up in 6 months.  Ask eye doctor to send me his eval next week and f/u sooner if something has chnaged.  Will need to ask eye doctor when she is released for driving (suspect able to drive now).  Much greater than 50% of this visit was spent in counseling and coordinating care.  Total face to face time:  25 min

## 2017-08-17 ENCOUNTER — Ambulatory Visit: Payer: BLUE CROSS/BLUE SHIELD | Admitting: Neurology

## 2017-08-18 ENCOUNTER — Emergency Department (HOSPITAL_BASED_OUTPATIENT_CLINIC_OR_DEPARTMENT_OTHER)
Admission: EM | Admit: 2017-08-18 | Discharge: 2017-08-18 | Disposition: A | Discharge status: Home / Self Care | Payer: Self-pay | Attending: Emergency Medicine | Admitting: Emergency Medicine

## 2017-08-18 ENCOUNTER — Encounter (HOSPITAL_BASED_OUTPATIENT_CLINIC_OR_DEPARTMENT_OTHER): Payer: Self-pay | Admitting: Emergency Medicine

## 2017-08-18 ENCOUNTER — Emergency Department (HOSPITAL_BASED_OUTPATIENT_CLINIC_OR_DEPARTMENT_OTHER): Payer: Self-pay

## 2017-08-18 DIAGNOSIS — M79644 Pain in right finger(s): Secondary | ICD-10-CM | POA: Insufficient documentation

## 2017-08-18 DIAGNOSIS — Z5321 Procedure and treatment not carried out due to patient leaving prior to being seen by health care provider: Secondary | ICD-10-CM | POA: Insufficient documentation

## 2017-08-18 NOTE — ED Notes (Signed)
PT stated she wanted to leave. She did not feel that finger was broken and wants to leave.

## 2017-08-18 NOTE — ED Triage Notes (Signed)
R middle finger pain and inability to bend it x 30 min. Denies injury.

## 2017-08-23 ENCOUNTER — Encounter: Payer: Self-pay | Admitting: Neurology

## 2017-08-29 ENCOUNTER — Encounter: Payer: Self-pay | Admitting: Physician Assistant

## 2017-08-31 ENCOUNTER — Ambulatory Visit: Payer: Self-pay | Admitting: Physician Assistant

## 2017-09-04 ENCOUNTER — Encounter (HOSPITAL_BASED_OUTPATIENT_CLINIC_OR_DEPARTMENT_OTHER): Payer: Self-pay | Admitting: Emergency Medicine

## 2017-09-04 ENCOUNTER — Ambulatory Visit: Payer: Self-pay | Admitting: Family Medicine

## 2017-09-04 ENCOUNTER — Emergency Department (HOSPITAL_BASED_OUTPATIENT_CLINIC_OR_DEPARTMENT_OTHER)
Admission: EM | Admit: 2017-09-04 | Discharge: 2017-09-04 | Disposition: A | Discharge status: Home / Self Care | Payer: Self-pay | Attending: Emergency Medicine | Admitting: Emergency Medicine

## 2017-09-04 DIAGNOSIS — Z79899 Other long term (current) drug therapy: Secondary | ICD-10-CM | POA: Insufficient documentation

## 2017-09-04 DIAGNOSIS — K529 Noninfective gastroenteritis and colitis, unspecified: Secondary | ICD-10-CM | POA: Insufficient documentation

## 2017-09-04 DIAGNOSIS — Z0289 Encounter for other administrative examinations: Secondary | ICD-10-CM

## 2017-09-04 LAB — CBC WITH DIFFERENTIAL/PLATELET
BASOS ABS: 0 10*3/uL (ref 0.0–0.1)
Basophils Relative: 0 %
Eosinophils Absolute: 0.1 10*3/uL (ref 0.0–0.7)
Eosinophils Relative: 1 %
HCT: 36.8 % (ref 36.0–46.0)
Hemoglobin: 12 g/dL (ref 12.0–15.0)
Lymphocytes Relative: 38 %
Lymphs Abs: 2.1 10*3/uL (ref 0.7–4.0)
MCH: 26.1 pg (ref 26.0–34.0)
MCHC: 32.6 g/dL (ref 30.0–36.0)
MCV: 80 fL (ref 78.0–100.0)
MONO ABS: 0.7 10*3/uL (ref 0.1–1.0)
Monocytes Relative: 13 %
Neutro Abs: 2.6 10*3/uL (ref 1.7–7.7)
Neutrophils Relative %: 48 %
Platelets: 324 10*3/uL (ref 150–400)
RBC: 4.6 MIL/uL (ref 3.87–5.11)
RDW: 14.2 % (ref 11.5–15.5)
WBC: 5.5 10*3/uL (ref 4.0–10.5)

## 2017-09-04 LAB — BASIC METABOLIC PANEL
ANION GAP: 7 (ref 5–15)
BUN: 11 mg/dL (ref 6–20)
CALCIUM: 8.8 mg/dL — AB (ref 8.9–10.3)
CO2: 22 mmol/L (ref 22–32)
Chloride: 108 mmol/L (ref 101–111)
Creatinine, Ser: 0.61 mg/dL (ref 0.44–1.00)
GLUCOSE: 113 mg/dL — AB (ref 65–99)
POTASSIUM: 3.7 mmol/L (ref 3.5–5.1)
SODIUM: 137 mmol/L (ref 135–145)

## 2017-09-04 LAB — HCG, SERUM, QUALITATIVE: PREG SERUM: NEGATIVE

## 2017-09-04 MED ORDER — SODIUM CHLORIDE 0.9 % IV SOLN
INTRAVENOUS | Status: DC
Start: 2017-09-04 — End: 2017-09-04

## 2017-09-04 MED ORDER — LOPERAMIDE HCL 2 MG PO TABS: 2.0000 mg | ORAL_TABLET | Freq: Four times a day (QID) | ORAL | 0 refills | Status: DC | PRN

## 2017-09-04 MED ORDER — SODIUM CHLORIDE 0.9 % IV BOLUS (SEPSIS)
1000.0000 mL | Freq: Once | INTRAVENOUS | Status: AC
  Administered 2017-09-04: 1000 mL via INTRAVENOUS

## 2017-09-04 MED ORDER — ONDANSETRON HCL 4 MG/2ML IJ SOLN
4.0000 mg | Freq: Once | INTRAMUSCULAR | Status: AC
  Administered 2017-09-04: 4 mg via INTRAVENOUS
  Filled 2017-09-04: qty 2

## 2017-09-04 MED ORDER — PROMETHAZINE HCL 25 MG PO TABS: 25.0000 mg | ORAL_TABLET | Freq: Four times a day (QID) | ORAL | 1 refills | Status: DC | PRN

## 2017-09-04 NOTE — ED Notes (Signed)
Pt still in restroom and waiting to bring her back to a room.

## 2017-09-04 NOTE — ED Notes (Signed)
Given gingerale for fluid challenge  

## 2017-09-04 NOTE — ED Triage Notes (Signed)
Patient states that she woke up this am and had Nausea. Once she brushed her teeth she vomiting x 1  - patient reports that she has had N/V/D since 730 am

## 2017-09-04 NOTE — ED Provider Notes (Signed)
[facility]   CSN: [id number] Arrival date & time: 09/04/17  1011     History   Chief Complaint Chief Complaint  Patient presents with  . Emesis    HPI Annette Molina is a 21 y.o. female.  Patient with onset of diarrhea yesterday at 6 episodes. Today at 7:30 in the morning started with nausea and vomiting. Had about 6 episodes. No further diarrhea at this time. No significant abdominal pain. No known sick exposures.      Past Medical History:  Diagnosis Date  . ADHD (attention deficit hyperactivity disorder)   . Depression   . Precocious puberty    Was followed by endocrinology.    Patient Active Problem List   Diagnosis Date Noted  . Obesity 02/15/2016  . Pseudotumor cerebri 12/13/2015  . Hyperpigmentation 11/17/2015  . Mood swings 05/27/2015  . Fatigue 10/19/2014  . Dysmenorrhea in the adolescent 07/04/2011  . ADHD (attention deficit hyperactivity disorder) 06/06/2011    Past Surgical History:  Procedure Laterality Date  . NO PAST SURGERIES      OB History    No data available       Home Medications    Prior to Admission medications   Medication Sig Start Date End Date Taking? Authorizing Provider  escitalopram (LEXAPRO) 10 MG tablet TAKE ONE TABLET BY MOUTH ONCE DAILY 05/09/17   Waldon Merl, PA-C  loperamide (IMODIUM A-D) 2 MG tablet Take 1 tablet (2 mg total) by mouth 4 (four) times daily as needed for diarrhea or loose stools. 09/04/17   Vanetta Mulders, MD  promethazine (PHENERGAN) 25 MG tablet Take 1 tablet (25 mg total) by mouth every 6 (six) hours as needed for nausea or vomiting. 09/04/17   Vanetta Mulders, MD  topiramate (TOPAMAX) 25 MG tablet Take 25 mg by mouth 2 (two) times daily.    [provider]    Family History Family History  Problem Relation Age of Onset  . Hypertension Mother   . Diabetes Father   . Heart disease Father   . Cancer Neg Hx     Social History Social History    Substance Use Topics  . Smoking status: Never Smoker  . Smokeless tobacco: Never Used  . Alcohol use No     Allergies   Lupron [leuprolide acetate]   Review of Systems Review of Systems  Constitutional: Negative for fever.  HENT: Negative for congestion.   Eyes: Negative for redness.  Respiratory: Negative for shortness of breath.   Gastrointestinal: Positive for diarrhea, nausea and vomiting. Negative for abdominal pain.  Genitourinary: Negative for dysuria.  Musculoskeletal: Negative for back pain.  Skin: Negative for rash.  Neurological: Negative for dizziness.  Hematological: Does not bruise/bleed easily.  Psychiatric/Behavioral: Negative for confusion.     Physical Exam Updated Vital Signs BP 123/82 (BP Location: Left Arm)   Pulse 77   Temp 98.3 F (36.8 C) (Oral)   Resp 16   Ht 1.676 m ( )   Wt 116.1 kg (256 lb)   LMP 08/18/2017   SpO2 100%   BMI 41.32 kg/m   Physical Exam  Constitutional: She is oriented to person, place, and time. She appears well-developed and well-nourished. No distress.  HENT:  Head: Normocephalic and atraumatic.  Mouth/Throat: Oropharynx is clear and moist.  Eyes: Pupils are equal, round, and reactive to light. Conjunctivae and EOM are normal.  Neck: Normal range of motion. Neck supple.  Cardiovascular: Normal rate, regular rhythm and normal heart  sounds.   Pulmonary/Chest: Effort normal and breath sounds normal.  Abdominal: Soft. Bowel sounds are normal. There is no tenderness.  Musculoskeletal: Normal range of motion. She exhibits no edema.  Neurological: She is alert and oriented to person, place, and time. No cranial nerve deficit or sensory deficit. She exhibits normal muscle tone. Coordination normal.  Skin: Skin is warm.  Nursing note and vitals reviewed.    ED Treatments / Results  Labs (all labs ordered are listed, but only abnormal results are displayed) Labs Reviewed  BASIC METABOLIC PANEL - Abnormal; Notable  for the following:       Result Value   Glucose, Bld 113 (*)    Calcium 8.8 (*)    All other components within normal limits  CBC WITH DIFFERENTIAL/PLATELET  HCG, SERUM, QUALITATIVE    EKG  EKG Interpretation None       Radiology No results found.  Procedures Procedures (including critical care time)  Medications Ordered in ED Medications  0.9 %  sodium chloride infusion (not administered)  sodium chloride 0.9 % bolus 1,000 mL (0 mLs Intravenous Stopped 09/04/17 1124)  ondansetron (ZOFRAN) injection 4 mg (4 mg Intravenous Given 09/04/17 1051)     Initial Impression / Assessment and Plan / ED Course  I have reviewed the triage vital signs and the nursing notes.  Pertinent labs & imaging results that were available during my care of the patient were reviewed by me and considered in my medical decision making (see chart for details).   patient nontoxic no acute distress. Symptoms suggestive of viral gastroenteritis. Labs without significant abnormalities. Pregnancy test negative. Patient treated with 1 L fluid of normal saline patient overall feeling much better. Patient also treated with Zofran. Patient we discharged home with the Phenergan and Imodium. She will return for any new or worse symptoms or if not improving in 24 hours. Work note provided.    Final Clinical Impressions(s) / ED Diagnoses   Final diagnoses:  Gastroenteritis    New Prescriptions New Prescriptions   LOPERAMIDE (IMODIUM A-D) 2 MG TABLET    Take 1 tablet (2 mg total) by mouth 4 (four) times daily as needed for diarrhea or loose stools.   PROMETHAZINE (PHENERGAN) 25 MG TABLET    Take 1 tablet (25 mg total) by mouth every 6 (six) hours as needed for nausea or vomiting.     Vanetta Mulders, MD 09/04/17 1143

## 2017-09-04 NOTE — ED Notes (Signed)
No questions regarding d/c instructions or prescriptions. Signature pad unavailable

## 2017-09-04 NOTE — Discharge Instructions (Signed)
Take Phenergan as needed for nausea and vomiting. Take Imodium as needed for diarrhea. Work note provided. Would expect improvement over the next 24 hours. Return for any new or worse symptoms.

## 2017-10-04 ENCOUNTER — Other Ambulatory Visit: Payer: Self-pay | Admitting: Physician Assistant

## 2017-10-08 ENCOUNTER — Ambulatory Visit (INDEPENDENT_AMBULATORY_CARE_PROVIDER_SITE_OTHER): Payer: BLUE CROSS/BLUE SHIELD | Admitting: Physician Assistant

## 2017-10-08 ENCOUNTER — Encounter: Payer: Self-pay | Admitting: Physician Assistant

## 2017-10-08 ENCOUNTER — Other Ambulatory Visit: Payer: Self-pay

## 2017-10-08 DIAGNOSIS — F3130 Bipolar disorder, current episode depressed, mild or moderate severity, unspecified: Secondary | ICD-10-CM

## 2017-10-08 DIAGNOSIS — F319 Bipolar disorder, unspecified: Secondary | ICD-10-CM

## 2017-10-08 MED ORDER — TOPIRAMATE 25 MG PO TABS: 25.0000 mg | ORAL_TABLET | Freq: Two times a day (BID) | ORAL | 3 refills | Status: DC

## 2017-10-08 MED ORDER — ESCITALOPRAM OXALATE 10 MG PO TABS: 10.0000 mg | ORAL_TABLET | Freq: Every day | ORAL | 5 refills | Status: DC

## 2017-10-08 MED ORDER — QUETIAPINE FUMARATE 50 MG PO TABS: 50.0000 mg | ORAL_TABLET | Freq: Every day | ORAL | 0 refills | Status: DC

## 2017-10-08 NOTE — Patient Instructions (Signed)
Please continue chronic medications as directed. Start the low-dose of Seroquel nightly to see if this helps with fluctuating mood.  Follow-up in 3-4 weeks for reassessment.

## 2017-10-08 NOTE — Assessment & Plan Note (Signed)
Will add on Seroquel 50 mg nightly in addition to the Lexapro. Close follow-up scheduled.

## 2017-10-08 NOTE — Progress Notes (Signed)
Patient presents to clinic today for follow-up of depression. Patient is currently on a regimen of Lexapro 10 mg daily. Has noted improvement in symptoms on this regimen for some time. Notes improvement in depressed mood but is still having issue with labile mood/irritability. Denies SI/HI. States she was previously on combination of the Lexapro and low-dose Seroquel with better relief of symptoms. Would like to restart.   Past Medical History:  Diagnosis Date  . ADHD (attention deficit hyperactivity disorder)   . Depression   . Precocious puberty    Was followed by endocrinology.    No current outpatient medications on file prior to visit.   No current facility-administered medications on file prior to visit.     Allergies  Allergen Reactions  . Lupron [Leuprolide Acetate]     Was allergic to a preservative in the Lupron.    Family History  Problem Relation Age of Onset  . Hypertension Mother   . Diabetes Father   . Heart disease Father   . Cancer Neg Hx     Social History   Socioeconomic History  . Marital status: Single    Spouse name: None  . Number of children: 0  . Years of education: None  . Highest education level: None  Social Needs  . Financial resource strain: None  . Food insecurity - worry: None  . Food insecurity - inability: None  . Transportation needs - medical: None  . Transportation needs - non-medical: None  Occupational History  . 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 Smoking status: Never Smoker  . Smokeless tobacco: Never Used  Substance and Sexual Activity  . Alcohol use: No    Alcohol/week: 0.0 oz  . Drug use: No  . Sexual activity: None  Other Topics Concern  . None  Social History Narrative   Caffeine Use:  3 daily   Regular Exercise:  2-3 x weekly   Lives with Mom step dad and brother   Attends [employer].   Patient is the granddaughter of [place] [employer] - See HPI.  All other  ROS are negative.  BP 110/80   Pulse 71   Temp 97.6 F (36.4 C) (Oral)   Resp 14   Ht '5\' 6"'  (1.676 m)   Wt 272 lb (123.4 kg)   SpO2 99%   BMI 43.90 kg/m   Physical Exam  Constitutional: She is oriented to person, place, and time and well-developed, well-nourished, and in no distress.  HENT:  Head: Normocephalic and atraumatic.  Eyes: Conjunctivae are normal.  Neck: Neck supple.  Cardiovascular: Normal rate, regular rhythm, normal heart sounds and intact distal pulses.  Pulmonary/Chest: Effort normal and breath sounds normal. No respiratory distress. She has no wheezes. She has no rales. She exhibits no tenderness.  Neurological: She is alert and oriented to person, place, and time. No cranial nerve deficit.  Skin: Skin is warm and dry. No rash noted.  Psychiatric: Affect normal.  Vitals reviewed.   Recent Results (from the past 2160 hour(s))  CBC with Differential/Platelet     Status: None   Collection Time: 09/04/17 10:40 AM  Result Value Ref Range   WBC 5.5 4.0 - 10.5 K/uL   RBC 4.60 3.87 - 5.11 MIL/uL   Hemoglobin 12.0 12.0 - 15.0 g/dL   HCT 36.8 36.0 - 46.0 %   MCV 80.0 78.0 - 100.0 fL  MCH 26.1 26.0 - 34.0 pg   MCHC 32.6 30.0 - 36.0 g/dL   RDW 14.2 11.5 - 15.5 %   Platelets 324 150 - 400 K/uL   Neutrophils Relative % 48 %   Neutro Abs 2.6 1.7 - 7.7 K/uL   Lymphocytes Relative 38 %   Lymphs Abs 2.1 0.7 - 4.0 K/uL   Monocytes Relative 13 %   Monocytes Absolute 0.7 0.1 - 1.0 K/uL   Eosinophils Relative 1 %   Eosinophils Absolute 0.1 0.0 - 0.7 K/uL   Basophils Relative 0 %   Basophils Absolute 0.0 0.0 - 0.1 K/uL  Basic metabolic panel     Status: Abnormal   Collection Time: 09/04/17 10:40 AM  Result Value Ref Range   Sodium 137 135 - 145 mmol/L   Potassium 3.7 3.5 - 5.1 mmol/L   Chloride 108 101 - 111 mmol/L   CO2 22 22 - 32 mmol/L   Glucose, Bld 113 (H) 65 - 99 mg/dL   BUN 11 6 - 20 mg/dL   Creatinine, Ser 0.61 0.44 - 1.00 mg/dL   Calcium 8.8 (L) 8.9 -  10.3 mg/dL   GFR calc non Af Amer >60 >60 mL/min   GFR calc Af Amer >60 >60 mL/min    Comment: (NOTE) The eGFR has been calculated using the CKD EPI equation. This calculation has not been validated in all clinical situations. eGFR's persistently <60 mL/min signify possible Chronic Kidney Disease.    Anion gap 7 5 - 15  hCG, serum, qualitative     Status: None   Collection Time: 09/04/17 10:40 AM  Result Value Ref Range   Preg, Serum NEGATIVE NEGATIVE    Assessment/Plan: Bipolar depression ([place]) Will add on Seroquel 50 mg nightly in addition to the Lexapro. Close follow-up scheduled.     Leeanne Rio, PA-C

## 2017-10-08 NOTE — Progress Notes (Signed)
Pre visit review using our clinic review tool, if applicable. No additional management support is needed unless otherwise documented below in the visit note. 

## 2017-11-06 ENCOUNTER — Ambulatory Visit: Payer: Self-pay | Admitting: Physician Assistant

## 2017-11-08 ENCOUNTER — Ambulatory Visit: Payer: Self-pay | Admitting: Physician Assistant

## 2017-11-08 ENCOUNTER — Encounter: Payer: Self-pay | Admitting: Physician Assistant

## 2017-11-13 ENCOUNTER — Other Ambulatory Visit: Payer: Self-pay | Admitting: Physician Assistant

## 2017-11-29 ENCOUNTER — Encounter: Payer: Self-pay | Admitting: Physician Assistant

## 2017-12-03 ENCOUNTER — Ambulatory Visit (INDEPENDENT_AMBULATORY_CARE_PROVIDER_SITE_OTHER): Payer: BLUE CROSS/BLUE SHIELD | Admitting: Physician Assistant

## 2017-12-03 ENCOUNTER — Encounter: Payer: Self-pay | Admitting: Physician Assistant

## 2017-12-03 VITALS — BP 108/80 | HR 69 | Temp 97.7°F | Resp 14 | Ht 66.0 in | Wt 272.0 lb

## 2017-12-03 DIAGNOSIS — F3130 Bipolar disorder, current episode depressed, mild or moderate severity, unspecified: Secondary | ICD-10-CM | POA: Diagnosis not present

## 2017-12-03 DIAGNOSIS — J309 Allergic rhinitis, unspecified: Secondary | ICD-10-CM | POA: Insufficient documentation

## 2017-12-03 DIAGNOSIS — J302 Other seasonal allergic rhinitis: Secondary | ICD-10-CM | POA: Diagnosis not present

## 2017-12-03 DIAGNOSIS — F319 Bipolar disorder, unspecified: Secondary | ICD-10-CM

## 2017-12-03 MED ORDER — FLUOXETINE HCL 20 MG PO TABS: 20.0000 mg | ORAL_TABLET | Freq: Every day | ORAL | 3 refills | Status: DC

## 2017-12-03 NOTE — Patient Instructions (Signed)
Please reduce Seroquel to 1/2 tablet nightly x 3 nights before stopping completely.  Stop the Lexapro and take the Fluoxetine daily as directed instead.  Follow-up in 4-6 weeks for reassessment and further adjustments.  Stay hydrated and get plenty of rest. Start saline nasal rinses and once daily OTC Flonase. Also get OTc Xyzal and take once daily. Delsym will help with cough. Follow-up if not resolving.

## 2017-12-03 NOTE — Assessment & Plan Note (Signed)
Stop Seroquel with wuick wean -- decreasing to 25 mg nightly x 3 nights before stopping. Stop Lexapro and Start Fluoxetine 20 mg daily. Follow-up with me in 4 weeks for reassessment.

## 2017-12-03 NOTE — Progress Notes (Signed)
Patient presents to clinic today for follow-up of depression. At last visit Seroquel 50 mg nightly was added to regimen of Lexapro 10 mg daily due to fluctuating moods. Patient has taken as directed and notes improvement in mood but notes significant hypersomnolence with Seroquel. Denies SI/HI. Would like to discuss other options.   Also notes 1 week of dry cough with runny nose, itchy throat and watery eyes. Has history of allergies but is not taking anything for this presently. Denies fever, chills, sinus pain, ear pain, tooth pain or chest congestion..  Past Medical History:  Diagnosis Date  . ADHD (attention deficit hyperactivity disorder)   . Depression   . Precocious puberty    Was followed by endocrinology.    Current Outpatient Medications on File Prior to Visit  Medication Sig Dispense Refill  . topiramate (TOPAMAX) 25 MG tablet Take 1 tablet (25 mg total) 2 (two) times daily by mouth. 60 tablet 3   No current facility-administered medications on file prior to visit.     Allergies  Allergen Reactions  . Lupron [Leuprolide Acetate]     Was allergic to a preservative in the Lupron.    Family History  Problem Relation Age of Onset  . Hypertension Mother   . Diabetes Father   . Heart disease Father   . Cancer Neg Hx     Social History   Socioeconomic History  . Marital status: Single    Spouse name: None  . Number of children: 0  . Years of education: None  . Highest education level: None  Social Needs  . Financial resource strain: None  . Food insecurity - worry: None  . Food insecurity - inability: None  . Transportation needs - medical: None  . Transportation needs - non-medical: None  Occupational History  . 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 Smoking status: Never Smoker  . Smokeless tobacco: Never Used  Substance and Sexual Activity  . Alcohol use: No    Alcohol/week: 0.0 oz  . Drug use: No  . Sexual activity: None    Other Topics Concern  . None  Social History Narrative   Caffeine Use:  3 daily   Regular Exercise:  2-3 x weekly   Lives with Mom step dad and brother   Attends [employer].   Patient is the granddaughter of [place] [employer] - See HPI.  All other ROS are negative.  BP 108/80   Pulse 69   Temp 97.7 F (36.5 C) (Oral)   Resp 14   Ht _0  (1.676 m)   Wt 272 lb (123.4 kg)   SpO2 98%   BMI 43.90 kg/m   Physical Exam  Constitutional: She is oriented to person, place, and time and well-developed, well-nourished, and in no distress.  HENT:  Head: Normocephalic and atraumatic.  Right Ear: Tympanic membrane normal.  Left Ear: Tympanic membrane normal.  Nose: Mucosal edema and rhinorrhea present. Right sinus exhibits no maxillary sinus tenderness and no frontal sinus tenderness. Left sinus exhibits no maxillary sinus tenderness and no frontal sinus tenderness.  Mouth/Throat: Uvula is midline, oropharynx is clear and moist and mucous membranes are normal.  Eyes: Conjunctivae are normal.  Neck: Neck supple.  Cardiovascular: Normal rate, regular rhythm, normal heart sounds and intact distal pulses.  Pulmonary/Chest: Effort normal and breath sounds normal. No respiratory distress. She has no wheezes. She has no  rales. She exhibits no tenderness.  Lymphadenopathy:    She has no cervical adenopathy.  Neurological: She is alert and oriented to person, place, and time.  Skin: Skin is warm and dry. No rash noted.  Psychiatric: Affect normal.  Vitals reviewed.   Recent Results (from the past 2160 hour(s))  CBC with Differential/Platelet     Status: None   Collection Time: 09/04/17 10:40 AM  Result Value Ref Range   WBC 5.5 4.0 - 10.5 K/uL   RBC 4.60 3.87 - 5.11 MIL/uL   Hemoglobin 12.0 12.0 - 15.0 g/dL   HCT 36.8 36.0 - 46.0 %   MCV 80.0 78.0 - 100.0 fL   MCH 26.1 26.0 - 34.0 pg   MCHC 32.6 30.0 - 36.0 g/dL   RDW 14.2 11.5 - 15.5 %   Platelets 324 150 - 400 K/uL    Neutrophils Relative % 48 %   Neutro Abs 2.6 1.7 - 7.7 K/uL   Lymphocytes Relative 38 %   Lymphs Abs 2.1 0.7 - 4.0 K/uL   Monocytes Relative 13 %   Monocytes Absolute 0.7 0.1 - 1.0 K/uL   Eosinophils Relative 1 %   Eosinophils Absolute 0.1 0.0 - 0.7 K/uL   Basophils Relative 0 %   Basophils Absolute 0.0 0.0 - 0.1 K/uL  Basic metabolic panel     Status: Abnormal   Collection Time: 09/04/17 10:40 AM  Result Value Ref Range   Sodium 137 135 - 145 mmol/L   Potassium 3.7 3.5 - 5.1 mmol/L   Chloride 108 101 - 111 mmol/L   CO2 22 22 - 32 mmol/L   Glucose, Bld 113 (H) 65 - 99 mg/dL   BUN 11 6 - 20 mg/dL   Creatinine, Ser 0.61 0.44 - 1.00 mg/dL   Calcium 8.8 (L) 8.9 - 10.3 mg/dL   GFR calc non Af Amer >60 >60 mL/min   GFR calc Af Amer >60 >60 mL/min    Comment: (NOTE) The eGFR has been calculated using the CKD EPI equation. This calculation has not been validated in all clinical situations. eGFR's persistently <60 mL/min signify possible Chronic Kidney Disease.    Anion gap 7 5 - 15  hCG, serum, qualitative     Status: None   Collection Time: 09/04/17 10:40 AM  Result Value Ref Range   Preg, Serum NEGATIVE NEGATIVE    Assessment/Plan: Allergic rhinitis Start daily OTC Xyzal. Start saline rinse and Flonase. Delsym for cough. Follow-up if not improving.   Bipolar depression ([place]) Stop Seroquel with wuick wean -- decreasing to 25 mg nightly x 3 nights before stopping. Stop Lexapro and Start Fluoxetine 20 mg daily. Follow-up with me in 4 weeks for reassessment.     Leeanne Rio, PA-C

## 2017-12-03 NOTE — Assessment & Plan Note (Signed)
Start daily OTC Xyzal. Start saline rinse and Flonase. Delsym for cough. Follow-up if not improving.

## 2017-12-14 ENCOUNTER — Ambulatory Visit: Payer: Self-pay | Admitting: Physician Assistant

## 2017-12-17 ENCOUNTER — Ambulatory Visit: Payer: Self-pay | Admitting: Physician Assistant

## 2017-12-17 DIAGNOSIS — Z0289 Encounter for other administrative examinations: Secondary | ICD-10-CM

## 2017-12-21 ENCOUNTER — Encounter: Payer: Self-pay | Admitting: Family Medicine

## 2017-12-27 ENCOUNTER — Ambulatory Visit (INDEPENDENT_AMBULATORY_CARE_PROVIDER_SITE_OTHER): Payer: BLUE CROSS/BLUE SHIELD | Admitting: Medical

## 2017-12-27 ENCOUNTER — Encounter: Payer: Self-pay | Admitting: Medical

## 2017-12-27 VITALS — BP 132/76 | HR 64 | Temp 98.1°F | Resp 16 | Ht 66.0 in | Wt 273.0 lb

## 2017-12-27 DIAGNOSIS — L918 Other hypertrophic disorders of the skin: Secondary | ICD-10-CM | POA: Diagnosis not present

## 2017-12-27 MED ORDER — AZELASTINE HCL 0.1 % NA SOLN
2.0000 | Freq: Two times a day (BID) | NASAL | 2 refills | Status: DC
Start: 2017-12-27 — End: 2018-02-27

## 2017-12-27 MED ORDER — BENZONATATE 100 MG PO CAPS: 100.0000 mg | ORAL_CAPSULE | Freq: Three times a day (TID) | ORAL | 0 refills | Status: DC | PRN

## 2017-12-27 MED ORDER — FEXOFENADINE HCL 180 MG PO TABS: 180.0000 mg | ORAL_TABLET | Freq: Every day | ORAL | 2 refills | Status: DC

## 2017-12-27 MED ORDER — FLUTICASONE PROPIONATE 50 MCG/ACT NA SUSP: 2.0000 | Freq: Every day | NASAL | 1 refills | Status: DC

## 2017-12-27 NOTE — Patient Instructions (Signed)
You do appear to have allergic rhinitis.  Cough appears his to be associated with postnasal drainage and not infection.  I do think you would benefit from Allegra(change up from your previous Xyzal which was not working.)  [employer]erson Electricestart your Flonase.  If after 4 days you do not see much improvement with Flonase then add Astelin.  You did mention it time symptoms worsen with rapid weather changes.  You might have a component of vasomotor rhinitis.  Also for your intermittent cough making benzonatate available.  If by Monday you have worse symptoms such as sinus pressure, productive cough or chest congestion please let me know and in that event would write antibiotic.  Currently no bacterial infection present.  You do have 2 moderately large skin tags on your face.  Based on the location I think it would be better to have these removed by a dermatologist.  I am concerned that you might have moderate scarring.  I will place that referral and if no one calls you within a week then would recommend that you call and ask for an update from [place].  Follow-up in 7-10 days or as needed.

## 2017-12-27 NOTE — Progress Notes (Signed)
Subjective:    Patient ID: Annette Molina, female    DOB: 12/03/1995, 22 y.o.   MRN: [id number]  HPI  Pt has cough for 3 weeks. Her nose has been running a lot. Pt has clear mucus. Pt get strong impulse to cough at times. Pt states 3 weeks ago given xyzal but did not help. Pt used to use nasal sprays in the past.  No recent fevers, no chills or sweats.   LMP- December 20, 2017.   No wheezing.   Also has skin tags on her face for 3 years.  2 of these are getting longer.  These are the one on the right cheek and one below her left eyebrow.  Her glasses rest on these and she describes constant mild low level irritation/pain   Review of Systems  Constitutional: Negative for chills, fatigue and fever.  HENT: Positive for congestion, postnasal drip, rhinorrhea and sneezing. Negative for sinus pressure, sinus pain and sore throat.   Respiratory: Positive for cough.   Cardiovascular: Negative for chest pain and palpitations.  Gastrointestinal: Negative for abdominal pain, blood in stool, constipation, nausea and vomiting.  Musculoskeletal: Negative for back pain.  Skin: Negative for rash.       Skin tags on her face. See hpi.  Neurological: Negative for dizziness, seizures, weakness, light-headedness and numbness.  Hematological: Negative for adenopathy.  Psychiatric/Behavioral: Negative for behavioral problems, confusion, dysphoric mood and suicidal ideas. The patient is not nervous/anxious.     Past Medical History:  Diagnosis Date  . ADHD (attention deficit hyperactivity disorder)   . Depression   . Precocious puberty    Was followed by endocrinology.     Social History   Socioeconomic History  . Marital status: Single    Spouse name: Not on file  . Number of children: 0  . Years of education: Not on file  . Highest education level: Not on file  Social Needs  . Financial resource strain: Not on file  . Food insecurity - worry: Not on file  . Food insecurity -  inability: Not on file  . Transportation needs - medical: Not on file  . Transportation needs - non-medical: Not on file  Occupational History  . 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 Smoking status: Never Smoker  . Smokeless tobacco: Never Used  Substance and Sexual Activity  . Alcohol use: No    Alcohol/week: 0.0 oz  . Drug use: No  . Sexual activity: Not on file  Other Topics Concern  . Not on file  Social History Narrative   Caffeine Use:  3 daily   Regular Exercise:  2-3 x weekly   Lives with Mom step dad and brother   Attends [employer].   Patient is the granddaughter of [name]          Past Surgical History:  Procedure Laterality Date  . NO PAST SURGERIES      Family History  Problem Relation Age of Onset  . Hypertension Mother   . Diabetes Father   . Heart disease Father   . Cancer Neg Hx     Allergies  Allergen Reactions  . Lupron [Leuprolide Acetate]     Was allergic to a preservative in the Lupron.    Current Outpatient Medications on File Prior to Visit  Medication Sig Dispense Refill  . FLUoxetine (PROZAC) 20 MG tablet Take 1 tablet (20 mg total) by mouth daily. 30 tablet 3  .  topiramate (TOPAMAX) 25 MG tablet Take 1 tablet (25 mg total) 2 (two) times daily by mouth. 60 tablet 3   No current facility-administered medications on file prior to visit.     BP 132/76   Pulse 64   Temp 98.1 F (36.7 C) (Oral)   Resp 16   Ht 5\' 6"  (1.676 m)   Wt 273 lb (123.8 kg)   SpO2 100%   BMI 44.06 kg/m       Objective:   Physical Exam  General  Mental Status - Alert. General Appearance - Well groomed. Not in acute distress.  Skin Rashes- No Rashes.  Patient has allergic salute crease on her nose.  Also a little bit of a scar in her left upper forehead region from  prior curling iron burn.(Appears on inspection she might scar easily)  Patient has 2 very thin but moderately long skin tags one on the right cheek about 2 mm  long and 1 below her left eyebrow about the same appearance.    HEENT Head- Normal. Ear Auditory Canal - Left- Normal. Right - Normal.Tympanic Membrane- Left- Normal. Right- Normal. Eye Sclera/Conjunctiva- Left- Normal. Right- Normal. Nose & Sinuses Nasal Mucosa- Left-  Boggy and Congested. Right-  Boggy and  Congested.Bilateral no maxillary and no frontal sinus pressure. Mouth & Throat Lips: Upper Lip- Normal: no dryness, cracking, pallor, cyanosis, or vesicular eruption. Lower Lip-Normal: no dryness, cracking, pallor, cyanosis or vesicular eruption. Buccal Mucosa- Bilateral- No Aphthous ulcers. Oropharynx- No Discharge or Erythema. Tonsils: Characteristics- Bilateral- No Erythema or Congestion. Size/Enlargement- Bilateral- No enlargement. Discharge- bilateral-None.  Neck Neck- Supple. No Masses.   Chest and Lung Exam Auscultation: Breath Sounds:-Clear even and unlabored.  Cardiovascular Auscultation:Rythm- Regular, rate and rhythm. Murmurs & Other Heart Sounds:Ausculatation of the heart reveal- No Murmurs.  Lymphatic Head & Neck General Head & Neck Lymphatics: Bilateral: Description- No Localized lymphadenopathy.       Assessment & Plan:  You do appear to have allergic rhinitis.  Cough appears his to be associated with postnasal drainage and not infection.  I do think you would benefit from Allegra(change up from your previous Xyzal which was not working.)  [employer].  If after 4 days you do not see much improvement with Flonase then add Astelin.  You did mention it time symptoms worsen with rapid weather changes.  You might have a component of vasomotor rhinitis.  Also for your intermittent cough making benzonatate available.  If by Monday you have worse symptoms such as sinus pressure, productive cough or chest congestion please let me know and in that event would write antibiotic.  Currently no bacterial infection present.  You do have 2 moderately large  skin tags on your face.  Based on the location I think it would be better to have these removed by a dermatologist.  I am concerned that you might have moderate scarring.  I will place that referral and if no one calls you within a week then would recommend that you call and ask for an update from [place].  Follow-up in 7-10 days or as needed.  Esperanza Richters, PA-C

## 2018-01-14 ENCOUNTER — Ambulatory Visit: Payer: Self-pay | Admitting: Physician Assistant

## 2018-02-11 ENCOUNTER — Other Ambulatory Visit: Payer: Self-pay | Admitting: Physician Assistant

## 2018-02-11 MED ORDER — TOPIRAMATE 25 MG PO TABS: 25.0000 mg | ORAL_TABLET | Freq: Two times a day (BID) | ORAL | 0 refills | Status: DC

## 2018-02-12 ENCOUNTER — Telehealth: Payer: Self-pay | Admitting: Physician Assistant

## 2018-02-12 NOTE — Telephone Encounter (Signed)
Copied from CRM [id number]. Topic: Quick Communication - Rx Refill/Question >> Feb 12, 2018  1:17 PM Landry MellowFoltz, Melissa J wrote: Medication: topiramate (TOPAMAX) 25 MG tablet   Has the patient contacted their pharmacy? Yes.     (Agent: If no, request that the patient contact the pharmacy for the refill.)   Preferred Pharmacy (with phone number or street name): [facility] [place] -  [phone]  Clarify that pt is taking both strengths of this med.  The 25 mg and the 100 that they recently filled    Agent: Please be advised that RX refills may take up to 3 business days. We ask that you follow-up with your pharmacy.

## 2018-02-12 NOTE — Telephone Encounter (Signed)
Patient called and spoke to mother who says this is already taken care of by [facility].

## 2018-02-13 NOTE — Telephone Encounter (Signed)
Patient's mother called, left VM to call the office back about the Topamax. Called to inform her that the medication was sent to the pharmacy by the provider, Topamax 25 mg, on 02/11/18 [facility] [address] in [place], [place].

## 2018-02-13 NOTE — Telephone Encounter (Signed)
Annette Molina sent wrong dose, they sent 100mg  on topamax, patient should be taking 25mg , please advise. Call back [phone], mother Annette Molina

## 2018-02-19 ENCOUNTER — Encounter (HOSPITAL_BASED_OUTPATIENT_CLINIC_OR_DEPARTMENT_OTHER): Payer: Self-pay | Admitting: *Deleted

## 2018-02-19 ENCOUNTER — Other Ambulatory Visit: Payer: Self-pay

## 2018-02-19 ENCOUNTER — Emergency Department (HOSPITAL_BASED_OUTPATIENT_CLINIC_OR_DEPARTMENT_OTHER)
Admission: EM | Admit: 2018-02-19 | Discharge: 2018-02-19 | Disposition: A | Discharge status: Home / Self Care | Payer: BLUE CROSS/BLUE SHIELD | Attending: Emergency Medicine | Admitting: Emergency Medicine

## 2018-02-19 ENCOUNTER — Emergency Department (HOSPITAL_BASED_OUTPATIENT_CLINIC_OR_DEPARTMENT_OTHER): Payer: BLUE CROSS/BLUE SHIELD

## 2018-02-19 DIAGNOSIS — S93402A Sprain of unspecified ligament of left ankle, initial encounter: Secondary | ICD-10-CM | POA: Diagnosis not present

## 2018-02-19 DIAGNOSIS — Y929 Unspecified place or not applicable: Secondary | ICD-10-CM | POA: Diagnosis not present

## 2018-02-19 DIAGNOSIS — Z79899 Other long term (current) drug therapy: Secondary | ICD-10-CM | POA: Diagnosis not present

## 2018-02-19 DIAGNOSIS — Y9389 Activity, other specified: Secondary | ICD-10-CM | POA: Diagnosis not present

## 2018-02-19 DIAGNOSIS — S99812A Other specified injuries of left ankle, initial encounter: Secondary | ICD-10-CM | POA: Diagnosis present

## 2018-02-19 DIAGNOSIS — W11XXXA Fall on and from ladder, initial encounter: Secondary | ICD-10-CM | POA: Diagnosis not present

## 2018-02-19 DIAGNOSIS — Y990 Civilian activity done for income or pay: Secondary | ICD-10-CM | POA: Insufficient documentation

## 2018-02-19 MED ORDER — HYDROCODONE-ACETAMINOPHEN 5-325 MG PO TABS
1.0000 | ORAL_TABLET | Freq: Once | ORAL | Status: AC
  Administered 2018-02-19: 1 via ORAL
  Filled 2018-02-19: qty 1

## 2018-02-19 NOTE — ED Triage Notes (Addendum)
Left ankle injury. She was knocked off a 2 step ladder by a customer opening a door causing her to fall and twist her ankle at work today. Pedal pulse palpated. She is not able to apply weight to her foot.

## 2018-02-19 NOTE — Discharge Instructions (Signed)
Follow up with your Primary Care Doctor as needed.  ° Follow up with referred orthopedic doctor in 1-2 weeks if no improvement of pain.  ° °You can take tylenol or ibuprofen as needed for pain. You can alternate Tylenol and Ibuprofen every 4 hours for additional pain relief.  °  °Return to the Emergency Department immediately for any worsening pain, redness/swelling of the ankle, gray or blue color to the toes, numbness/weakness of toes or foot, difficulty walking or any other worsening or concerning symptoms.  ° ° °Ankle sprain °Ankle sprain occurs when the ligaments that hold the ankle joint to get her are stretched or torn. It may take 4-6 weeks to heal. ° °For activity: Use crutches with nonweightbearing for the first few days. Then, you may walk on your ankles as the pain allows, or as instructed. Start gradually with weight bearing on the affected ankle. Once you can walk pain free, then try jogging. When you can run forwards, then you can try moving side to side. If you cannot walk without crutches in one week, you need a recheck by your Family Doctor. ° °If you do not have a family doctor to followup with, you can see the list of phone numbers below. Please call today to make a followup appointment. ° ° °RICE therapy:  Routine Care for injuries ° °Rest, Ice, Compression, Elevation (RICE) ° °Rest is needed to allow your body to heal. Routine activities can be resumed when comfortable. Injury tendons and bones can take up to 6 weeks to heal. Tendons are cordlike structures that attach muscles and bones. ° °Ice following an injury helps keep the swelling down and reduce the pain. Put ice in a plastic bag. Place a towel between your skin and the bag of ice. Leave the ice on for 15-20 minutes, 3-4 times a day. Do this while awake, for the first 24-48 hours. After that continue as directed by your caregiver. ° °Compression helps keep swelling down. It also gives support and helps with discomfort. If any lasting  bandage has been applied, it should be removed and reapplied every 3-4 hours. It should not be applied tightly, but firmly enough to keep swelling down. Watch fingers or toes for swelling, discoloration, coldness, numbness or excessive pain. If any of these problems occur, removed the bandage and reapply loosely. Contact your caregiver if these problems continue. ° °Elevation helps reduce swelling and decrease your pain. With extremities such as the arms, hands, legs and feet, the injured area should be placed near or above the level of the heart if possible. ° ° ° °

## 2018-02-19 NOTE — ED Provider Notes (Signed)
[facility] EMERGENCY DEPARTMENT Provider Note   CSN: [id number] Arrival date & time: 02/19/18  1348     History   Chief Complaint Chief Complaint  Patient presents with  . Ankle Injury    HPI Annette Molina is a 22 y.o. female with PMH/o ADHD who presents for evaluation of left ankle pain that began today after a mechanical fall at work. Patient reports that she was on a ladder that was approximately 53ft step ladder when she got knocked off causing her to fall and land of her left foot. She states that she did not hit her head or have any LOC. She has not been able to ambulate or bear weight since the incident. Patient states that she took ibuprofen prior to ED arrival with no improvement in pain. Her pain is worse with movement of the LLE. Patient denies any numbness/weakness.   The history is provided by the patient.    Past Medical History:  Diagnosis Date  . ADHD (attention deficit hyperactivity disorder)   . Depression   . Precocious puberty    Was followed by endocrinology.    Patient Active Problem List   Diagnosis Date Noted  . Allergic rhinitis 12/03/2017  . Obesity 02/15/2016  . Pseudotumor cerebri 12/13/2015  . Hyperpigmentation 11/17/2015  . Bipolar depression (HCC) 05/27/2015  . Dysmenorrhea in the adolescent 07/04/2011  . ADHD (attention deficit hyperactivity disorder) 06/06/2011    Past Surgical History:  Procedure Laterality Date  . NO PAST SURGERIES       OB History   None      Home Medications    Prior to Admission medications   Medication Sig Start Date End Date Taking? Authorizing Provider  azelastine (ASTELIN) 0.1 % nasal spray Place 2 sprays into both nostrils 2 (two) times daily. Use in each nostril as directed 12/27/17   Saguier, Ramon Dredge, PA-C  benzonatate (TESSALON) 100 MG capsule Take 1 capsule (100 mg total) by mouth 3 (three) times daily as needed for cough. 12/27/17   Saguier, Ramon Dredge, PA-C  fexofenadine (ALLEGRA)  180 MG tablet Take 1 tablet (180 mg total) by mouth daily. 12/27/17   Saguier, Ramon Dredge, PA-C  FLUoxetine (PROZAC) 20 MG tablet Take 1 tablet (20 mg total) by mouth daily. 12/03/17   Waldon Merl, PA-C  fluticasone (FLONASE) 50 MCG/ACT nasal spray Place 2 sprays into both nostrils daily. 12/27/17   Saguier, Ramon Dredge, PA-C  topiramate (TOPAMAX) 25 MG tablet Take 1 tablet (25 mg total) by mouth 2 (two) times daily. 02/11/18   Waldon Merl, PA-C    Family History Family History  Problem Relation Age of Onset  . Hypertension Mother   . Diabetes Father   . Heart disease Father   . Cancer Neg Hx     Social History Social History   Tobacco Use  . Smoking status: Never Smoker  . Smokeless tobacco: Never Used  Substance Use Topics  . Alcohol use: No    Alcohol/week: 0.0 oz  . Drug use: No     Allergies   Lupron [leuprolide acetate]   Review of Systems Review of Systems  Musculoskeletal:       Left ankle pain  Neurological: Negative for weakness and numbness.     Physical Exam Updated Vital Signs BP 103/75 (BP Location: Right Arm)   Pulse 79   Temp 98.1 F (36.7 C) (Oral)   Resp 16   Ht 5\' 6"  (1.676 m)   Wt 123.8 kg (273 lb)  LMP 02/14/2018   SpO2 100%   BMI 44.06 kg/m   Physical Exam  Constitutional: She appears well-developed and well-nourished.  HENT:  Head: Normocephalic and atraumatic.  Eyes: Conjunctivae and EOM are normal. Right eye exhibits no discharge. Left eye exhibits no discharge. No scleral icterus.  Neck: Normal range of motion.  Cardiovascular:  Pulses:      Dorsalis pedis pulses are 2+ on the right side, and 2+ on the left side.  Good doppler DP pulse of the left foot   Pulmonary/Chest: Effort normal.  Musculoskeletal:  Tenderness to palpation to the lateral aspect of the left ankle that extends over the dorsal aspect of the left foot with overlying soft tissue swelling.  No overlying ecchymosis, warmth, or erythema. No deformity or crepitus  noted. Limited ROM of foot secondary to pain but patient can slightly plantar flex and dorsiflex. No abnormalities of the RLE.   Neurological: She is alert.  Sensation intact throughout all major nerve distributions of the feet   Skin: Skin is warm and dry. Capillary refill takes less than 2 seconds.  Good distal cap refill. LLE is not dusky in appearance or cool to touch.  Psychiatric: She has a normal mood and affect. Her speech is normal and behavior is normal.  Nursing note and vitals reviewed.    ED Treatments / Results  Labs (all labs ordered are listed, but only abnormal results are displayed) Labs Reviewed  POC URINE PREG, ED    EKG None  Radiology Dg Ankle Complete Left  Result Date: 02/19/2018 CLINICAL DATA:  Fall from a ladder with left ankle and foot pain. EXAM: LEFT ANKLE COMPLETE - 3+ VIEW COMPARISON:  None. FINDINGS: The ankle is considerably plantar flexed during imaging, and the frontal view attempt is from an unusual orientation. No malleolar fracture identified. No obvious tibiotalar joint effusion. Talar dome appears intact. The anterior calcaneus sits somewhat high in position relative to the cuboid, but this is a chronic finding not appreciably changed from 03/31/2017 and accordingly is not thought to be an acute injury involving the Chopart joint. Correlate with orientation of foot in assessment for talipes varus. IMPRESSION: 1. No acute findings. 2. Difficulty positioning foot, query mild underlying taliped varus, correlate with physical exam findings. Electronically Signed   By: Gaylyn RongWalter  Liebkemann M.D.   On: 02/19/2018 14:40   Dg Foot Complete Left  Result Date: 02/19/2018 CLINICAL DATA:  Fall from a ladder, foot pain. EXAM: LEFT FOOT - COMPLETE 3+ VIEW COMPARISON:  03/31/2017 FINDINGS: No appreciable fracture or acute bony findings. No malalignment at the Lisfranc joint. Somewhat off axis lateral projection, cannot exclude pes cavus. IMPRESSION: 1. No forefoot  fracture is identified. Lisfranc joint alignment normal. Electronically Signed   By: Gaylyn RongWalter  Liebkemann M.D.   On: 02/19/2018 14:42    Procedures Procedures (including critical care time)  Medications Ordered in ED Medications  HYDROcodone-acetaminophen (NORCO/VICODIN) 5-325 MG per tablet 1 tablet (1 tablet Oral Given 02/19/18 1442)     Initial Impression / Assessment and Plan / ED Course  I have reviewed the triage vital signs and the nursing notes.  Pertinent labs & imaging results that were available during my care of the patient were reviewed by me and considered in my medical decision making (see chart for details).     22 y.o. F Presents with left ankle pain after a mechanical fall.   Vital signs reviewed and stable. Patient is neurovascularly intact. Consider sprain vs fracture vs dislocation. History/physical exam  are not concerning for acute arterial embolism, achilles tendon rupture.  XRs ordered. Analgesics provided in the department.  XR reviewed. Negative for any acute fracture or dislocation. Patient has some abnormal alignment of the left ankle but review of records shows that this is consistent with her previous and looks similar to XRs done in May 2018. Radiology question chronic talipe varus.   Explained results to patient and discussed that there could be a ligamentous or muscular injury that cannot be picked up on XR. Plan to send patient home with CAM walker boot as patient states he cannot tolerate a splint and crutches. Provided ortho referral to be seen if there is no improvement in symptoms. Patient had ample opportunity for questions and discussion. All patient's questions were answered with full understanding. Strict return precautions discussed. Patient expresses understanding and agreement to plan.   Final Clinical Impressions(s) / ED Diagnoses   Final diagnoses:  Sprain of left ankle, unspecified ligament, initial encounter    ED Discharge Orders    None         Maxwell Caul, PA-C 02/19/18 1541    Melene Plan, DO 02/20/18 [phone]

## 2018-02-26 ENCOUNTER — Encounter: Payer: Self-pay | Admitting: Family Medicine

## 2018-02-26 ENCOUNTER — Ambulatory Visit (INDEPENDENT_AMBULATORY_CARE_PROVIDER_SITE_OTHER): Payer: Worker's Compensation | Admitting: Family Medicine

## 2018-02-26 DIAGNOSIS — S99912A Unspecified injury of left ankle, initial encounter: Secondary | ICD-10-CM

## 2018-02-26 NOTE — Patient Instructions (Signed)
You have a severe ankle sprain. Ice the area for 15 minutes at a time, 3-4 times a day Aleve 2 tabs twice a day with food OR ibuprofen 3 tabs three times a day with food for pain and inflammation. Elevate above the level of your heart when possible Crutches if needed to help with walking Bear weight when tolerated Use boot when up and walking around to help with stability while you recover from this injury.  If possible in 1 week transition to the laceup brace instead of the boot. Come out of the boot twice a day to do Up/down and alphabet exercises 2-3 sets of each. Start physical therapy for strengthening and balance exercises. If not improving as expected, we may repeat x-rays or consider further testing like an MRI, repeat ultrasound. Follow up in 2 weeks.

## 2018-02-26 NOTE — Progress Notes (Signed)
[facility] [address], [address] [place], [place] [address] [phone] [phone]  Date:  02/27/2018   Name:  Annette Molina   DOB:  01/09/1996   MRN:  [id number]  PCP:  Waldon MerlMartin, William C, PA-C    Chief Complaint: URI (congestion, cough, sore throat, mucous, headache, no fever)   History of Present Illness:  Annette Molina is a 22 y.o. very pleasant female patient who presents with the following:  Here today with concern of illness- pt of Malva CoganCody Martin She has noted symptoms for about a month She has head pressure, and developed green mucus from her nose about one week ago Mild cough, some sore throat  No vomiting, no diarrhea, no fever She has noted left earache as well  Not really sneezing  She has noted some facial pressure She is wearing a book on her left foot right now for an unrelated sprain She has tried several meds at home OTC- nothing seemed to help all that much   LMP was 3/21  She is using delsym at night for her cough Also tried sudafed, nyquil  Accompanied by her mom today who contributes to the history  She does not use topamax for seizures, it is for mental health    Patient Active Problem List   Diagnosis Date Noted  . Left ankle injury, initial encounter 02/27/2018  . Allergic rhinitis 12/03/2017  . Obesity 02/15/2016  . Pseudotumor cerebri 12/13/2015  . Hyperpigmentation 11/17/2015  . Bipolar depression (HCC) 05/27/2015  . Dysmenorrhea in the adolescent 07/04/2011  . ADHD (attention deficit hyperactivity disorder) 06/06/2011    Past Medical History:  Diagnosis Date  . ADHD (attention deficit hyperactivity disorder)   . Depression   . Precocious puberty    Was followed by endocrinology.    Past Surgical History:  Procedure Laterality Date  . NO PAST SURGERIES      Social History   Tobacco Use  . Smoking status: Never Smoker  . Smokeless tobacco: Never Used  Substance Use  Topics  . Alcohol use: No    Alcohol/week: 0.0 oz  . Drug use: No    Family History  Problem Relation Age of Onset  . Hypertension Mother   . Diabetes Father   . Heart disease Father   . Cancer Neg Hx     Allergies  Allergen Reactions  . Lupron [Leuprolide Acetate]     Was allergic to a preservative in the Lupron.    Medication list has been reviewed and updated.  Current Outpatient Medications on File Prior to Visit  Medication Sig Dispense Refill  . topiramate (TOPAMAX) 25 MG tablet Take 1 tablet (25 mg total) by mouth 2 (two) times daily. 60 tablet 0   No current facility-administered medications on file prior to visit.     Review of Systems:  As per HPI- otherwise negative.   Physical Examination: Vitals:   02/27/18 1550  BP: 110/70  Pulse: 80  Resp: 16  Temp: 98.1 F (36.7 C)  SpO2: 98%   Vitals:   There is no height or weight on file to calculate BMI. Ideal Body Weight:    GEN: WDWN, NAD, Non-toxic, A & O x 3, obese, looks well  HEENT: Atraumatic, Normocephalic. Neck supple. No masses, No LAD.  Bilateral TM wnl, oropharynx normal.  PEERL,EOMI.   Sinuses are tender to percussion Ears and Nose: No external deformity. CV: RRR, No M/G/R. No JVD. No thrill. No  extra heart sounds. PULM: CTA B, no wheezes, crackles, rhonchi. No retractions. No resp. distress. No accessory muscle use. ABD: S, NT, ND, +BS. No rebound. No HSM. EXTR: No c/c/e NEURO Normal gait.  PSYCH: Normally interactive. Conversant. Not depressed or anxious appearing.  Calm demeanor.    Assessment and Plan: Acute non-recurrent frontal sinusitis - Plan: amoxicillin (AMOXIL) 500 MG capsule  Treat for sinusitis with amoxicillin She will alert me if not feeling better soon Would like to transfer care to this office- they love [facility] but traveling to [place] is too far. This is fine, I am happy to see her  Signed Abbe Amsterdam, MD

## 2018-02-27 ENCOUNTER — Encounter: Payer: Self-pay | Admitting: Family Medicine

## 2018-02-27 ENCOUNTER — Ambulatory Visit (INDEPENDENT_AMBULATORY_CARE_PROVIDER_SITE_OTHER): Payer: BLUE CROSS/BLUE SHIELD | Admitting: Family Medicine

## 2018-02-27 VITALS — BP 110/70 | HR 80 | Temp 98.1°F | Resp 16

## 2018-02-27 DIAGNOSIS — S99912D Unspecified injury of left ankle, subsequent encounter: Secondary | ICD-10-CM | POA: Insufficient documentation

## 2018-02-27 DIAGNOSIS — J0110 Acute frontal sinusitis, unspecified: Secondary | ICD-10-CM

## 2018-02-27 MED ORDER — AMOXICILLIN 500 MG PO CAPS: 1000.0000 mg | ORAL_CAPSULE | Freq: Two times a day (BID) | ORAL | 0 refills | Status: DC

## 2018-02-27 NOTE — Progress Notes (Signed)
PCP: Waldon MerlMartin, William C, PA-C  Subjective:   HPI: Patient is a 22 y.o. female here for left ankle injury.  Patient reports on March 26 she was about 3 rungs up on a ladder when she fell off and inverted her left ankle. She could not bear weight immediately after this. Pain is 0 out of 10 at rest but up to 9-10 out of 10 with walking. Pain is lateral and anterior ankle joint. Has been wearing a Cam walker and icing. She took Norco initially from the emergency department. She had no pain preceding this injury she does report prior ankle injuries. No skin changes, numbness.  Past Medical History:  Diagnosis Date  . ADHD (attention deficit hyperactivity disorder)   . Depression   . Precocious puberty    Was followed by endocrinology.    Current Outpatient Medications on File Prior to Visit  Medication Sig Dispense Refill  . azelastine (ASTELIN) 0.1 % nasal spray Place 2 sprays into both nostrils 2 (two) times daily. Use in each nostril as directed 30 mL 2  . fexofenadine (ALLEGRA) 180 MG tablet Take 1 tablet (180 mg total) by mouth daily. 30 tablet 2  . FLUoxetine (PROZAC) 20 MG tablet Take 1 tablet (20 mg total) by mouth daily. 30 tablet 3  . fluticasone (FLONASE) 50 MCG/ACT nasal spray Place 2 sprays into both nostrils daily. 16 g 1  . topiramate (TOPAMAX) 25 MG tablet Take 1 tablet (25 mg total) by mouth 2 (two) times daily. 60 tablet 0   No current facility-administered medications on file prior to visit.     Past Surgical History:  Procedure Laterality Date  . NO PAST SURGERIES      Allergies  Allergen Reactions  . Lupron [Leuprolide Acetate]     Was allergic to a preservative in the Lupron.    Social History   Socioeconomic History  . Marital status: Single    Spouse name: Not on file  . Number of children: 0  . Years of education: Not on file  . Highest education level: Not on file  Occupational History  . 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 Financial resource strain: Not on file  . Food insecurity:    Worry: Not on file    Inability: Not on file  . Transportation needs:    Medical: Not on file    Non-medical: Not on file  Tobacco Use  . Smoking status: Never Smoker  . Smokeless tobacco: Never Used  Substance and Sexual Activity  . Alcohol use: No    Alcohol/week: 0.0 oz  . Drug use: No  . Sexual activity: Not on file  Lifestyle  . Physical activity:    Days per week: Not on file    Minutes per session: Not on file  . Stress: Not on file  Relationships  . Social connections:    Talks on phone: Not on file    Gets together: Not on file    Attends religious service: Not on file    Active member of club or organization: Not on file    Attends meetings of clubs or organizations: Not on file    Relationship status: Not on file  . Intimate partner violence:    Fear of current or ex partner: Not on file    Emotionally abused: Not on file    Physically abused: Not on file    Forced sexual activity: Not on file  Other Topics Concern  . Not on file  Social History Narrative   Caffeine Use:  3 daily   Regular Exercise:  2-3 x weekly   Lives with Mom step dad and brother   Attends [employer].   Patient is the granddaughter of [name]          Family History  Problem Relation Age of Onset  . Hypertension Mother   . Diabetes Father   . Heart disease Father   . Cancer Neg Hx     BP 106/75   Pulse 92   Ht 5\' 6"  (1.676 m)   Wt 250 lb (113.4 kg)   LMP 02/14/2018   BMI 40.35 kg/m   Review of Systems: See HPI above.     Objective:  Physical Exam:  Gen: NAD, comfortable in exam room  Left ankle: No gross deformity, swelling, ecchymoses Mod limitation motions all directions.  5/5 strength with pain ER. TTP anterior ankle joint, over ATFL, lateral malleolus, peroneal tendons.  No base 5th, other tenderness. Guarding with ant drawer and talar tilt, both painful. Negative syndesmotic  compression. Thompsons test negative. NV intact distally.  Right ankle: No deformity. FROM with 5/5 strength. No tenderness to palpation. NVI distally.   MSK u/s left ankle: Very thickened peroneus longus and brevis tendons, also noted right ankle though as well.  No evidence of full-thickness tear.  No cortical irregularity of distal fibula or fifth metatarsal.  Assessment & Plan:  1.  Left ankle injury: Independently reviewed radiographs no evidence of fracture.  Musculoskeletal ultrasound today is reassuring as well.  Consistent with severe lateral ankle sprain.  She will continue with the Cam walker and transition to an ASO when tolerated over the next 1-2 weeks.  Icing, Aleve or ibuprofen.  Crutches if needed.  Shown home motion exercises to start daily.  She will start physical therapy as well.  Follow-up in 2 weeks.  Letter for work restrictions printed.

## 2018-02-27 NOTE — Patient Instructions (Signed)
We are going to treat you for a sinus infection today with amoxicillin  Please use a daily OTC probiotic or eat yogurt to help prevent diarrhea Let me know if you are not feeling better in the next few days- Sooner if worse.

## 2018-02-27 NOTE — Assessment & Plan Note (Signed)
Independently reviewed radiographs no evidence of fracture.  Musculoskeletal ultrasound today is reassuring as well.  Consistent with severe lateral ankle sprain.  She will continue with the Cam walker and transition to an ASO when tolerated over the next 1-2 weeks.  Icing, Aleve or ibuprofen.  Crutches if needed.  Shown home motion exercises to start daily.  She will start physical therapy as well.  Follow-up in 2 weeks.  Letter for work restrictions printed.

## 2018-03-07 NOTE — Addendum Note (Signed)
Addended by: Kathi SimpersWISE, Aylinn Rydberg F on: 03/07/2018 09:17 AM   Modules accepted: Orders

## 2018-03-12 ENCOUNTER — Ambulatory Visit (INDEPENDENT_AMBULATORY_CARE_PROVIDER_SITE_OTHER): Payer: Worker's Compensation | Admitting: Family Medicine

## 2018-03-12 ENCOUNTER — Encounter: Payer: Self-pay | Admitting: Family Medicine

## 2018-03-12 DIAGNOSIS — S99912D Unspecified injury of left ankle, subsequent encounter: Secondary | ICD-10-CM | POA: Diagnosis not present

## 2018-03-12 NOTE — Patient Instructions (Signed)
You have a severe ankle sprain. Ice the area for 15 minutes at a time, 3-4 times a day Aleve 2 tabs twice a day with food OR ibuprofen 3 tabs three times a day with food for pain and inflammation as needed. Elevate above the level of your heart when possible Use boot when up and walking around to help with stability while you recover from this injury.  Transition to the laceup brace as soon as possible - try this around the house.  If you're struggling to do this therapy will work with you to transition into it. Come out of the boot/brace twice a day to do Up/down and alphabet exercises 2-3 sets of each. Continue physical therapy, do home exercises on days you don't go to therapy. Follow up in 4 weeks. If not improving we will consider an MRI.

## 2018-03-13 ENCOUNTER — Encounter: Payer: Self-pay | Admitting: Family Medicine

## 2018-03-13 NOTE — Assessment & Plan Note (Signed)
Radiographs negative for fracture, ultrasound reassuring.  2/2 severe lateral ankle sprain.  Will change PT locations, continue home exercises.  Aleve or ibuprofen, icing, elevation.  Try to transition to ASO.  F/u in 4 weeks.  Consider MRI if not improving as expected.  Work note provided.

## 2018-03-13 NOTE — Progress Notes (Signed)
PCP: Waldon MerlMartin, William C, PA-C  Subjective:   HPI: Patient is a 22 y.o. female here for left ankle injury.  4/2: Patient reports on March 26 she was about 3 rungs up on a ladder when she fell off and inverted her left ankle. She could not bear weight immediately after this. Pain is 0 out of 10 at rest but up to 9-10 out of 10 with walking. Pain is lateral and anterior ankle joint. Has been wearing a Cam walker and icing. She took Norco initially from the emergency department. She had no pain preceding this injury she does report prior ankle injuries. No skin changes, numbness.  4/16: Patient reports she's improving. Pain level down to 3/10 at worst now, a soreness after walking. Still wearing boot - worse pain when trying to use ASO. Did one visit of physical therapy, doing home exercises. Planning to change location of physical therapy - one she was sent to was in someone's house. No skin changes, numbness.  Past Medical History:  Diagnosis Date  . ADHD (attention deficit hyperactivity disorder)   . Depression   . Precocious puberty    Was followed by endocrinology.    Current Outpatient Medications on File Prior to Visit  Medication Sig Dispense Refill  . amoxicillin (AMOXIL) 500 MG capsule Take 2 capsules (1,000 mg total) by mouth 2 (two) times daily. 40 capsule 0  . topiramate (TOPAMAX) 25 MG tablet Take 1 tablet (25 mg total) by mouth 2 (two) times daily. 60 tablet 0   No current facility-administered medications on file prior to visit.     Past Surgical History:  Procedure Laterality Date  . NO PAST SURGERIES      Allergies  Allergen Reactions  . Lupron [Leuprolide Acetate]     Was allergic to a preservative in the Lupron.    Social History   Socioeconomic History  . Marital status: Single    Spouse name: Not on file  . Number of children: 0  . Years of education: Not on file  . Highest education level: Not on file  Occupational History  . 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 Financial resource strain: Not on file  . Food insecurity:    Worry: Not on file    Inability: Not on file  . Transportation needs:    Medical: Not on file    Non-medical: Not on file  Tobacco Use  . Smoking status: Never Smoker  . Smokeless tobacco: Never Used  Substance and Sexual Activity  . Alcohol use: No    Alcohol/week: 0.0 oz  . Drug use: No  . Sexual activity: Not on file  Lifestyle  . Physical activity:    Days per week: Not on file    Minutes per session: Not on file  . Stress: Not on file  Relationships  . Social connections:    Talks on phone: Not on file    Gets together: Not on file    Attends religious service: Not on file    Active member of club or organization: Not on file    Attends meetings of clubs or organizations: Not on file    Relationship status: Not on file  . Intimate partner violence:    Fear of current or ex partner: Not on file    Emotionally abused: Not on file    Physically abused: Not on file    Forced sexual activity: Not on file  Other  Topics Concern  . Not on file  Social History Narrative   Caffeine Use:  3 daily   Regular Exercise:  2-3 x weekly   Lives with Mom step dad and brother   Attends [employer].   Patient is the granddaughter of [name]          Family History  Problem Relation Age of Onset  . Hypertension Mother   . Diabetes Father   . Heart disease Father   . Cancer Neg Hx     BP (!) 89/63   Pulse 83   Ht 5\' 6"  (1.676 m)   Wt 265 lb (120.2 kg)   LMP 02/14/2018   BMI 42.77 kg/m   Review of Systems: See HPI above.     Objective:  Physical Exam:  Gen: NAD, comfortable in exam room  Left ankle: No gross deformity, swelling, ecchymoses Mod limitation ROM all directions but 5/5 strength. TTP anterior ankle joint, over ATFL.  No other tenderness. 2+ ant drawer and talar tilt.  Negative syndesmotic compression. Thompsons test negative. NV intact  distally.  Assessment & Plan:  1.  Left ankle injury: Radiographs negative for fracture, ultrasound reassuring.  2/2 severe lateral ankle sprain.  Will change PT locations, continue home exercises.  Aleve or ibuprofen, icing, elevation.  Try to transition to ASO.  F/u in 4 weeks.  Consider MRI if not improving as expected.  Work note provided.

## 2018-04-08 ENCOUNTER — Encounter: Payer: Worker's Compensation | Admitting: Family Medicine

## 2018-04-17 ENCOUNTER — Encounter: Payer: Self-pay | Admitting: Physician Assistant

## 2018-04-27 ENCOUNTER — Encounter: Payer: Self-pay | Admitting: Family Medicine

## 2018-04-29 ENCOUNTER — Ambulatory Visit: Payer: BLUE CROSS/BLUE SHIELD | Admitting: Family Medicine

## 2018-04-29 NOTE — Progress Notes (Deleted)
[facility] [address], [address] [place], [place] [address] [phone] [phone]  Date:  04/29/2018   Name:  Annette Molina   DOB:  07/07/1996   MRN:  [id number]  PCP:  Waldon MerlMartin, William C, PA-C    Chief Complaint: No chief complaint on file.   History of Present Illness:  Annette Molina is a [age] y.o. very pleasant female patient who presents with the following:  Pt of Selena BattenCody, here today with concern of   Patient Active Problem List   Diagnosis Date Noted  . Left ankle injury, subsequent encounter 02/27/2018  . Allergic rhinitis 12/03/2017  . Obesity 02/15/2016  . Pseudotumor cerebri 12/13/2015  . Hyperpigmentation 11/17/2015  . Bipolar depression (HCC) 05/27/2015  . Dysmenorrhea in the adolescent 07/04/2011  . ADHD (attention deficit hyperactivity disorder) 06/06/2011    Past Medical History:  Diagnosis Date  . ADHD (attention deficit hyperactivity disorder)   . Depression   . Precocious puberty    Was followed by endocrinology.    Past Surgical History:  Procedure Laterality Date  . NO PAST SURGERIES      Social History   Tobacco Use  . Smoking status: Never Smoker  . Smokeless tobacco: Never Used  Substance Use Topics  . Alcohol use: No    Alcohol/week: 0.0 oz  . Drug use: No    Family History  Problem Relation Age of Onset  . Hypertension Mother   . Diabetes Father   . Heart disease Father   . Cancer Neg Hx     Allergies  Allergen Reactions  . Lupron [Leuprolide Acetate]     Was allergic to a preservative in the Lupron.    Medication list has been reviewed and updated.  Current Outpatient Medications on File Prior to Visit  Medication Sig Dispense Refill  . amoxicillin (AMOXIL) 500 MG capsule Take 2 capsules (1,000 mg total) by mouth 2 (two) times daily. 40 capsule 0  . topiramate (TOPAMAX) 25 MG tablet Take 1 tablet (25 mg total) by mouth 2 (two) times daily. 60 tablet 0   No current  facility-administered medications on file prior to visit.     Review of Systems:  ***  Physical Examination: There were no vitals filed for this visit. There were no vitals filed for this visit. There is no height or weight on file to calculate BMI. Ideal Body Weight:    ***  Assessment and Plan: ***  Signed Abbe AmsterdamJessica , MD

## 2018-05-21 ENCOUNTER — Encounter: Payer: Self-pay | Admitting: Family Medicine

## 2018-06-05 ENCOUNTER — Other Ambulatory Visit: Payer: Self-pay

## 2018-06-05 ENCOUNTER — Encounter: Payer: Self-pay | Admitting: Physician Assistant

## 2018-06-05 ENCOUNTER — Ambulatory Visit (INDEPENDENT_AMBULATORY_CARE_PROVIDER_SITE_OTHER): Payer: BLUE CROSS/BLUE SHIELD | Admitting: Physician Assistant

## 2018-06-05 VITALS — BP 110/80 | HR 87 | Temp 98.3°F | Resp 16 | Ht 66.0 in | Wt 268.0 lb

## 2018-06-05 DIAGNOSIS — F988 Other specified behavioral and emotional disorders with onset usually occurring in childhood and adolescence: Secondary | ICD-10-CM

## 2018-06-05 DIAGNOSIS — B9789 Other viral agents as the cause of diseases classified elsewhere: Secondary | ICD-10-CM | POA: Diagnosis not present

## 2018-06-05 DIAGNOSIS — J069 Acute upper respiratory infection, unspecified: Secondary | ICD-10-CM | POA: Diagnosis not present

## 2018-06-05 MED ORDER — AMPHETAMINE-DEXTROAMPHET ER 25 MG PO CP24: 25.0000 mg | ORAL_CAPSULE | ORAL | 0 refills | Status: DC

## 2018-06-05 MED ORDER — FLUTICASONE PROPIONATE 50 MCG/ACT NA SUSP: 2.0000 | Freq: Every day | NASAL | 6 refills | Status: DC

## 2018-06-05 MED ORDER — BENZONATATE 100 MG PO CAPS: 100.0000 mg | ORAL_CAPSULE | Freq: Two times a day (BID) | ORAL | 0 refills | Status: DC | PRN

## 2018-06-05 NOTE — Assessment & Plan Note (Signed)
Will start trial of Adderall XR 25 mg daily. Follow-up 1 month for reassessment.

## 2018-06-05 NOTE — Assessment & Plan Note (Signed)
Examination without signs concerning for bacterial infection. Start supportive measures and OTC medications. Rx Tessalon perles to help with cough. Return precautions reviewed with patient.

## 2018-06-05 NOTE — Progress Notes (Signed)
Patient presents to clinic today c/o 3 days of nasal congestion, dry cough and fatigue. Denies recent travel or sick contact. Denies chest congestion, SOB, sore throat, ear pain, sinus pain, LH or dizziness. Denies fever.   Patient also wanting to discuss restarting medication for her ADD. Mother is present for interview and examination today. Notes patient was diagnosed with ADD in elementary school. Was originally on non-stimulant medication which has a profound negative effect on mood. Refrained from medication until high school due to this side effect. Was then placed on Vyvanse which mother and patient noted made a significant difference in attention, mood and grades. She unfortunately started developing a verbal tic with this medication which was affecting self-esteem, etc. They stopped medication with resolution of tic. Patient has been doing ok overall until trying to enter the workforce. Is having significant issue with focus at work. She is not able to keep a job because of this.  Past Medical History:  Diagnosis Date  . ADHD (attention deficit hyperactivity disorder)   . Depression   . Precocious puberty    Was followed by endocrinology.    Current Outpatient Medications on File Prior to Visit  Medication Sig Dispense Refill  . topiramate (TOPAMAX) 25 MG tablet Take 1 tablet (25 mg total) by mouth 2 (two) times daily. 60 tablet 0   No current facility-administered medications on file prior to visit.     Allergies  Allergen Reactions  . Lupron [Leuprolide Acetate]     Was allergic to a preservative in the Lupron.    Family History  Problem Relation Age of Onset  . Hypertension Mother   . Diabetes Father   . Heart disease Father   . Cancer Neg Hx     Social History   Socioeconomic History  . Marital status: Single    Spouse name: Not on file  . Number of children: 0  . Years of education: Not on file  . Highest education level: Not on file  Occupational History    . 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 Financial resource strain: Not on file  . Food insecurity:    Worry: Not on file    Inability: Not on file  . Transportation needs:    Medical: Not on file    Non-medical: Not on file  Tobacco Use  . Smoking status: Never Smoker  . Smokeless tobacco: Never Used  Substance and Sexual Activity  . Alcohol use: No    Alcohol/week: 0.0 oz  . Drug use: No  . Sexual activity: Not on file  Lifestyle  . Physical activity:    Days per week: Not on file    Minutes per session: Not on file  . Stress: Not on file  Relationships  . Social connections:    Talks on phone: Not on file    Gets together: Not on file    Attends religious service: Not on file    Active member of club or organization: Not on file    Attends meetings of clubs or organizations: Not on file    Relationship status: Not on file  Other Topics Concern  . Not on file  Social History Narrative   Caffeine Use:  3 daily   Regular Exercise:  2-3 x weekly   Lives with Mom step dad and brother   Attends [employer].   Patient is the granddaughter of [name]  Review of Systems - See HPI.  All other ROS are negative.  BP 110/80   Pulse 87   Temp 98.3 F (36.8 C) (Oral)   Resp 16   Ht 5\' 6"  (1.676 m)   Wt 268 lb (121.6 kg)   SpO2 99%   BMI 43.26 kg/m   Physical Exam  Constitutional: She is oriented to person, place, and time. She appears well-developed and well-nourished.  HENT:  Head: Normocephalic and atraumatic.  Right Ear: External ear normal.  Left Ear: External ear normal.  Nose: Nose normal.  Mouth/Throat: Oropharynx is clear and moist. No oropharyngeal exudate.  Eyes: Conjunctivae are normal.  Neck: Neck supple.  Cardiovascular: Normal rate, regular rhythm and normal heart sounds.  Pulmonary/Chest: Effort normal and breath sounds normal. No stridor. No respiratory distress. She has no wheezes. She has no rales. She exhibits  no tenderness.  Neurological: She is alert and oriented to person, place, and time.  Psychiatric: She has a normal mood and affect.  Vitals reviewed.  Assessment/Plan: Viral URI with cough Examination without signs concerning for bacterial infection. Start supportive measures and OTC medications. Rx Tessalon perles to help with cough. Return precautions reviewed with patient.   Attention deficit disorder (ADD) without hyperactivity Will start trial of Adderall XR 25 mg daily. Follow-up 1 month for reassessment.     [name], PA-C

## 2018-06-05 NOTE — Patient Instructions (Signed)
Please keep well-hydrated and get plenty of rest.  Take Delsym for cough. Can use the Tessalon prescription as well.  Start the [place]on[place], using once daily as directed.   Let me know if symptoms are not improving.  Please start the Adderall taking as directed. Follow-up with me in 3-4 weeks for reassessment.

## 2018-06-06 ENCOUNTER — Other Ambulatory Visit: Payer: Self-pay | Admitting: Physician Assistant

## 2018-06-10 ENCOUNTER — Encounter: Payer: Self-pay | Admitting: Physician Assistant

## 2018-06-10 ENCOUNTER — Telehealth: Payer: Self-pay | Admitting: Physician Assistant

## 2018-06-10 NOTE — Telephone Encounter (Unsigned)
Copied from CRM [id number]. Topic: Quick Communication - See Telephone Encounter >> Jun 10, 2018  9:33 AM Raquel SarnaHayes, Teresa G wrote: Needing Dr. Daphine DeutscherMartin or his nurse to write a note and or call her employer to let them know she is ok to start work. Please call pt back to discuss.  She needs to go into work today at 3 pm.

## 2018-06-10 NOTE — Telephone Encounter (Signed)
This has already been taken care of this morning -- note sent to her mychart. Instructed her I could fax as well if she gives me the fax number.

## 2018-06-10 NOTE — Telephone Encounter (Signed)
Advised patient that the letter is on her mychart. Advised she can print out to take to work or if she needs us to fax just give us the fax number and we can fax. Patient is understanding

## 2018-06-20 ENCOUNTER — Encounter: Payer: Self-pay | Admitting: Physician Assistant

## 2018-07-01 ENCOUNTER — Encounter: Payer: Self-pay | Admitting: Physician Assistant

## 2018-07-03 ENCOUNTER — Ambulatory Visit: Payer: Self-pay | Admitting: Physician Assistant

## 2018-07-05 ENCOUNTER — Encounter: Payer: Self-pay | Admitting: Physician Assistant

## 2018-07-05 ENCOUNTER — Ambulatory Visit: Payer: BLUE CROSS/BLUE SHIELD | Admitting: Physician Assistant

## 2018-07-05 ENCOUNTER — Ambulatory Visit (INDEPENDENT_AMBULATORY_CARE_PROVIDER_SITE_OTHER): Payer: BLUE CROSS/BLUE SHIELD | Admitting: Physician Assistant

## 2018-07-05 VITALS — BP 120/60 | HR 80 | Temp 98.4°F | Resp 16 | Ht 66.0 in | Wt 270.2 lb

## 2018-07-05 DIAGNOSIS — F988 Other specified behavioral and emotional disorders with onset usually occurring in childhood and adolescence: Secondary | ICD-10-CM | POA: Diagnosis not present

## 2018-07-05 MED ORDER — ATOMOXETINE HCL 25 MG PO CAPS: 25.0000 mg | ORAL_CAPSULE | Freq: Every day | ORAL | 1 refills | Status: DC

## 2018-07-05 NOTE — Progress Notes (Signed)
Patient presents to clinic today for follow-up after starting Adderral XR daily. Is not taking medication as directed. Notes after taking for a few days she developed a tic that would not go away when she took the medication. After stopping the tic has completely resolved. She would like to discuss other options today.    Past Medical History:  Diagnosis Date  . ADHD (attention deficit hyperactivity disorder)   . Depression   . Precocious puberty    Was followed by endocrinology.    Current Outpatient Medications on File Prior to Visit  Medication Sig Dispense Refill  . fluticasone (FLONASE) 50 MCG/ACT nasal spray Place 2 sprays into both nostrils daily. 16 g 6  . topiramate (TOPAMAX) 25 MG tablet TAKE 1 TABLET BY MOUTH TWICE A DAY 180 tablet 0   No current facility-administered medications on file prior to visit.     Allergies  Allergen Reactions  . Lupron [Leuprolide Acetate]     Was allergic to a preservative in the Lupron.    Family History  Problem Relation Age of Onset  . Hypertension Mother   . Diabetes Father   . Heart disease Father   . Cancer Neg Hx     Social History   Socioeconomic History  . Marital status: Single    Spouse name: Not on file  . Number of children: 0  . Years of education: Not on file  . Highest education level: Not on file  Occupational History  . 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 Financial resource strain: Not on file  . Food insecurity:    Worry: Not on file    Inability: Not on file  . Transportation needs:    Medical: Not on file    Non-medical: Not on file  Tobacco Use  . Smoking status: Never Smoker  . Smokeless tobacco: Never Used  Substance and Sexual Activity  . Alcohol use: No    Alcohol/week: 0.0 standard drinks  . Drug use: No  . Sexual activity: Not on file  Lifestyle  . Physical activity:    Days per week: Not on file    Minutes per session: Not on file  . Stress: Not on  file  Relationships  . Social connections:    Talks on phone: Not on file    Gets together: Not on file    Attends religious service: Not on file    Active member of club or organization: Not on file    Attends meetings of clubs or organizations: Not on file    Relationship status: Not on file  Other Topics Concern  . Not on file  Social History Narrative   Caffeine Use:  3 daily   Regular Exercise:  2-3 x weekly   Lives with Mom step dad and brother   Attends [employer].   Patient is the granddaughter of [name]         Review of Systems - See HPI.  All other ROS are negative.  BP 120/60 (BP Location: Left Arm, Patient Position: Sitting, Cuff Size: Normal)   Pulse 80   Temp 98.4 F (36.9 C) (Oral)   Resp 16   Ht 5\' 6"  (1.676 m)   Wt 270 lb 4 oz (122.6 kg)   LMP 07/04/2018   SpO2 99%   BMI 43.62 kg/m   Physical Exam  Constitutional: She appears well-developed and well-nourished.  HENT:  Head: Normocephalic and atraumatic.  Eyes: Conjunctivae and EOM are normal.  Cardiovascular: Normal rate, regular rhythm, normal heart sounds and intact distal pulses.  Pulmonary/Chest: Effort normal.  Skin: Skin is warm and dry.  Psychiatric: She has a normal mood and affect.  Vitals reviewed.  Assessment/Plan: Attention deficit disorder (ADD) without hyperactivity Stop Adderall. Start Strattera at 25 mg daily. Follow-up 1 month.     [name], PA-C

## 2018-07-05 NOTE — Assessment & Plan Note (Signed)
Stop Adderall. Start Strattera at 25 mg daily. Follow-up 1 month.

## 2018-07-05 NOTE — Patient Instructions (Signed)
Please start the Strattera as directed, taking daily. Keep up with diet and exercise as this helps with anxiety and focus.  Make sure to eat breakfast every day to fuel your body and mind. Call me and let me know how things are going with this.  Keep skin clean and dry. Continue the steroid cream twice daily for up to 2 weeks. Witch hazel may also be soothing.

## 2018-07-30 ENCOUNTER — Ambulatory Visit: Payer: BLUE CROSS/BLUE SHIELD | Admitting: Physician Assistant

## 2018-08-08 ENCOUNTER — Encounter: Payer: Self-pay | Admitting: Family Medicine

## 2018-08-08 ENCOUNTER — Ambulatory Visit (INDEPENDENT_AMBULATORY_CARE_PROVIDER_SITE_OTHER): Payer: BLUE CROSS/BLUE SHIELD | Admitting: Family Medicine

## 2018-08-08 ENCOUNTER — Other Ambulatory Visit: Payer: Self-pay

## 2018-08-08 VITALS — BP 120/76 | HR 89 | Temp 98.5°F | Ht 66.0 in | Wt 271.6 lb

## 2018-08-08 DIAGNOSIS — N898 Other specified noninflammatory disorders of vagina: Secondary | ICD-10-CM

## 2018-08-08 NOTE — Patient Instructions (Signed)
You should do fine.  You may use advil.

## 2018-08-08 NOTE — Progress Notes (Signed)
   Subjective  CC:  Chief Complaint  Patient presents with  . Vaginal Pain    Used vagisil and patient states she is having pain & irritation x 2 days    HPI: Annette Molina is a 22 y.o. female who presents to the office today to address the problems listed above in the chief complaint.  22 yo not sexually active ever female presents due to vaginal "pain" with bms. This started after applying vagisil to external vaginal area - was told by her cousin that it helps make her smell good. She denies problems prior to use of vagisil. No odor, vaginal discharge, rash, dysuria, itching or sores. No pain now. Normal bms.    Assessment  1. Vaginal irritation      Plan   reassured:  No rash or sores present. rec dove soap and water and advil if needed.   Follow up: Return if symptoms worsen or fail to improve.   No orders of the defined types were placed in this encounter.  No orders of the defined types were placed in this encounter.     I reviewed the patients updated PMH, FH, and SocHx.    Patient Active Problem List   Diagnosis Date Noted  . Viral URI with cough 06/05/2018  . Left ankle injury, subsequent encounter 02/27/2018  . Allergic rhinitis 12/03/2017  . Obesity 02/15/2016  . Pseudotumor cerebri 12/13/2015  . Hyperpigmentation 11/17/2015  . Bipolar depression (HCC) 05/27/2015  . Dysmenorrhea in the adolescent 07/04/2011  . 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 atomoxetine (STRATTERA) 25 MG capsule Take 1 capsule (25 mg total) by mouth daily.  Marland Kitchen. topiramate (TOPAMAX) 25 MG tablet TAKE 1 TABLET BY MOUTH TWICE A DAY    Allergies: Patient is allergic to lupron [leuprolide acetate]. Family History: Patient family history includes Diabetes in her father; Heart disease in her father; Hypertension in her mother. Social History:  Patient  reports that she has never smoked. She has never used smokeless  tobacco. She reports that she does not drink alcohol or use drugs.  Review of Systems: Constitutional: Negative for fever malaise or anorexia Cardiovascular: negative for chest pain Respiratory: negative for SOB or persistent cough Gastrointestinal: negative for abdominal pain  Objective  Vitals: BP 120/76   Pulse 89   Temp 98.5 F (36.9 C)   Ht 5\' 6"  (1.676 m)   Wt 271 lb 9.6 oz (123.2 kg)   SpO2 99%   BMI 43.84 kg/m  General: no acute distress , A&Ox3 GYN: normal appearing introitus w/o rash or erythema or sores or discharge    Commons side effects, risks, benefits, and alternatives for medications and treatment plan prescribed today were discussed, and the patient expressed understanding of the given instructions. Patient is instructed to call or message via MyChart if he/she has any questions or concerns regarding our treatment plan. No barriers to understanding were identified. We discussed Red Flag symptoms and signs in detail. Patient expressed understanding regarding what to do in case of urgent or emergency type symptoms.   Medication list was reconciled, printed and provided to the patient in AVS. Patient instructions and summary information was reviewed with the patient as documented in the AVS. This note was prepared with assistance of Dragon voice recognition software. Occasional wrong-word or sound-a-like substitutions may have occurred due to the inherent limitations of voice recognition software

## 2018-08-13 ENCOUNTER — Other Ambulatory Visit: Payer: Self-pay

## 2018-08-13 ENCOUNTER — Encounter: Payer: Self-pay | Admitting: Physician Assistant

## 2018-08-13 ENCOUNTER — Ambulatory Visit: Payer: BLUE CROSS/BLUE SHIELD | Admitting: Physician Assistant

## 2018-08-13 ENCOUNTER — Ambulatory Visit (INDEPENDENT_AMBULATORY_CARE_PROVIDER_SITE_OTHER): Payer: BLUE CROSS/BLUE SHIELD | Admitting: Physician Assistant

## 2018-08-13 VITALS — BP 120/82 | HR 76 | Temp 97.4°F | Resp 16 | Ht 66.0 in | Wt 268.0 lb

## 2018-08-13 DIAGNOSIS — J0100 Acute maxillary sinusitis, unspecified: Secondary | ICD-10-CM | POA: Diagnosis not present

## 2018-08-13 MED ORDER — DOXYCYCLINE HYCLATE 100 MG PO CAPS: 100.0000 mg | ORAL_CAPSULE | Freq: Two times a day (BID) | ORAL | 0 refills | Status: DC

## 2018-08-13 NOTE — Patient Instructions (Signed)
Please take antibiotic as directed.  Increase fluid intake.  Use Saline nasal spray.  Take a daily multivitamin. Continue your saline nasal rinses and warm compresses as needed.  Place a humidifier in the bedroom.  Please call or return clinic if symptoms are not improving.  Sinusitis Sinusitis is redness, soreness, and swelling (inflammation) of the paranasal sinuses. Paranasal sinuses are air pockets within the bones of your face (beneath the eyes, the middle of the forehead, or above the eyes). In healthy paranasal sinuses, mucus is able to drain out, and air is able to circulate through them by way of your nose. However, when your paranasal sinuses are inflamed, mucus and air can become trapped. This can allow bacteria and other germs to grow and cause infection. Sinusitis can develop quickly and last only a short time (acute) or continue over a long period (chronic). Sinusitis that lasts for more than 12 weeks is considered chronic.  CAUSES  Causes of sinusitis include:  Allergies.  Structural abnormalities, such as displacement of the cartilage that separates your nostrils (deviated septum), which can decrease the air flow through your nose and sinuses and affect sinus drainage.  Functional abnormalities, such as when the small hairs (cilia) that line your sinuses and help remove mucus do not work properly or are not present. SYMPTOMS  Symptoms of acute and chronic sinusitis are the same. The primary symptoms are pain and pressure around the affected sinuses. Other symptoms include:  Upper toothache.  Earache.  Headache.  Bad breath.  Decreased sense of smell and taste.  A cough, which worsens when you are lying flat.  Fatigue.  Fever.  Thick drainage from your nose, which often is green and may contain pus (purulent).  Swelling and warmth over the affected sinuses. DIAGNOSIS  Your caregiver will perform a physical exam. During the exam, your caregiver may:  Look in your  nose for signs of abnormal growths in your nostrils (nasal polyps).  Tap over the affected sinus to check for signs of infection.  View the inside of your sinuses (endoscopy) with a special imaging device with a light attached (endoscope), which is inserted into your sinuses. If your caregiver suspects that you have chronic sinusitis, one or more of the following tests may be recommended:  Allergy tests.  Nasal culture A sample of mucus is taken from your nose and sent to a lab and screened for bacteria.  Nasal cytology A sample of mucus is taken from your nose and examined by your caregiver to determine if your sinusitis is related to an allergy. TREATMENT  Most cases of acute sinusitis are related to a viral infection and will resolve on their own within 10 days. Sometimes medicines are prescribed to help relieve symptoms (pain medicine, decongestants, nasal steroid sprays, or saline sprays).  However, for sinusitis related to a bacterial infection, your caregiver will prescribe antibiotic medicines. These are medicines that will help kill the bacteria causing the infection.  Rarely, sinusitis is caused by a fungal infection. In theses cases, your caregiver will prescribe antifungal medicine. For some cases of chronic sinusitis, surgery is needed. Generally, these are cases in which sinusitis recurs more than 3 times per year, despite other treatments. HOME CARE INSTRUCTIONS   Drink plenty of water. Water helps thin the mucus so your sinuses can drain more easily.  Use a humidifier.  Inhale steam 3 to 4 times a day (for example, sit in the bathroom with the shower running).  Apply a warm, moist  washcloth to your face 3 to 4 times a day, or as directed by your caregiver.  Use saline nasal sprays to help moisten and clean your sinuses.  Take over-the-counter or prescription medicines for pain, discomfort, or fever only as directed by your caregiver. SEEK IMMEDIATE MEDICAL CARE  IF:  You have increasing pain or severe headaches.  You have nausea, vomiting, or drowsiness.  You have swelling around your face.  You have vision problems.  You have a stiff neck.  You have difficulty breathing. MAKE SURE YOU:   Understand these instructions.  Will watch your condition.  Will get help right away if you are not doing well or get worse. Document Released: 11/13/2005 Document Revised: 02/05/2012 Document Reviewed: 11/28/2011 [facility] Patient Information 2014 [place], [place].

## 2018-08-13 NOTE — Progress Notes (Signed)
Patient presents to clinic today c/o several weeks of nasal congestion and sinus pressure, now with L-sided facial/sinus pain and drainage from her eye noted this morning. Notes some redness of eye and crusting. Washed off with a cloth without recurrence. Denies fever, chills, malaise. Has been using saline nasal rinses.   Past Medical History:  Diagnosis Date  . ADHD (attention deficit hyperactivity disorder)   . Depression   . Precocious puberty    Was followed by endocrinology.    Current Outpatient Medications on File Prior to Visit  Medication Sig Dispense Refill  . atomoxetine (STRATTERA) 25 MG capsule Take 1 capsule (25 mg total) by mouth daily. 30 capsule 1  . topiramate (TOPAMAX) 25 MG tablet TAKE 1 TABLET BY MOUTH TWICE A DAY 180 tablet 0   No current facility-administered medications on file prior to visit.     Allergies  Allergen Reactions  . Lupron [Leuprolide Acetate]     Was allergic to a preservative in the Lupron.    Family History  Problem Relation Age of Onset  . Hypertension Mother   . Diabetes Father   . Heart disease Father   . Cancer Neg Hx     Social History   Socioeconomic History  . Marital status: Single    Spouse name: Not on file  . Number of children: 0  . Years of education: Not on file  . Highest education level: Not on file  Occupational History  . 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 Financial resource strain: Not on file  . Food insecurity:    Worry: Not on file    Inability: Not on file  . Transportation needs:    Medical: Not on file    Non-medical: Not on file  Tobacco Use  . Smoking status: Never Smoker  . Smokeless tobacco: Never Used  Substance and Sexual Activity  . Alcohol use: No    Alcohol/week: 0.0 standard drinks  . Drug use: No  . Sexual activity: Not on file  Lifestyle  . Physical activity:    Days per week: Not on file    Minutes per session: Not on file  . Stress: Not on  file  Relationships  . Social connections:    Talks on phone: Not on file    Gets together: Not on file    Attends religious service: Not on file    Active member of club or organization: Not on file    Attends meetings of clubs or organizations: Not on file    Relationship status: Not on file  Other Topics Concern  . Not on file  Social History Narrative   Caffeine Use:  3 daily   Regular Exercise:  2-3 x weekly   Lives with Mom step dad and brother   Attends [employer].   Patient is the granddaughter of [name]         Review of Systems - See HPI.  All other ROS are negative.  BP 120/82   Pulse 76   Temp (!) 97.4 F (36.3 C) (Oral)   Resp 16   Ht 5\' 6"  (1.676 m)   Wt 268 lb (121.6 kg)   SpO2 99%   BMI 43.26 kg/m   Physical Exam  Constitutional: She appears well-developed and well-nourished.  HENT:  Head: Normocephalic and atraumatic.  Right Ear: Tympanic membrane normal.  Left Ear: Tympanic membrane normal.  Nose: Mucosal edema and rhinorrhea present. Left  sinus exhibits maxillary sinus tenderness.  Mouth/Throat: Uvula is midline, oropharynx is clear and moist and mucous membranes are normal.  Eyes: Conjunctivae are normal. Right eye exhibits no discharge. Left eye exhibits no discharge.  Neck: Neck supple.  Cardiovascular: Normal rate, regular rhythm, normal heart sounds and intact distal pulses.  Pulmonary/Chest: Effort normal and breath sounds normal. No stridor. No respiratory distress. She has no wheezes. She has no rales. She exhibits no tenderness.  Lymphadenopathy:    She has no cervical adenopathy.  Psychiatric: She has a normal mood and affect.  Vitals reviewed.  Assessment/Plan: 1. Acute non-recurrent maxillary sinusitis Rx Doxycycline.  Increase fluids.  Rest.  Saline nasal spray.  Probiotic.  Mucinex as directed.  Humidifier in bedroom. Continue allergy medications. Continue warm compresses for any eye drainage. No sign of conjunctivitis on  examination.  Call or return to clinic if symptoms are not improving.  - doxycycline (VIBRAMYCIN) 100 MG capsule; Take 1 capsule (100 mg total) by mouth 2 (two) times daily.  Dispense: 20 capsule; Refill: 0   Piedad ClimesWilliam Cody Tariyah Pendry, PA-C

## 2018-08-26 ENCOUNTER — Ambulatory Visit (INDEPENDENT_AMBULATORY_CARE_PROVIDER_SITE_OTHER): Payer: BLUE CROSS/BLUE SHIELD | Admitting: Physician Assistant

## 2018-08-26 ENCOUNTER — Encounter: Payer: Self-pay | Admitting: Physician Assistant

## 2018-08-26 ENCOUNTER — Other Ambulatory Visit: Payer: Self-pay

## 2018-08-26 ENCOUNTER — Encounter: Payer: Self-pay | Admitting: Emergency Medicine

## 2018-08-26 VITALS — BP 110/72 | HR 85 | Temp 98.4°F | Resp 14 | Ht 66.0 in | Wt 270.0 lb

## 2018-08-26 DIAGNOSIS — Z23 Encounter for immunization: Secondary | ICD-10-CM

## 2018-08-26 DIAGNOSIS — Z111 Encounter for screening for respiratory tuberculosis: Secondary | ICD-10-CM | POA: Insufficient documentation

## 2018-08-26 DIAGNOSIS — Z0000 Encounter for general adult medical examination without abnormal findings: Secondary | ICD-10-CM | POA: Diagnosis not present

## 2018-08-26 LAB — CBC WITH DIFFERENTIAL/PLATELET
BASOS PCT: 0.8 % (ref 0.0–3.0)
Basophils Absolute: 0.1 10*3/uL (ref 0.0–0.1)
EOS PCT: 2 % (ref 0.0–5.0)
Eosinophils Absolute: 0.1 10*3/uL (ref 0.0–0.7)
HEMATOCRIT: 33.7 % — AB (ref 36.0–46.0)
HEMOGLOBIN: 11 g/dL — AB (ref 12.0–15.0)
LYMPHS PCT: 32.4 % (ref 12.0–46.0)
Lymphs Abs: 2.1 10*3/uL (ref 0.7–4.0)
MCHC: 32.6 g/dL (ref 30.0–36.0)
MCV: 78.1 fl (ref 78.0–100.0)
Monocytes Absolute: 0.6 10*3/uL (ref 0.1–1.0)
Monocytes Relative: 9.8 % (ref 3.0–12.0)
NEUTROS ABS: 3.5 10*3/uL (ref 1.4–7.7)
Neutrophils Relative %: 55 % (ref 43.0–77.0)
PLATELETS: 313 10*3/uL (ref 150.0–400.0)
RBC: 4.32 Mil/uL (ref 3.87–5.11)
RDW: 16.2 % — ABNORMAL HIGH (ref 11.5–15.5)
WBC: 6.4 10*3/uL (ref 4.0–10.5)

## 2018-08-26 LAB — COMPREHENSIVE METABOLIC PANEL
ALBUMIN: 3.6 g/dL (ref 3.5–5.2)
ALT: 14 U/L (ref 0–35)
AST: 14 U/L (ref 0–37)
Alkaline Phosphatase: 72 U/L (ref 39–117)
BUN: 10 mg/dL (ref 6–23)
CALCIUM: 8.7 mg/dL (ref 8.4–10.5)
CO2: 27 meq/L (ref 19–32)
CREATININE: 0.52 mg/dL (ref 0.40–1.20)
Chloride: 107 mEq/L (ref 96–112)
GFR: 190.11 mL/min (ref >60.00)
Glucose, Bld: 105 mg/dL — ABNORMAL HIGH (ref 70–99)
POTASSIUM: 3.7 meq/L (ref 3.5–5.1)
Sodium: 140 mEq/L (ref 135–145)
Total Bilirubin: 0.3 mg/dL (ref 0.2–1.2)
Total Protein: 6.8 g/dL (ref 6.0–8.3)

## 2018-08-26 LAB — LIPID PANEL
CHOL/HDL RATIO: 5
CHOLESTEROL: 171 mg/dL (ref 0–200)
HDL: 36.9 mg/dL — AB (ref >39.00)
LDL CALC: 105 mg/dL — AB (ref 0–99)
NonHDL: 134.28
TRIGLYCERIDES: 146 mg/dL (ref 0.0–149.0)
VLDL: 29.2 mg/dL (ref 0.0–40.0)

## 2018-08-26 LAB — HEMOGLOBIN A1C: Hgb A1c MFr Bld: 6.4 % (ref 4.6–6.5)

## 2018-08-26 NOTE — Addendum Note (Signed)
Addended by: Con Memos on: 08/26/2018 02:17 PM   Modules accepted: Orders

## 2018-08-26 NOTE — Patient Instructions (Signed)
Please go to the lab for blood work.  Our office will call you with your results unless you have chosen to receive results via MyChart. If your blood work is normal we will follow-up each year for physicals and as scheduled for chronic medical problems. If anything is abnormal we will treat accordingly and get you in for a follow-up.  We will look into the referral to Dr. Carles Collet for you.  Preventive Care 18-39 Years, Female Preventive care refers to lifestyle choices and visits with your health care provider that can promote health and wellness. What does preventive care include?  A yearly physical exam. This is also called an annual well check.  Dental exams once or twice a year.  Routine eye exams. Ask your health care provider how often you should have your eyes checked.  Personal lifestyle choices, including: ? Daily care of your teeth and gums. ? Regular physical activity. ? Eating a healthy diet. ? Avoiding tobacco and drug use. ? Limiting alcohol use. ? Practicing safe sex. ? Taking vitamin and mineral supplements as recommended by your health care provider. What happens during an annual well check? The services and screenings done by your health care provider during your annual well check will depend on your age, overall health, lifestyle risk factors, and family history of disease. Counseling Your health care provider may ask you questions about your:  Alcohol use.  Tobacco use.  Drug use.  Emotional well-being.  Home and relationship well-being.  Sexual activity.  Eating habits.  Work and work Statistician.  Method of birth control.  Menstrual cycle.  Pregnancy history.  Screening You may have the following tests or measurements:  Height, weight, and BMI.  Diabetes screening. This is done by checking your blood sugar (glucose) after you have not eaten for a while (fasting).  Blood pressure.  Lipid and cholesterol levels. These may be checked every 5  years starting at age 47.  Skin check.  Hepatitis C blood test.  Hepatitis B blood test.  Sexually transmitted disease (STD) testing.  BRCA-related cancer screening. This may be done if you have a family history of breast, ovarian, tubal, or peritoneal cancers.  Pelvic exam and Pap test. This may be done every 3 years starting at age 30. Starting at age 21, this may be done every 5 years if you have a Pap test in combination with an HPV test.  Discuss your test results, treatment options, and if necessary, the need for more tests with your health care provider. Vaccines Your health care provider may recommend certain vaccines, such as:  Influenza vaccine. This is recommended every year.  Tetanus, diphtheria, and acellular pertussis (Tdap, Td) vaccine. You may need a Td booster every 10 years.  Varicella vaccine. You may need this if you have not been vaccinated.  HPV vaccine. If you are 8 or younger, you may need three doses over 6 months.  Measles, mumps, and rubella (MMR) vaccine. You may need at least one dose of MMR. You may also need a second dose.  Pneumococcal 13-valent conjugate (PCV13) vaccine. You may need this if you have certain conditions and were not previously vaccinated.  Pneumococcal polysaccharide (PPSV23) vaccine. You may need one or two doses if you smoke cigarettes or if you have certain conditions.  Meningococcal vaccine. One dose is recommended if you are age 30-21 years and a first-year college student living in a residence hall, or if you have one of several medical conditions. You may also  need additional booster doses.  Hepatitis A vaccine. You may need this if you have certain conditions or if you travel or work in places where you may be exposed to hepatitis A.  Hepatitis B vaccine. You may need this if you have certain conditions or if you travel or work in places where you may be exposed to hepatitis B.  Haemophilus influenzae type b (Hib)  vaccine. You may need this if you have certain risk factors.  Talk to your health care provider about which screenings and vaccines you need and how often you need them. This information is not intended to replace advice given to you by your health care provider. Make sure you discuss any questions you have with your health care provider. Document Released: 01/09/2002 Document Revised: 08/02/2016 Document Reviewed: 09/14/2015 Elsevier Interactive Patient Education  [employer].

## 2018-08-26 NOTE — Assessment & Plan Note (Signed)
Order for TB gold assay today.

## 2018-08-26 NOTE — Assessment & Plan Note (Signed)
Depression screen negative. Health Maintenance reviewed -- TDaP updated today. Preventive schedule discussed and handout given in AVS. Will obtain fasting labs today.  

## 2018-08-26 NOTE — Progress Notes (Signed)
Patient presents to clinic today for annual exam.  Patient is fasting for labs.  Acute Concerns: Denies acute concerns today. Is needing TB screen for work.  Health Maintenance: Immunizations -- Due for TDaP. Declines flu shot PAP -- has never been sexually active. Will defer to next CPE.   Past Medical History:  Diagnosis Date  . ADHD (attention deficit hyperactivity disorder)   . Depression   . Precocious puberty    Was followed by endocrinology.    Past Surgical History:  Procedure Laterality Date  . NO PAST SURGERIES      Current Outpatient Medications on File Prior to Visit  Medication Sig Dispense Refill  . atomoxetine (STRATTERA) 25 MG capsule Take 1 capsule (25 mg total) by mouth daily. 30 capsule 1  . topiramate (TOPAMAX) 25 MG tablet TAKE 1 TABLET BY MOUTH TWICE A DAY 180 tablet 0   No current facility-administered medications on file prior to visit.     Allergies  Allergen Reactions  . Lupron [Leuprolide Acetate]     Was allergic to a preservative in the Lupron.    Family History  Problem Relation Age of Onset  . Hypertension Mother   . Diabetes Father   . Heart disease Father   . Cancer Neg Hx     Social History   Socioeconomic History  . Marital status: Single    Spouse name: Not on file  . Number of children: 0  . Years of education: Not on file  . Highest education level: Not on file  Occupational History  . 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 Financial resource strain: Not on file  . Food insecurity:    Worry: Not on file    Inability: Not on file  . Transportation needs:    Medical: Not on file    Non-medical: Not on file  Tobacco Use  . Smoking status: Never Smoker  . Smokeless tobacco: Never Used  Substance and Sexual Activity  . Alcohol use: No    Alcohol/week: 0.0 standard drinks  . Drug use: No  . Sexual activity: Not on file  Lifestyle  . Physical activity:    Days per week: Not on file     Minutes per session: Not on file  . Stress: Not on file  Relationships  . Social connections:    Talks on phone: Not on file    Gets together: Not on file    Attends religious service: Not on file    Active member of club or organization: Not on file    Attends meetings of clubs or organizations: Not on file    Relationship status: Not on file  . Intimate partner violence:    Fear of current or ex partner: Not on file    Emotionally abused: Not on file    Physically abused: Not on file    Forced sexual activity: Not on file  Other Topics Concern  . Not on file  Social History Narrative   Caffeine Use:  3 daily   Regular Exercise:  2-3 x weekly   Lives with Mom step dad and brother   Attends [employer].   Patient is the granddaughter of [name]         Review of Systems  Constitutional: Negative for fever and weight loss.  HENT: Negative for ear discharge, ear pain, hearing loss and tinnitus.   Eyes: Negative for blurred vision, double vision, photophobia and  pain.  Respiratory: Negative for cough and shortness of breath.   Cardiovascular: Negative for chest pain and palpitations.  Gastrointestinal: Negative for abdominal pain, blood in stool, constipation, diarrhea, heartburn, melena, nausea and vomiting.  Genitourinary: Negative for dysuria, flank pain, frequency, hematuria and urgency.  Musculoskeletal: Negative for falls.  Neurological: Negative for dizziness, loss of consciousness and headaches.  Endo/Heme/Allergies: Negative for environmental allergies.  Psychiatric/Behavioral: Negative for depression, hallucinations, substance abuse and suicidal ideas. The patient is not nervous/anxious and does not have insomnia.    BP 110/72   Pulse 85   Temp 98.4 F (36.9 C) (Oral)   Resp 14   Ht 5\' 6"  (1.676 m)   Wt 270 lb (122.5 kg)   SpO2 99%   BMI 43.58 kg/m   Physical Exam  Constitutional: She is oriented to person, place, and time.  HENT:  Head: Normocephalic and  atraumatic.  Right Ear: Tympanic membrane, external ear and ear canal normal.  Left Ear: Tympanic membrane, external ear and ear canal normal.  Nose: Nose normal. No mucosal edema.  Mouth/Throat: Uvula is midline, oropharynx is clear and moist and mucous membranes are normal. No oropharyngeal exudate or posterior oropharyngeal erythema.  Eyes: Pupils are equal, round, and reactive to light. Conjunctivae are normal.  Neck: Neck supple. No thyromegaly present.  Cardiovascular: Normal rate, regular rhythm, normal heart sounds and intact distal pulses.  Pulmonary/Chest: Effort normal and breath sounds normal. No respiratory distress. She has no wheezes. She has no rales.  Abdominal: Soft. Bowel sounds are normal. She exhibits no distension and no mass. There is no tenderness. There is no rebound and no guarding.  Lymphadenopathy:    She has no cervical adenopathy.  Neurological: She is alert and oriented to person, place, and time. No cranial nerve deficit.  Skin: Skin is warm and dry. No rash noted.  Vitals reviewed.  Assessment/Plan: Visit for preventive health examination Depression screen negative. Health Maintenance reviewed -- TDaP updated today.. Preventive schedule discussed and handout given in AVS. Will obtain fasting labs today.   Screening-pulmonary TB Order for TB gold assay today. s   [name], PA-C

## 2018-08-27 ENCOUNTER — Encounter: Payer: Self-pay | Admitting: Physician Assistant

## 2018-08-27 ENCOUNTER — Telehealth: Payer: Self-pay | Admitting: Physician Assistant

## 2018-08-27 ENCOUNTER — Other Ambulatory Visit (INDEPENDENT_AMBULATORY_CARE_PROVIDER_SITE_OTHER): Payer: BLUE CROSS/BLUE SHIELD

## 2018-08-27 ENCOUNTER — Telehealth: Payer: Self-pay | Admitting: Emergency Medicine

## 2018-08-27 DIAGNOSIS — D649 Anemia, unspecified: Secondary | ICD-10-CM | POA: Diagnosis not present

## 2018-08-27 LAB — IRON: Iron: 26 ug/dL — ABNORMAL LOW (ref 42–145)

## 2018-08-27 MED ORDER — METFORMIN HCL 500 MG PO TABS: 500.0000 mg | ORAL_TABLET | Freq: Every day | ORAL | 2 refills | Status: DC

## 2018-08-27 NOTE — Telephone Encounter (Signed)
Per result note 08/27/18:  Notes recorded by Waldon Merl, PA-C on 08/27/2018 at 8:11 AM EDT A1C at 6.4 is pre-diabetic. Needs to work harder on diet and exercise. Would recommend we consider starting Metformin 500 mg daily to help with insulin resistance. If willing, ok to send in 30 with 2 refills.   Pt. Given results/ recommendations per Malva Cogan, PA.  Agrees with plan to start Metformin 500 mg daily; will send medication order to pharmacy, per above recommendations.

## 2018-08-27 NOTE — Telephone Encounter (Signed)
Copied from CRM [id number]. Topic: General - Other >> Aug 27, 2018  4:14 PM Ronney Lion A wrote: Reason for CRM: Patient called in requesting to have her physical and Tb test results sent over to this number [phone] (fax). She says she needs this done before she can start her job.

## 2018-08-28 ENCOUNTER — Other Ambulatory Visit: Payer: Self-pay | Admitting: Emergency Medicine

## 2018-08-28 DIAGNOSIS — E611 Iron deficiency: Secondary | ICD-10-CM

## 2018-08-28 LAB — QUANTIFERON-TB GOLD PLUS
NIL: 0.05 [IU]/mL
QUANTIFERON-TB GOLD PLUS: NEGATIVE
TB1-NIL: <0 IU/mL
TB2-NIL: <0 IU/mL

## 2018-08-28 NOTE — Telephone Encounter (Signed)
Patient called  To follow up on previous CRM please advise [phone]

## 2018-08-28 NOTE — Telephone Encounter (Signed)
Faxed paperwork to fax number given.

## 2018-08-28 NOTE — Telephone Encounter (Signed)
Returned call to pt. to schedule a 3 mo. recheck of the Hgb. A1C, per recommendation of Malva Cogan, [place], per result note of 08/26/18.  The pt. stated she will call back to the office to schedule the lab appt., after looking at her calendar.

## 2018-09-04 ENCOUNTER — Other Ambulatory Visit: Payer: Self-pay | Admitting: Physician Assistant

## 2018-09-04 NOTE — Telephone Encounter (Signed)
Strattera last rx on 07/05/18 #30 1RF LOV: 08/26/18  Please advise

## 2018-11-05 ENCOUNTER — Ambulatory Visit: Payer: BLUE CROSS/BLUE SHIELD | Admitting: Physician Assistant

## 2018-11-05 ENCOUNTER — Telehealth: Payer: Self-pay | Admitting: Physician Assistant

## 2018-11-05 NOTE — Telephone Encounter (Signed)
Spoke with PCP and advised no rx for symptoms before being seen. Patient has appointment tomorrow with Ramon DredgeEdward to be determine at OV.

## 2018-11-05 NOTE — Telephone Encounter (Signed)
Copied from CRM [id number]. Topic: General - Other >> Nov 05, 2018  9:25 AM Tamela OddiHarris, Dorreen Valiente J wrote: Reason for CRM: Patient called because she believes that she has a sinus infection.  Patient stated that she would like for the doctor to send in a prescription to relieves her symptoms.  Please advise and call patient back at [phone]

## 2018-11-06 ENCOUNTER — Ambulatory Visit: Payer: BLUE CROSS/BLUE SHIELD | Admitting: Medical

## 2018-11-23 ENCOUNTER — Other Ambulatory Visit: Payer: Self-pay | Admitting: Physician Assistant

## 2018-12-07 ENCOUNTER — Other Ambulatory Visit: Payer: Self-pay | Admitting: Physician Assistant

## 2018-12-10 ENCOUNTER — Other Ambulatory Visit: Payer: Self-pay | Admitting: Physician Assistant

## 2019-01-21 ENCOUNTER — Encounter: Payer: Self-pay | Admitting: Family

## 2019-01-21 ENCOUNTER — Ambulatory Visit (INDEPENDENT_AMBULATORY_CARE_PROVIDER_SITE_OTHER): Payer: BC Managed Care – PPO | Admitting: Family

## 2019-01-21 VITALS — BP 127/69 | HR 86 | Temp 98.7°F | Resp 16 | Ht 66.0 in | Wt 273.0 lb

## 2019-01-21 DIAGNOSIS — R5383 Other fatigue: Secondary | ICD-10-CM | POA: Diagnosis not present

## 2019-01-21 DIAGNOSIS — D509 Iron deficiency anemia, unspecified: Secondary | ICD-10-CM | POA: Diagnosis not present

## 2019-01-21 DIAGNOSIS — M2550 Pain in unspecified joint: Secondary | ICD-10-CM

## 2019-01-21 DIAGNOSIS — R52 Pain, unspecified: Secondary | ICD-10-CM

## 2019-01-21 LAB — CBC WITH DIFFERENTIAL/PLATELET
BASOS ABS: 0.1 10*3/uL (ref 0.0–0.1)
Basophils Relative: 0.8 % (ref 0.0–3.0)
Eosinophils Absolute: 0.1 10*3/uL (ref 0.0–0.7)
Eosinophils Relative: 1.6 % (ref 0.0–5.0)
HEMATOCRIT: 36 % (ref 36.0–46.0)
HEMOGLOBIN: 11.7 g/dL — AB (ref 12.0–15.0)
LYMPHS ABS: 2.2 10*3/uL (ref 0.7–4.0)
LYMPHS PCT: 34.8 % (ref 12.0–46.0)
MCHC: 32.4 g/dL (ref 30.0–36.0)
MCV: 78.6 fl (ref 78.0–100.0)
MONOS PCT: 9.6 % (ref 3.0–12.0)
Monocytes Absolute: 0.6 10*3/uL (ref 0.1–1.0)
NEUTROS PCT: 53.2 % (ref 43.0–77.0)
Neutro Abs: 3.4 10*3/uL (ref 1.4–7.7)
Platelets: 327 10*3/uL (ref 150.0–400.0)
RBC: 4.58 Mil/uL (ref 3.87–5.11)
RDW: 15.8 % — ABNORMAL HIGH (ref 11.5–15.5)
WBC: 6.4 10*3/uL (ref 4.0–10.5)

## 2019-01-21 LAB — POC INFLUENZA A&B (BINAX/QUICKVUE)
Influenza A, POC: NEGATIVE
Influenza B, POC: NEGATIVE

## 2019-01-21 LAB — IRON: IRON: 28 ug/dL — AB (ref 42–145)

## 2019-01-21 LAB — TSH: TSH: 1.97 u[IU]/mL (ref 0.35–4.50)

## 2019-01-21 LAB — SEDIMENTATION RATE: Sed Rate: 30 mm/hr — ABNORMAL HIGH (ref 0–20)

## 2019-01-21 NOTE — Patient Instructions (Signed)
Please complete lab work prior to leaving. Call if symptoms worsen or if not improved in 3-4 days. You may use tylenol as needed for pain/aches. You may use mucinex as needed for congestion.   Viral Respiratory Infection A viral respiratory infection is an illness that affects parts of the body that are used for breathing. These include the lungs, nose, and throat. It is caused by a germ called a virus. Some examples of this kind of infection are:  A cold.  The flu (influenza).  A respiratory syncytial virus (RSV) infection. A person who gets this illness may have the following symptoms:  A stuffy or runny nose.  Yellow or green fluid in the nose.  A cough.  Sneezing.  Tiredness (fatigue).  Achy muscles.  A sore throat.  Sweating or chills.  A fever.  A headache. Follow these instructions at home: Managing pain and congestion  Take over-the-counter and prescription medicines only as told by your doctor.  If you have a sore throat, gargle with salt water. Do this 3-4 times per day or as needed. To make a salt-water mixture, dissolve -1 tsp of salt in 1 cup of warm water. Make sure that all the salt dissolves.  Use nose drops made from salt water. This helps with stuffiness (congestion). It also helps soften the skin around your nose.  Drink enough fluid to keep your pee (urine) pale yellow. General instructions   Rest as much as possible.  Do not drink alcohol.  Do not use any products that have nicotine or tobacco, such as cigarettes and e-cigarettes. If you need help quitting, ask your doctor.  Keep all follow-up visits as told by your doctor. This is important. How is this prevented?   Get a flu shot every year. Ask your doctor when you should get your flu shot.  Do not let other people get your germs. If you are sick: ? Stay home from work or school. ? Wash your hands with soap and water often. Wash your hands after you cough or sneeze. If soap and  water are not available, use hand sanitizer.  Avoid contact with people who are sick during cold and flu season. This is in fall and winter. Get help if:  Your symptoms last for 10 days or longer.  Your symptoms get worse over time.  You have a fever.  You have very bad pain in your face or forehead.  Parts of your jaw or neck become very swollen. Get help right away if:  You feel pain or pressure in your chest.  You have shortness of breath.  You faint or feel like you will faint.  You keep throwing up (vomiting).  You feel confused. Summary  A viral respiratory infection is an illness that affects parts of the body that are used for breathing.  Examples of this illness include a cold, the flu, and respiratory syncytial virus (RSV) infection.  The infection can cause a runny nose, cough, sneezing, sore throat, and fever.  Follow what your doctor tells you about taking medicines, drinking lots of fluid, washing your hands, resting at home, and avoiding people who are sick. This information is not intended to replace advice given to you by your health care provider. Make sure you discuss any questions you have with your health care provider. Document Released: 10/26/2008 Document Revised: 12/24/2017 Document Reviewed: 12/24/2017 Elsevier Interactive Patient Education  2019 [employer].

## 2019-01-21 NOTE — Progress Notes (Signed)
Subjective:    Patient ID: Annette Molina, female    DOB: 03/04/1996, 23 y.o.   MRN: [id number]  HPI   Patient is a 23 yr old female who presents today with several symptoms. Reports that symptoms began 1 week ago with myalgias.  She reports pain is in the muscles and the bones but states she has had this every since she had mono in middle school.    She reports that she then developed cough.  Then developed diarrhea- with green stool.  Today stool was formed today. Had tactile temp one day but did not check. Reports severity of symptoms today is 5/10.  GM notes that she is chronically fatigued.  Has a bad memory.   Lab Results  Component Value Date   WBC 6.4 08/26/2018   HGB 11.0 (L) 08/26/2018   HCT 33.7 (L) 08/26/2018   MCV 78.1 08/26/2018   PLT 313.0 08/26/2018        Review of Systems    see HPI  Past Medical History:  Diagnosis Date  . ADHD (attention deficit hyperactivity disorder)   . Depression   . Precocious puberty    Was followed by endocrinology.     Social History   Socioeconomic History  . Marital status: Single    Spouse name: Not on file  . Number of children: 0  . Years of education: Not on file  . Highest education level: Not on file  Occupational History  . Occupation: Ship broker    Comment: [employer]  . Financial resource strain: Not on file  . Food insecurity:    Worry: Not on file    Inability: Not on file  . Transportation needs:    Medical: Not on file    Non-medical: Not on file  Tobacco Use  . Smoking status: Never Smoker  . Smokeless tobacco: Never Used  Substance and Sexual Activity  . Alcohol use: No    Alcohol/week: 0.0 standard drinks  . Drug use: No  . Sexual activity: Not on file  Lifestyle  . Physical activity:    Days per week: Not on file    Minutes per session: Not on file  . Stress: Not on file  Relationships  . Social connections:    Talks on phone: Not on file    Gets together: Not  on file    Attends religious service: Not on file    Active member of club or organization: Not on file    Attends meetings of clubs or organizations: Not on file    Relationship status: Not on file  . Intimate partner violence:    Fear of current or ex partner: Not on file    Emotionally abused: Not on file    Physically abused: Not on file    Forced sexual activity: Not on file  Other Topics Concern  . Not on file  Social History Narrative   Caffeine Use:  3 daily   Regular Exercise:  2-3 x weekly   Lives with Mom step dad and brother   Attends [employer].   Patient is the granddaughter of Juanetta Gosling          Past Surgical History:  Procedure Laterality Date  . NO PAST SURGERIES      Family History  Problem Relation Age of Onset  . Hypertension Mother   . Diabetes Father   . Heart disease Father   . Cancer Neg Hx  Allergies  Allergen Reactions  . Lupron [Leuprolide Acetate]     Was allergic to a preservative in the Lupron.    Current Outpatient Medications on File Prior to Visit  Medication Sig Dispense Refill  . atomoxetine (STRATTERA) 25 MG capsule TAKE ONE CAPSULE BY MOUTH EVERY DAY 90 capsule 0  . topiramate (TOPAMAX) 25 MG tablet TAKE 1 TABLET BY MOUTH TWICE A DAY 180 tablet 0  . metFORMIN (GLUCOPHAGE) 500 MG tablet TAKE 1 TABLET BY MOUTH EVERY DAY WITH BREAKFAST (Patient not taking: Reported on 01/21/2019) 90 tablet 0   No current facility-administered medications on file prior to visit.     BP 127/69 (BP Location: Left Arm, Patient Position: Sitting, Cuff Size: Large)   Pulse 86   Temp 98.7 F (37.1 C) (Oral)   Resp 16   Ht '5\' 6"'  (1.676 m)   Wt 273 lb (123.8 kg)   LMP 01/21/2019   SpO2 100%   BMI 44.06 kg/m    Objective:   Physical Exam Constitutional:      Appearance: She is well-developed.  HENT:     Right Ear: Tympanic membrane and ear canal normal.     Left Ear: Tympanic membrane and ear canal normal.  Neck:     Musculoskeletal: Neck  supple.     Thyroid: No thyromegaly.  Cardiovascular:     Rate and Rhythm: Normal rate and regular rhythm.     Heart sounds: Normal heart sounds. No murmur.  Pulmonary:     Effort: Pulmonary effort is normal. No respiratory distress.     Breath sounds: Normal breath sounds. No wheezing.  Abdominal:     Palpations: Abdomen is soft.     Tenderness: There is no abdominal tenderness.  Lymphadenopathy:     Cervical: No cervical adenopathy.  Skin:    General: Skin is warm and dry.  Neurological:     Mental Status: She is alert and oriented to person, place, and time.  Psychiatric:        Behavior: Behavior normal.        Thought Content: Thought content normal.        Judgment: Judgment normal.           Assessment & Plan:  Viral URI with cough- symptoms most consistent with a resolving viral URI. Advised pt on supportive measures (tylenol and mucinex as needed) and to call if symptoms worsen or if they are not improved in 3-4 days. Flu swab is negative.  Fatigue/iron def anemia- check CBC, iron.  Joint pain- will obtain RA, ANA, ESR for further evaluation.

## 2019-01-22 LAB — ANA: Anti Nuclear Antibody(ANA): NEGATIVE

## 2019-01-22 LAB — RHEUMATOID FACTOR: Rhuematoid fact SerPl-aCnc: <14 IU/mL (ref <14)

## 2019-01-24 ENCOUNTER — Encounter: Payer: Self-pay | Admitting: Family

## 2019-01-24 MED ORDER — IRON 325 (65 FE) MG PO TABS: 1.0000 | ORAL_TABLET | Freq: Two times a day (BID) | ORAL | 0 refills | Status: DC

## 2019-01-24 NOTE — Telephone Encounter (Signed)
Attempted to reach patient at number listed.  That number was her mother cell phone number.  Reviewed lab findings with her mother including mild iron deficiency anemia.  Advised her to have patient begin iron 325 twice daily.  Recommended that she follow-up with PCP or provider of her choice that she may be switching offices due to location in 3 months.  We discussed that her ANA and rheumatoid factor were negative.  Sed rate is mildly elevated.  This is not new for her.  Discussed if symptoms persist after treatment for her iron deficiency anemia could consider further evaluation and/or referral to rheumatology at that time.  Mother verbalizes understanding and is agreeable to plan.  She will relay to daughter.

## 2019-01-29 ENCOUNTER — Ambulatory Visit: Payer: Self-pay | Admitting: Medical

## 2019-01-29 ENCOUNTER — Telehealth: Payer: Self-pay

## 2019-01-29 NOTE — Telephone Encounter (Signed)
Ok to switch but don't make chang in epic until day she comes in. Offer appointment.

## 2019-01-29 NOTE — Telephone Encounter (Signed)
Copied from CRM [id number]. Topic: Appointment Scheduling - Transfer of Care >> Jan 29, 2019  2:06 PM Burchel, Abbi R wrote: Reason for CRM:   Pt would like to transfer care to [facility] at [facility] due to driving distance.  Please advise.

## 2019-01-29 NOTE — Telephone Encounter (Signed)
Fine with me

## 2019-01-30 ENCOUNTER — Telehealth: Payer: Self-pay | Admitting: Physician Assistant

## 2019-01-30 NOTE — Telephone Encounter (Signed)
LVM for pt to know ok to transfer to [facility]. Pt can schedule transfer appt with provider.

## 2019-01-30 NOTE — Telephone Encounter (Signed)
LVM and informed pt provider approved for pt to transfer care to [facility]. Informed pt can call and schedule a Transfer Care appt with [facility] at her convenience.

## 2019-01-30 NOTE — Telephone Encounter (Signed)
-----   Message from Esperanza Richters, PA-C sent at 01/22/2019  9:43 AM EST ----- Regarding: RE: Transfer Pt Can transfer to me but not to actually make it official in epic until she comes in for visit.  Thanks, [facility]on Dredge ----- Message ----- From: Valentina Lucks Sent: 01/21/2019   2:07 PM EST To: Waldon Merl, PA-C, Esperanza Richters, PA-C Subject: Transfer Pt                                    Pt would like to know if ok to transfer care from [facility] and would like to est with Esperanza Richters since the office at [facility] is too far for pt to visit. Please advise.

## 2019-02-04 ENCOUNTER — Ambulatory Visit: Payer: BC Managed Care – PPO | Admitting: Family Medicine

## 2019-02-04 ENCOUNTER — Encounter: Payer: Self-pay | Admitting: Family Medicine

## 2019-02-04 ENCOUNTER — Ambulatory Visit: Payer: BC Managed Care – PPO | Admitting: Physician Assistant

## 2019-02-04 VITALS — BP 128/80 | HR 94 | Temp 98.5°F | Ht 66.0 in | Wt 271.1 lb

## 2019-02-04 DIAGNOSIS — B9789 Other viral agents as the cause of diseases classified elsewhere: Secondary | ICD-10-CM | POA: Diagnosis not present

## 2019-02-04 DIAGNOSIS — J069 Acute upper respiratory infection, unspecified: Secondary | ICD-10-CM | POA: Diagnosis not present

## 2019-02-04 NOTE — Progress Notes (Signed)
Established Patient Office Visit  Subjective:  Patient ID: Annette Molina, female    DOB: [birth year]  Age: 23 y.o. MRN: [id number]  CC:  Chief Complaint  Patient presents with  . Sore Throat    x 1 week, temp of 99.3 earlier today, temp is normal now.  . Generalized Body Aches    HPI Annette Molina presents for treatment and evaluation of a 4 to 5-day history of headache with nasal congestion postnasal drip sore throat cough myalgias and some arthralgias.  Temperature has been treated up to 98 degrees from her usual 97.5.  She denies facial pressure teeth pain wheezing or an asthma history.  There is been no nausea or vomiting.  Past Medical History:  Diagnosis Date  . ADHD (attention deficit hyperactivity disorder)   . Depression   . Precocious puberty    Was followed by endocrinology.    Past Surgical History:  Procedure Laterality Date  . NO PAST SURGERIES      Family History  Problem Relation Age of Onset  . Hypertension Mother   . Diabetes Father   . Heart disease Father   . Cancer Neg Hx     Social History   Socioeconomic History  . Marital status: Single    Spouse name: Not on file  . Number of children: 0  . Years of education: Not on file  . Highest education level: Not on file  Occupational History  . 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 Financial resource strain: Not on file  . Food insecurity:    Worry: Not on file    Inability: Not on file  . Transportation needs:    Medical: Not on file    Non-medical: Not on file  Tobacco Use  . Smoking status: Never Smoker  . Smokeless tobacco: Never Used  Substance and Sexual Activity  . Alcohol use: No    Alcohol/week: 0.0 standard drinks  . Drug use: No  . Sexual activity: Not on file  Lifestyle  . Physical activity:    Days per week: Not on file    Minutes per session: Not on file  . Stress: Not on file  Relationships  . Social connections:    Talks  on phone: Not on file    Gets together: Not on file    Attends religious service: Not on file    Active member of club or organization: Not on file    Attends meetings of clubs or organizations: Not on file    Relationship status: Not on file  . Intimate partner violence:    Fear of current or ex partner: Not on file    Emotionally abused: Not on file    Physically abused: Not on file    Forced sexual activity: Not on file  Other Topics Concern  . Not on file  Social History Narrative   Caffeine Use:  3 daily   Regular Exercise:  2-3 x weekly   Lives with Mom step dad and brother   Attends [employer].   Patient is the granddaughter of [name]          Outpatient Medications Prior to Visit  Medication Sig Dispense Refill  . atomoxetine (STRATTERA) 25 MG capsule TAKE ONE CAPSULE BY MOUTH EVERY DAY 90 capsule 0  . Ferrous Sulfate (IRON) 325 (65 Fe) MG TABS Take 1 tablet (325 mg total) by mouth 2 (two) times daily. 30 each 0  .  topiramate (TOPAMAX) 25 MG tablet TAKE 1 TABLET BY MOUTH TWICE A DAY 180 tablet 0  . metFORMIN (GLUCOPHAGE) 500 MG tablet TAKE 1 TABLET BY MOUTH EVERY DAY WITH BREAKFAST (Patient not taking: Reported on 01/21/2019) 90 tablet 0   No facility-administered medications prior to visit.     Allergies  Allergen Reactions  . Lupron [Leuprolide Acetate]     Was allergic to a preservative in the Lupron.    ROS Review of Systems  Constitutional: Negative for diaphoresis, fatigue, fever and unexpected weight change.  HENT: Positive for congestion, postnasal drip, rhinorrhea and sore throat. Negative for sinus pressure and sinus pain.   Eyes: Negative for photophobia and visual disturbance.  Respiratory: Positive for cough. Negative for shortness of breath and wheezing.   Cardiovascular: Negative.   Gastrointestinal: Negative for nausea and vomiting.  Genitourinary: Negative.   Musculoskeletal: Positive for arthralgias and myalgias.  Neurological: Positive for  headaches.  Hematological: Negative.   Psychiatric/Behavioral: Negative.       Objective:    Physical Exam  Constitutional: She is oriented to person, place, and time. She appears well-developed and well-nourished. No distress.  HENT:  Head: Normocephalic and atraumatic.  Right Ear: External ear normal.  Left Ear: External ear normal.  Mouth/Throat: Oropharynx is clear and moist. No oropharyngeal exudate.  Eyes: Pupils are equal, round, and reactive to light. Conjunctivae are normal. Right eye exhibits no discharge. Left eye exhibits no discharge. No scleral icterus.  Neck: Neck supple. No JVD present. No tracheal deviation present. No thyromegaly present.  Cardiovascular: Normal rate, regular rhythm and normal heart sounds.  Pulmonary/Chest: Effort normal and breath sounds normal. No stridor.  Abdominal: Soft. Bowel sounds are normal.  Lymphadenopathy:    She has no cervical adenopathy.  Neurological: She is alert and oriented to person, place, and time.  Skin: Skin is warm and dry. She is not diaphoretic.  Psychiatric: She has a normal mood and affect. Her behavior is normal.    BP 128/80   Pulse 94   Temp 98.5 F (36.9 C) (Oral)   Ht 5\' 6"  (1.676 m)   Wt 271 lb 2 oz (123 kg)   LMP 01/21/2019   SpO2 97%   BMI 43.76 kg/m  Wt Readings from Last 3 Encounters:  02/04/19 271 lb 2 oz (123 kg)  01/21/19 273 lb (123.8 kg)  08/26/18 270 lb (122.5 kg)   BP Readings from Last 3 Encounters:  02/04/19 128/80  01/21/19 127/69  08/26/18 110/72   Guideline developer:  UpToDate (see UpToDate for funding source) Date Released: June 2014  Health Maintenance Due  Topic Date Due  . HIV Screening  12/03/2011  . PAP-Cervical Cytology Screening  12/02/2017    There are no preventive care reminders to display for this patient.  Lab Results  Component Value Date   TSH 1.97 01/21/2019   Lab Results  Component Value Date   WBC 6.4 01/21/2019   HGB 11.7 (L) 01/21/2019   HCT  36.0 01/21/2019   MCV 78.6 01/21/2019   PLT 327.0 01/21/2019   Lab Results  Component Value Date   NA 140 08/26/2018   K 3.7 08/26/2018   CO2 27 08/26/2018   GLUCOSE 105 (H) 08/26/2018   BUN 10 08/26/2018   CREATININE 0.52 08/26/2018   BILITOT 0.3 08/26/2018   ALKPHOS 72 08/26/2018   AST 14 08/26/2018   ALT 14 08/26/2018   PROT 6.8 08/26/2018   ALBUMIN 3.6 08/26/2018   CALCIUM 8.7 08/26/2018  ANIONGAP 7 09/04/2017   GFR 190.11 08/26/2018   Lab Results  Component Value Date   CHOL 171 08/26/2018   Lab Results  Component Value Date   HDL 36.90 (L) 08/26/2018   Lab Results  Component Value Date   LDLCALC 105 (H) 08/26/2018   Lab Results  Component Value Date   TRIG 146.0 08/26/2018   Lab Results  Component Value Date   CHOLHDL 5 08/26/2018   Lab Results  Component Value Date   HGBA1C 6.4 08/26/2018      Assessment & Plan:   Problem List Items Addressed This Visit      Respiratory   Viral URI with cough - Primary      No orders of the defined types were placed in this encounter.   Follow-up: Return in about 1 week (around 02/11/2019), or if symptoms worsen or fail to improve.   Patient will use over-the-counter medicines as needed.  Follow-up in a week if not improving.  Information was given her to her on viral upper respiratory tract syndromes.

## 2019-02-04 NOTE — Patient Instructions (Signed)
Viral Respiratory Infection  A viral respiratory infection is an illness that affects parts of the body that are used for breathing. These include the lungs, nose, and throat. It is caused by a germ called a virus.  Some examples of this kind of infection are:  · A cold.  · The flu (influenza).  · A respiratory syncytial virus (RSV) infection.  A person who gets this illness may have the following symptoms:  · A stuffy or runny nose.  · Yellow or green fluid in the nose.  · A cough.  · Sneezing.  · Tiredness (fatigue).  · Achy muscles.  · A sore throat.  · Sweating or chills.  · A fever.  · A headache.  Follow these instructions at home:  Managing pain and congestion  · Take over-the-counter and prescription medicines only as told by your doctor.  · If you have a sore throat, gargle with salt water. Do this 3-4 times per day or as needed. To make a salt-water mixture, dissolve ½-1 tsp of salt in 1 cup of warm water. Make sure that all the salt dissolves.  · Use nose drops made from salt water. This helps with stuffiness (congestion). It also helps soften the skin around your nose.  · Drink enough fluid to keep your pee (urine) pale yellow.  General instructions    · Rest as much as possible.  · Do not drink alcohol.  · Do not use any products that have nicotine or tobacco, such as cigarettes and e-cigarettes. If you need help quitting, ask your doctor.  · Keep all follow-up visits as told by your doctor. This is important.  How is this prevented?    · Get a flu shot every year. Ask your doctor when you should get your flu shot.  · Do not let other people get your germs. If you are sick:  ? Stay home from work or school.  ? Wash your hands with soap and water often. Wash your hands after you cough or sneeze. If soap and water are not available, use hand sanitizer.  · Avoid contact with people who are sick during cold and flu season. This is in fall and winter.  Get help if:  · Your symptoms last for 10 days or  longer.  · Your symptoms get worse over time.  · You have a fever.  · You have very bad pain in your face or forehead.  · Parts of your jaw or neck become very swollen.  Get help right away if:  · You feel pain or pressure in your chest.  · You have shortness of breath.  · You faint or feel like you will faint.  · You keep throwing up (vomiting).  · You feel confused.  Summary  · A viral respiratory infection is an illness that affects parts of the body that are used for breathing.  · Examples of this illness include a cold, the flu, and respiratory syncytial virus (RSV) infection.  · The infection can cause a runny nose, cough, sneezing, sore throat, and fever.  · Follow what your doctor tells you about taking medicines, drinking lots of fluid, washing your hands, resting at home, and avoiding people who are sick.  This information is not intended to replace advice given to you by your health care provider. Make sure you discuss any questions you have with your health care provider.  Document Released: 10/26/2008 Document Revised: 12/24/2017 Document Reviewed: 12/24/2017  Elsevier   Interactive Patient Education © 2019 Elsevier Inc.

## 2019-02-05 ENCOUNTER — Telehealth: Payer: Self-pay

## 2019-02-05 NOTE — Telephone Encounter (Signed)
Okay to extend note to 3/16?     Copied from CRM [id number]. Topic: General - Other >> Feb 05, 2019  9:35 AM Jaquita Rector A wrote: Reason for CRM: Patient called to say that she saw Dr Doreene Burke on 02/04/2019 was excused from work for 02/05/2019 asking for an extension a new note for her to return to work on 02/10/2019. Would like to pick note up when its ready. Ph# [phone]

## 2019-02-05 NOTE — Telephone Encounter (Signed)
Note printed and signed by provider. Note faxed & patient notified.

## 2019-02-05 NOTE — Telephone Encounter (Signed)
Pt called back to give job's fax number:  [phone]

## 2019-02-07 ENCOUNTER — Encounter: Payer: Self-pay | Admitting: Physician Assistant

## 2019-02-07 NOTE — Telephone Encounter (Signed)
See MyChart message.  Patient wanted to transfer to Dr. Doreene Burke which is fine with me. She just last week requested to transfer to [facility] so I have already approved her transfer to a different provider.

## 2019-02-07 NOTE — Telephone Encounter (Signed)
Please see MyChart message regarding TOC.

## 2019-02-10 ENCOUNTER — Encounter: Payer: Self-pay | Admitting: Medical

## 2019-03-22 ENCOUNTER — Other Ambulatory Visit: Payer: Self-pay

## 2019-03-22 ENCOUNTER — Emergency Department (INDEPENDENT_AMBULATORY_CARE_PROVIDER_SITE_OTHER)
Admission: EM | Admit: 2019-03-22 | Discharge: 2019-03-22 | Disposition: A | Discharge status: Home / Self Care | Payer: BC Managed Care – PPO | Source: Home / Self Care | Attending: Family Medicine | Admitting: Family Medicine

## 2019-03-22 ENCOUNTER — Emergency Department (INDEPENDENT_AMBULATORY_CARE_PROVIDER_SITE_OTHER): Payer: BC Managed Care – PPO

## 2019-03-22 ENCOUNTER — Encounter: Payer: Self-pay | Admitting: Emergency Medicine

## 2019-03-22 DIAGNOSIS — M5416 Radiculopathy, lumbar region: Secondary | ICD-10-CM

## 2019-03-22 DIAGNOSIS — M25551 Pain in right hip: Secondary | ICD-10-CM | POA: Diagnosis not present

## 2019-03-22 DIAGNOSIS — M5430 Sciatica, unspecified side: Secondary | ICD-10-CM

## 2019-03-22 MED ORDER — PREDNISONE 20 MG PO TABS: ORAL_TABLET | ORAL | 0 refills | Status: DC

## 2019-03-22 MED ORDER — KETOROLAC TROMETHAMINE 60 MG/2ML IM SOLN: 60.0000 mg | Freq: Once | INTRAMUSCULAR | Status: DC

## 2019-03-22 MED ORDER — HYDROCODONE-ACETAMINOPHEN 5-325 MG PO TABS: ORAL_TABLET | ORAL | 0 refills | Status: DC

## 2019-03-22 NOTE — ED Provider Notes (Addendum)
[facility]    CSN: [id number] Arrival date & time: 03/22/19  1131     History   Chief Complaint Chief Complaint  Patient presents with  . Hip Pain    HPI Annette Molina is a 23 y.o. female.   Patient complains of onset of intermittent stabbing pain in her right hip area about 10 days ago.  She recalls no injury or change in activities.  The pain gradually became constant and more painful, with radiation to the right posterior thigh.  She now has a sensation of tingling radiating to her right posterior calf.   She denies bowel or bladder dysfunction, and no saddle numbness.  She has had minimal improvement with Aleve and application of a heating pad.  Today while at work her right hip gave way because of the pain.  She did not fall however. Initially, Doan's pills were somewhat helpful  The history is provided by the patient.  Hip Pain  This is a new problem. Episode onset: 10 days ago. The problem occurs constantly. The problem has been gradually worsening. Pertinent negatives include no chest pain and no abdominal pain. Associated symptoms comments: Right posterior thigh and lower leg pain. The symptoms are aggravated by standing, bending and walking. Nothing relieves the symptoms. Treatments tried: heating pad and Aleve. The treatment provided no relief.    Past Medical History:  Diagnosis Date  . ADHD (attention deficit hyperactivity disorder)   . Depression   . Precocious puberty    Was followed by endocrinology.    Patient Active Problem List   Diagnosis Date Noted  . Visit for preventive health examination 08/26/2018  . Screening-pulmonary TB 08/26/2018  . Viral URI with cough 06/05/2018  . Left ankle injury, subsequent encounter 02/27/2018  . Allergic rhinitis 12/03/2017  . Obesity 02/15/2016  . Pseudotumor cerebri 12/13/2015  . Hyperpigmentation 11/17/2015  . Bipolar depression (HCC) 05/27/2015  . Dysmenorrhea in the adolescent 07/04/2011  .  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 NO PAST SURGERIES         Home Medications    Prior to Admission medications   Medication Sig Start Date End Date Taking? Authorizing Provider  atomoxetine (STRATTERA) 25 MG capsule TAKE ONE CAPSULE BY MOUTH EVERY DAY 11/25/18   Waldon MerlMartin, William C, PA-C  Ferrous Sulfate (IRON) 325 (65 Fe) MG TABS Take 1 tablet (325 mg total) by mouth 2 (two) times daily. 01/24/19   Sandford Craze'Sullivan, Melissa, NP  HYDROcodone-acetaminophen (NORCO/VICODIN) 5-325 MG tablet Take one by mouth at bedtime as needed for pain.  May repeat in 4 to 6hr prn 03/22/19   Lattie HawBeese,  A, MD  metFORMIN (GLUCOPHAGE) 500 MG tablet TAKE 1 TABLET BY MOUTH EVERY DAY WITH BREAKFAST Patient not taking: Reported on 01/21/2019 11/25/18   Waldon MerlMartin, William C, PA-C  predniSONE (DELTASONE) 20 MG tablet Take one tab by mouth twice daily for 4 days, then one daily. Take with food. 03/22/19   Lattie HawBeese,  A, MD  topiramate (TOPAMAX) 25 MG tablet TAKE 1 TABLET BY MOUTH TWICE A DAY 06/06/18   Waldon MerlMartin, William C, PA-C    Family History Family History  Problem Relation Age of Onset  . Hypertension Mother   . Diabetes Father   . Heart disease Father   . Cancer Neg Hx     Social History Social History   Tobacco Use  . Smoking status: Never Smoker  . Smokeless tobacco: Never  Used  Substance Use Topics  . Alcohol use: No    Alcohol/week: 0.0 standard drinks  . Drug use: No     Allergies   Lupron [leuprolide acetate]   Review of Systems Review of Systems  Constitutional: Negative.   Cardiovascular: Negative for chest pain.  Gastrointestinal: Negative for abdominal pain.  All other systems reviewed and are negative.    Physical Exam Triage Vital Signs ED Triage Vitals  Enc Vitals Group     BP 03/22/19 1155 103/69     Pulse Rate 03/22/19 1155 86     Resp 03/22/19 1155 16     Temp 03/22/19 1155 98.2 F  (36.8 C)     Temp Source 03/22/19 1155 Oral     SpO2 03/22/19 1155 99 %     Weight 03/22/19 1156 230 lb (104.3 kg)     Height 03/22/19 1156 5\' 6"  (1.676 m)     Head Circumference --      Peak Flow --      Pain Score 03/22/19 1156 10     Pain Loc --      Pain Edu? --      Excl. in GC? --    No data found.  Updated Vital Signs BP 103/69 (BP Location: Right Arm)   Pulse 86   Temp 98.2 F (36.8 C) (Oral)   Resp 16   Ht 5\' 6"  (1.676 m)   Wt 104.3 kg   LMP 03/21/2019 (Exact Date)   SpO2 99%   BMI 37.12 kg/m   Visual Acuity Right Eye Distance:   Left Eye Distance:   Bilateral Distance:    Right Eye Near:   Left Eye Near:    Bilateral Near:     Physical Exam Vitals signs and nursing note reviewed.  Constitutional:      General: She is not in acute distress.    Appearance: She is obese.  HENT:     Head: Normocephalic.     Right Ear: External ear normal.     Left Ear: External ear normal.     Nose: Nose normal.     Mouth/Throat:     Pharynx: Oropharynx is clear.  Eyes:     Pupils: Pupils are equal, round, and reactive to light.  Neck:     Musculoskeletal: Normal range of motion.  Cardiovascular:     Heart sounds: Normal heart sounds.  Pulmonary:     Breath sounds: Normal breath sounds.  Abdominal:     Palpations: Abdomen is soft.     Tenderness: There is no abdominal tenderness.  Musculoskeletal:     Lumbar back: She exhibits decreased range of motion, tenderness and bony tenderness. She exhibits no swelling.       Back:     Right lower leg: No edema.     Left lower leg: No edema.     Comments: Back:  Range of motion decreased.  Can heel/toe walk and squat without difficulty. Tenderness in the midline and right paraspinous muscles from L3 to Sacral area.  Straight leg raising test is positive on the right at about 45 degrees.  Sitting knee extension test is positive on the right at about 45 degrees.  Strength and sensation in the lower extremities is normal.   Patellar and achilles reflexes are normal.   Skin:    General: Skin is warm and dry.     Findings: No rash.  Neurological:     Mental Status: She is alert.  UC Treatments / Results  Labs (all labs ordered are listed, but only abnormal results are displayed) Labs Reviewed - No data to display  EKG None  Radiology Dg Lumbar Spine Complete  Result Date: 03/22/2019 CLINICAL DATA:  Sciatica EXAM: LUMBAR SPINE - COMPLETE 4+ VIEW COMPARISON:  None. FINDINGS: Anatomic alignment. No vertebral compression deformity. Disc height is maintained. Anterior osteophyte formation is noted in the lower thoracic spine and upper lumbar spine. No definite fracture. IMPRESSION: No acute bony pathology. Electronically Signed   By: Jolaine Click M.D.   On: 03/22/2019 13:54   Dg Hip Unilat W Or Wo Pelvis 2-3 Views Right  Result Date: 03/22/2019 CLINICAL DATA:  Right anterior hip pain radiating into the leg with numbness and tingling for 1.5 weeks. No known injury. EXAM: DG HIP (WITH OR WITHOUT PELVIS) 2-3V RIGHT COMPARISON:  One view abdomen 02/10/2012. Left hip radiographs 08/20/2009. FINDINGS: The mineralization and alignment are normal. There is no evidence of acute fracture or dislocation. No evidence of femoral head avascular necrosis. The hip and sacroiliac joint spaces are preserved. There are bilateral pelvic calcifications which are likely phleboliths. IMPRESSION: Normal right hip radiographs. Electronically Signed   By: Carey Bullocks M.D.   On: 03/22/2019 12:26    Procedures Procedures (including critical care time)  Medications Ordered in UC Medications  ketorolac (TORADOL) injection 60 mg (has no administration in time range)    Initial Impression / Assessment and Plan / UC Course  I have reviewed the triage vital signs and the nursing notes.  Pertinent labs & imaging results that were available during my care of the patient were reviewed by me and considered in my medical decision  making (see chart for details).    Administered Toradol  IM. ?piriformis syndrome Begin prednisone burst/taper.  Rx for Lortab (#10, no refill) for pain at night. Controlled Substance Prescriptions I have consulted the [place] Controlled Substances Registry for this patient, and feel the risk/benefit ratio today is favorable for proceeding with this prescription for a controlled substance.   Followup with Dr. Rodney Langton or Dr. Clementeen [facility] ([facility]) in about 5 days for further evaluation.   Final Clinical Impressions(s) / UC Diagnoses   Final diagnoses:  Lumbar back pain with radiculopathy affecting right lower extremity     Discharge Instructions     Apply ice pack for 20 to 30 minutes, 3 to 4 times daily  Continue until pain decreases.  May begin stretching exercises as tolerated.    ED Prescriptions    Medication Sig Dispense Auth. Provider   predniSONE (DELTASONE) 20 MG tablet Take one tab by mouth twice daily for 4 days, then one daily. Take with food. 12 tablet Lattie Haw, MD   HYDROcodone-acetaminophen (NORCO/VICODIN) 5-325 MG tablet Take one by mouth at bedtime as needed for pain.  May repeat in 4 to 6hr prn 10 tablet , Tera Mater, MD         Lattie Haw, MD 03/22/19 1421    Lattie Haw, MD 03/22/19 [phone]

## 2019-03-22 NOTE — ED Triage Notes (Signed)
Patient reports severe pain in right hip for about 10 days; no known injury; is "double jointed' and wonders if this could be causative; is in W/C because hurts to walk. Took Aleve at 0700. No travel recently.

## 2019-03-22 NOTE — Discharge Instructions (Addendum)
Apply ice pack for 20 to 30 minutes, 3 to 4 times daily  Continue until pain decreases.  May begin stretching exercises as tolerated.

## 2019-03-24 ENCOUNTER — Telehealth: Payer: Self-pay

## 2019-03-24 NOTE — Telephone Encounter (Signed)
Left message on patients VM with appointment time for Dr Denyse Amass.Marland KitchenMarland KitchenThursday, 4/30 at 150. Arrival time at 135

## 2019-03-27 ENCOUNTER — Ambulatory Visit (INDEPENDENT_AMBULATORY_CARE_PROVIDER_SITE_OTHER): Payer: BC Managed Care – PPO | Admitting: Family Medicine

## 2019-03-27 ENCOUNTER — Encounter: Payer: Self-pay | Admitting: Family Medicine

## 2019-03-27 VITALS — BP 115/72 | HR 135 | Temp 98.1°F | Wt 236.0 lb

## 2019-03-27 DIAGNOSIS — G5701 Lesion of sciatic nerve, right lower limb: Secondary | ICD-10-CM

## 2019-03-27 DIAGNOSIS — M5431 Sciatica, right side: Secondary | ICD-10-CM

## 2019-03-27 MED ORDER — PREDNISONE 5 MG (48) PO TBPK
ORAL_TABLET | ORAL | 0 refills | Status: DC
Start: 2019-03-27 — End: 2019-04-08

## 2019-03-27 MED ORDER — GABAPENTIN 300 MG PO CAPS: ORAL_CAPSULE | ORAL | 3 refills | Status: DC

## 2019-03-27 MED ORDER — HYDROCODONE-ACETAMINOPHEN 5-325 MG PO TABS: 1.0000 | ORAL_TABLET | Freq: Four times a day (QID) | ORAL | 0 refills | Status: DC | PRN

## 2019-03-27 NOTE — Progress Notes (Signed)
Subjective:    CC: Back pain  HPI: Patient notes a 2-week history of pain located in the right low back and buttocks rating down the right leg to the foot.  She denies any injury but notes her symptoms can be quite severe at times.  She notes some subjective weakness with prolonged standing.  However she denies any bowel bladder dysfunction and has not had any falls.  She was seen for this issue in urgent care on April 25.  During that visit she had x-rays of her hip and her lumbar spine which largely are unremarkable.  She was given a Toradol injection and short prednisone course as well as hydrocodone.  She was advised to follow-up with me today.  She works at the [employer] and notes that she is unable to work currently because her job requires prolonged standing which is painful as well as squatting lifting pushing and pulling.    Past medical history, Surgical history, Family history not pertinant except as noted below, Social history, Allergies, and medications have been entered into the medical record, reviewed, and no changes needed.   Review of Systems: No headache, visual changes, nausea, vomiting, diarrhea, constipation, dizziness, abdominal pain, skin rash, fevers, chills, night sweats, weight loss, swollen lymph nodes, body aches, joint swelling, muscle aches, chest pain, shortness of breath, mood changes, visual or auditory hallucinations.   Objective:    Vitals:   03/27/19 1430  BP: 115/72  Pulse: (!) 135  Temp: 98.1 F (36.7 C)   General: Well Developed, well nourished, and in no acute distress.  Neuro/Psych: Alert and oriented x3, extra-ocular muscles intact, able to move all 4 extremities, sensation grossly intact. Skin: Warm and dry, no rashes noted.  Respiratory: Not using accessory muscles, speaking in full sentences, trachea midline.  Cardiovascular: Pulses palpable, no extremity edema.  Heart rate per my check 90 bpm. Abdomen: Does not appear  distended. MSK:  L-spine: Nontender to spinal midline.  Tender palpation right lumbar paraspinal musculature. Normal lumbar motion pain is improved with flexion and worsened with extension. Positive slump test right side Lower extremity strength: Hip flexion 5/5 right and left Hip abduction 4/5 with pain right 5/5 left Hip adduction 5/5 bilaterally Knee extension 5/5 bilateral Knee flexion 5/5 bilateral Foot and great toe dorsiflexion 5/5 bilateral Foot and great toe plantarflexion 5/5 bilateral Sensation is intact and equal bilateral lower extremities Reflexes equal normal bilateral knees and Achilles tendon  Right hip: Normal-appearing normal motion. Tender palpation greater trochanter and piriformis region. Hip abduction strength diminished 4/5 right with significant pain. Hip external rotation also painful and weak.  Patient has significant antalgic gait.   Lab and Radiology Results Dg Lumbar Spine Complete  Result Date: 03/22/2019 CLINICAL DATA:  Sciatica EXAM: LUMBAR SPINE - COMPLETE 4+ VIEW COMPARISON:  None. FINDINGS: Anatomic alignment. No vertebral compression deformity. Disc height is maintained. Anterior osteophyte formation is noted in the lower thoracic spine and upper lumbar spine. No definite fracture. IMPRESSION: No acute bony pathology. Electronically Signed   By: Jolaine Click M.D.   On: 03/22/2019 13:54   Dg Hip Unilat W Or Wo Pelvis 2-3 Views Right  Result Date: 03/22/2019 CLINICAL DATA:  Right anterior hip pain radiating into the leg with numbness and tingling for 1.5 weeks. No known injury. EXAM: DG HIP (WITH OR WITHOUT PELVIS) 2-3V RIGHT COMPARISON:  One view abdomen 02/10/2012. Left hip radiographs 08/20/2009. FINDINGS: The mineralization and alignment are normal. There is no evidence of  acute fracture or dislocation. No evidence of femoral head avascular necrosis. The hip and sacroiliac joint spaces are preserved. There are bilateral pelvic calcifications which  are likely phleboliths. IMPRESSION: Normal right hip radiographs. Electronically Signed   By: Carey BullocksWilliam  Veazey M.D.   On: 03/22/2019 12:26  I personally (independently) visualized and performed the interpretation of the images attached in this note.   Impression and Recommendations:    Assessment and Plan: 23 y.o. female with  Right low back and lateral hip pain radiating down the right leg in an S1 nerve distribution.  X-ray imaging on April 25 was unremarkable.  She has had some benefit with prednisone however continues to be very symptomatic and unable to work.  She has some weakness especially to hip abduction and significant discomfort with ambulation.  Differential includes piriformis syndrome or possible S1 sacral radiculopathy due to bulging disc.  Plan for extended course of prednisone, trial of gabapentin, home exercise program and relative rest.  However most importantly I think she would benefit likely from physical therapy especially if her symptoms are due to piriformis syndrome.  Work note provided and will fill out FMLA paperwork and short-term disability paperwork if needed.  Recheck via video visit in 2 weeks.  Recheck sooner if needed.  PDMP reviewed during this encounter. Orders Placed This Encounter  Procedures  . Ambulatory referral to Physical Therapy    Referral Priority:   Routine    Referral Type:   Physical Medicine    Referral Reason:   Specialty Services Required    Requested Specialty:   Physical Therapy   Meds ordered this encounter  Medications  . gabapentin (NEURONTIN) 300 MG capsule    Sig: One tab PO qHS for a week, then BID for a week, then TID. May double weekly to a max of 3,600mg /day    Dispense:  180 capsule    Refill:  3  . predniSONE (STERAPRED UNI-PAK 48 TAB) 5 MG (48) TBPK tablet    Sig: 12 day dosepack po    Dispense:  48 tablet    Refill:  0  . HYDROcodone-acetaminophen (NORCO/VICODIN) 5-325 MG tablet    Sig: Take 1 tablet by mouth  every 6 (six) hours as needed.    Dispense:  15 tablet    Refill:  0    Discussed warning signs or symptoms. Please see discharge instructions. Patient expresses understanding.

## 2019-03-27 NOTE — Patient Instructions (Signed)
Thank you for coming in today. You should hear about physical therapy soon.  Come back or go to the emergency room if you notice new weakness new numbness problems walking or bowel or bladder problems. I think this is either sciatica or piriformis syndrome.  Use the extended prednisone course.  Start gabapentin.    Piriformis Syndrome  Piriformis syndrome is a condition that can cause pain and numbness in your buttocks and down the back of your leg. Piriformis syndrome happens when the small muscle that connects the base of your spine to your hip (piriformis muscle) presses on the nerve that runs down the back of your leg (sciatic nerve). The piriformis muscle helps your hip rotate and helps to bring your leg back and out. It also helps shift your weight while you are walking to keep you stable. The sciatic nerve runs under or through the piriformis. Damage to the piriformis muscle can cause spasms that put pressure on the nerve below. This causes pain and discomfort while sitting and moving. The pain may feel as if it begins in the buttock and spreads (radiates) down your hip and thigh. What are the causes? This condition is caused by pressure on the sciatic nerve from the piriformis muscle. The piriformis muscle can get irritated with overuse, especially if other hip muscles are weak and the piriformis has to do extra work. Piriformis syndrome can also occur after an injury, like a fall onto your buttocks. What increases the risk? This condition is more likely to develop in:  Women.  People who sit for long periods of time.  Cyclists.  People who have weak buttocks muscles (gluteal muscles). What are the signs or symptoms? Pain, tingling, or numbness that starts in the buttock and runs down the back of your leg (sciatica) is the most common symptom of this condition. Your symptoms may:  Get worse the longer you sit.  Get worse when you walk, run, or go up on stairs. How is this  diagnosed? This condition is diagnosed based on your symptoms, medical history, and physical exam. During this exam, your health care provider may move your leg into different positions to check for pain. He or she will also press on the muscles of your hip and buttock to see if that increases your symptoms. You may also have an X-ray or MRI. How is this treated? Treatment for this condition may include:  Stopping all activities that cause pain or make your condition worse.  Using heat or ice to relieve pain as told by your health care provider.  Taking medicines to reduce pain and swelling.  Taking a muscle relaxer to release the piriformis muscle.  Doing range-of-motion and strengthening exercises (physical therapy) as told by your health care provider.  Massaging the affected area.  Getting an injection of an anti-inflammatory medicine or muscle relaxer to reduce inflammation and muscle tension. In rare cases, you may need surgery to cut the muscle and release pressure on the nerve if other treatments do not work. Follow these instructions at home:  Take over-the-counter and prescription medicines only as told by your health care provider.  Do not sit for long periods. Get up and walk around every 20 minutes or as often as told by your health care provider.  If directed, apply heat to the affected area as often as told by your health care provider. Use the heat source that your health care provider recommends, such as a moist heat pack or a heating  pad. ? Place a towel between your skin and the heat source. ? Leave the heat on for 20-30 minutes. ? Remove the heat if your skin turns bright red. This is especially important if you are unable to feel pain, heat, or cold. You may have a greater risk of getting burned.  If directed, apply ice to the injured area. ? Put ice in a plastic bag. ? Place a towel between your skin and the bag. ? Leave the ice on for 20 minutes, 2-3 times a  day.  Do exercises as told by your health care provider.  Return to your normal activities as told by your health care provider. Ask your health care provider what activities are safe for you.  Keep all follow-up visits as told by your health care provider. This is important. How is this prevented?  Do not sit for longer than 20 minutes at a time. When you sit, choose padded surfaces.  Warm up and stretch before being active.  Cool down and stretch after being active.  Give your body time to rest between periods of activity.  Make sure to use equipment that fits you.  Maintain physical fitness, including: ? Strength. ? Flexibility. Contact a health care provider if:  Your pain and stiffness continue or get worse.  Your leg or hip becomes weak.  You have changes in your bowel function or bladder function. This information is not intended to replace advice given to you by your health care provider. Make sure you discuss any questions you have with your health care provider. Document Released: 11/13/2005 Document Revised: 07/18/2016 Document Reviewed: 10/26/2015 Elsevier Interactive Patient Education  2019 [employer]vier Inc[employer]    Piriformis Syndrome Rehab Ask your health care provider which exercises are safe for you. Do exercises exactly as told by your health care provider and adjust them as directed. It is normal to feel mild stretching, pulling, tightness, or discomfort as you do these exercises, but you should stop right away if you feel sudden pain or your pain gets worse.Do not begin these exercises until told by your health care provider. Stretching and range of motion exercises These exercises warm up your muscles and joints and improve the movement and flexibility of your hip and pelvis. These exercises also help to relieve pain, numbness, and tingling. Exercise A: Hip rotators  1. Lie on your back on a firm surface. 2. Pull your left / right knee toward your same shoulder  with your left / right hand until your knee is pointing toward the ceiling. Hold your left / right ankle with your other hand. 3. Keeping your knee steady, gently pull your left / right ankle toward your other shoulder until you feel a stretch in your buttocks. 4. Hold this position for __________ seconds. Repeat __________ times. Complete this stretch __________ times a day. Exercise B: Hip extensors 1. Lie on your back on a firm surface. Both of your legs should be straight. 2. Pull your left / right knee to your chest. Hold your leg in this position by holding onto the back of your thigh or the front of your knee. 3. Hold this position for __________ seconds. 4. Slowly return to the starting position. Repeat __________ times. Complete this stretch __________ times a day. Strengthening exercises These exercises build strength and endurance in your hip and thigh muscles. Endurance is the ability to use your muscles for a long time, even after they get tired. Exercise C: Straight leg raises (hip abductors)  1. Lie on your side with your left / right leg in the top position. Lie so your head, shoulder, knee, and hip line up. Bend your bottom knee to help you balance. 2. Lift your top leg up 4-6 inches (10-15 cm), keeping your toes pointed straight ahead. 3. Hold this position for __________ seconds. 4. Slowly lower your leg to the starting position. Let your muscles relax completely. Repeat __________ times. Complete this exercise__________ times a day. Exercise D: Hip abductors and rotators, quadruped  1. Get on your hands and knees on a firm, lightly padded surface. Your hands should be directly below your shoulders, and your knees should be directly below your hips. 2. Lift your left / right knee out to the side. Keep your knee bent. Do not twist your body. 3. Hold this position for __________ seconds. 4. Slowly lower your leg. Repeat __________ times. Complete this exercise__________  times a day. Exercise E: Straight leg raises (hip extensors) 1. Lie on your abdomen on a bed or a firm surface with a pillow under your hips. 2. Squeeze your buttock muscles and lift your left / right thigh off the bed. Do not let your back arch. 3. Hold this position for __________ seconds. 4. Slowly return to the starting position. Let your muscles relax completely before doing another repetition. Repeat __________ times. Complete this exercise__________ times a day. This information is not intended to replace advice given to you by your health care provider. Make sure you discuss any questions you have with your health care provider. Document Released: 11/13/2005 Document Revised: 07/18/2016 Document Reviewed: 10/26/2015 Elsevier Interactive Patient Education  2019 [employer]vier Inc.   Sciatica  Sciatica is pain, numbness, weakness, or tingling along your sciatic nerve. The sciatic nerve starts in the lower back and goes down the back of each leg. Sciatica happens when this nerve is pinched or has pressure put on it. Sciatica usually goes away on its own or with treatment. Sometimes, sciatica may keep coming back (recur). Follow these instructions at home: Medicines  Take over-the-counter and prescription medicines only as told by your doctor.  Do not drive or use heavy machinery while taking prescription pain medicine. Managing pain  If directed, put ice on the affected area. ? Put ice in a plastic bag. ? Place a towel between your skin and the bag. ? Leave the ice on for 20 minutes, 2-3 times a day.  After icing, apply heat to the affected area before you exercise or as often as told by your doctor. Use the heat source that your doctor tells you to use, such as a moist heat pack or a heating pad. ? Place a towel between your skin and the heat source. ? Leave the heat on for 20-30 minutes. ? Remove the heat if your skin turns bright red. This is especially important if you are unable  to feel pain, heat, or cold. You may have a greater risk of getting burned. Activity  Return to your normal activities as told by your doctor. Ask your doctor what activities are safe for you. ? Avoid activities that make your sciatica worse.  Take short rests during the day. Rest in a lying or standing position. This is usually better than sitting to rest. ? When you rest for a long time, do some physical activity or stretching between periods of rest. ? Avoid sitting for a long time without moving. Get up and move around at least one time each hour.  Exercise  and stretch regularly, as told by your doctor.  Do not lift anything that is heavier than 10 lb (4.5 kg) while you have symptoms of sciatica. ? Avoid lifting heavy things even when you do not have symptoms. ? Avoid lifting heavy things over and over.  When you lift objects, always lift in a way that is safe for your body. To do this, you should: ? Bend your knees. ? Keep the object close to your body. ? Avoid twisting. General instructions  Use good posture. ? Avoid leaning forward when you are sitting. ? Avoid hunching over when you are standing.  Stay at a healthy weight.  Wear comfortable shoes that support your feet. Avoid wearing high heels.  Avoid sleeping on a mattress that is too soft or too hard. You might have less pain if you sleep on a mattress that is firm enough to support your back.  Keep all follow-up visits as told by your doctor. This is important. Contact a doctor if:  You have pain that: ? Wakes you up when you are sleeping. ? Gets worse when you lie down. ? Is worse than the pain you have had in the past. ? Lasts longer than 4 weeks.  You lose weight for without trying. Get help right away if:  You cannot control when you pee (urinate) or poop (have a bowel movement).  You have weakness in any of these areas and it gets worse. ? Lower back. ? Lower belly (pelvis). ? Butt (buttocks). ?  Legs.  You have redness or swelling of your back.  You have a burning feeling when you pee. This information is not intended to replace advice given to you by your health care provider. Make sure you discuss any questions you have with your health care provider. Document Released: 08/22/2008 Document Revised: 04/20/2016 Document Reviewed: 07/23/2015 Elsevier Interactive Patient Education  2019 [employer]

## 2019-04-01 ENCOUNTER — Encounter: Payer: Self-pay | Admitting: Rehabilitative and Restorative Service Providers"

## 2019-04-01 ENCOUNTER — Ambulatory Visit (INDEPENDENT_AMBULATORY_CARE_PROVIDER_SITE_OTHER): Payer: BC Managed Care – PPO | Admitting: Rehabilitative and Restorative Service Providers"

## 2019-04-01 ENCOUNTER — Other Ambulatory Visit: Payer: Self-pay

## 2019-04-01 DIAGNOSIS — R29898 Other symptoms and signs involving the musculoskeletal system: Secondary | ICD-10-CM | POA: Diagnosis not present

## 2019-04-01 DIAGNOSIS — R2689 Other abnormalities of gait and mobility: Secondary | ICD-10-CM

## 2019-04-01 DIAGNOSIS — M5441 Lumbago with sciatica, right side: Secondary | ICD-10-CM | POA: Diagnosis not present

## 2019-04-01 DIAGNOSIS — M6281 Muscle weakness (generalized): Secondary | ICD-10-CM
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
## 2019-04-01 NOTE — Patient Instructions (Signed)
Access Code:  URL: [email or web address]  Date: 04/01/2019  Prepared by: Corlis Leak   Exercises  Supine Transversus Abdominis Bracing - Hands on Stomach - 10 reps - 1 sets - 10 sec hold - 2x daily - 7x weekly  Supine Hamstring Stretch with Strap - 10 reps - 1 sets - 30 seconds hold - 2x daily - 7x weekly  Supine Piriformis Stretch with Leg Straight - 3 reps - 1 sets - 30 seconds hold - 2x daily - 7x weekly  Supine Double Knee to Chest - 3 reps - 1 sets - 30 sec hold - 2x daily - 7x weekly  Seated Hamstring Stretch - 3 reps - 1 sets - 30 sec hold - 2x daily - 7x weekly  Patient Education  TENS Unit Myofacial ball release work  Avoid standing with knees hyperextended Avoid turning toes/knees out to the side

## 2019-04-08 ENCOUNTER — Ambulatory Visit (INDEPENDENT_AMBULATORY_CARE_PROVIDER_SITE_OTHER): Payer: BC Managed Care – PPO | Admitting: Physical Therapy

## 2019-04-08 ENCOUNTER — Ambulatory Visit (INDEPENDENT_AMBULATORY_CARE_PROVIDER_SITE_OTHER): Payer: BC Managed Care – PPO | Admitting: Family Medicine

## 2019-04-08 ENCOUNTER — Other Ambulatory Visit: Payer: Self-pay

## 2019-04-08 ENCOUNTER — Encounter: Payer: Self-pay | Admitting: Family Medicine

## 2019-04-08 ENCOUNTER — Encounter: Payer: Self-pay | Admitting: Physical Therapy

## 2019-04-08 ENCOUNTER — Ambulatory Visit: Payer: BC Managed Care – PPO | Admitting: Family Medicine

## 2019-04-08 VITALS — BP 126/89 | HR 101 | Temp 98.7°F | Wt 271.0 lb

## 2019-04-08 DIAGNOSIS — R29898 Other symptoms and signs involving the musculoskeletal system: Secondary | ICD-10-CM | POA: Diagnosis not present

## 2019-04-08 DIAGNOSIS — R2689 Other abnormalities of gait and mobility: Secondary | ICD-10-CM

## 2019-04-08 DIAGNOSIS — G5701 Lesion of sciatic nerve, right lower limb: Secondary | ICD-10-CM

## 2019-04-08 DIAGNOSIS — M5441 Lumbago with sciatica, right side: Secondary | ICD-10-CM | POA: Diagnosis not present

## 2019-04-08 DIAGNOSIS — M6281 Muscle weakness (generalized): Secondary | ICD-10-CM

## 2019-04-08 MED ORDER — HYDROCODONE-ACETAMINOPHEN 5-325 MG PO TABS: 1.0000 | ORAL_TABLET | Freq: Four times a day (QID) | ORAL | 0 refills | Status: DC | PRN

## 2019-04-08 NOTE — Progress Notes (Signed)
Annette Molina is a 23 y.o. female who presents to [facility] today for right leg and buttock pain.  Patient was seen on April 30 for right buttock pain with pain radiating down right leg.  She was thought to have piriformis syndrome versus possible S1 radiculopathy.  Plan was for prednisone gabapentin home exercise program, and physical therapy.  Plan for recheck via video visit in about 2 weeks.  In the interim she notes that she had 1 session with physical therapy on May 5. She notes that she is had improvement.  She notes less buttocks pain and much less radicular pain.  She notes her numbness is almost completely resolved.  She notes she will have some pain with prolonged activity but is much improved.  She takes gabapentin 1 pill at bedtime which does help.  She is used hydrocodone which also helps quite a bit however she is run out.  She was previously working at the [employer] and would like to go back to work with work restrictions if possible.  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   ROS:  As above  Exam:  BP 126/89   Pulse (!) 101   Temp 98.7 F (37.1 C) (Oral)   Wt 271 lb (122.9 kg)   LMP 03/21/2019 (Exact Date)   BMI 43.74 kg/m  Wt Readings from Last 5 Encounters:  04/08/19 271 lb (122.9 kg)  03/27/19 236 lb (107 kg)  03/22/19 230 lb (104.3 kg)  02/04/19 271 lb 2 oz (123 kg)  01/21/19 273 lb (123.8 kg)   General: Well Developed, well nourished, and in no acute distress.  Neuro/Psych: Alert and oriented x3, extra-ocular muscles intact, able to move all 4 extremities, sensation grossly intact. Skin: Warm and dry, no rashes noted.  Respiratory: Not using accessory muscles, speaking in full sentences, trachea midline.  Cardiovascular: Pulses palpable, no extremity edema. Abdomen: Does not appear distended. MSK:  L-spine nontender to spinal midline.  Normal motion some  pain with flexion. Lower extremity strength is intact.  Patient can stand completely on her right leg and squat. Normal gait.      Assessment and Plan: 23 y.o. female with right buttocks pain likely due to piriformis syndrome.  Improving with physical therapy.  Plan to continue physical therapy and home exercise program.  Continue gabapentin as needed.  Limited hydrocodone for pain control.  Return to work starting tomorrow with light duty work stretches for 3 weeks.  Recheck in about 1 month.  Return sooner if needed.  I spent 15 minutes with this patient, greater than 50% was face-to-face time counseling regarding treatment plan options and work restrictions.Marland Kitchen   PDMP reviewed during this encounter. No orders of the defined types were placed in this encounter.  Meds ordered this encounter  Medications  . HYDROcodone-acetaminophen (NORCO/VICODIN) 5-325 MG tablet    Sig: Take 1 tablet by mouth every 6 (six) hours as needed.    Dispense:  10 tablet    Refill:  0    Historical information moved to improve visibility of documentation.  Past Medical History:  Diagnosis Date  . ADHD (attention deficit hyperactivity disorder)   . Depression   . Precocious puberty    Was followed by endocrinology.   Past Surgical History:  Procedure Laterality Date  . NO PAST SURGERIES     Social History   Tobacco Use  . Smoking status: Never  Smoker  . Smokeless tobacco: Never Used  Substance Use Topics  . Alcohol use: No    Alcohol/week: 0.0 standard drinks   family history includes Diabetes in her father; Heart disease in her father; Hypertension in her mother.  Medications: Current Outpatient Medications  Medication Sig Dispense Refill  . gabapentin (NEURONTIN) 300 MG capsule One tab PO qHS for a week, then BID for a week, then TID. May double weekly to a max of 3,600mg /day 180 capsule 3  . topiramate (TOPAMAX) 25 MG tablet TAKE 1 TABLET BY MOUTH TWICE A DAY 180 tablet 0  .  HYDROcodone-acetaminophen (NORCO/VICODIN) 5-325 MG tablet Take 1 tablet by mouth every 6 (six) hours as needed. 10 tablet 0   No current facility-administered medications for this visit.    Allergies  Allergen Reactions  . Lupron [Leuprolide Acetate]     Was allergic to a preservative in the Lupron.      Discussed warning signs or symptoms. Please see discharge instructions. Patient expresses understanding.

## 2019-04-08 NOTE — Therapy (Signed)
[address] [place], [place], [address] Phone: [phone]   Fax:  [phone]  Physical Therapy Treatment  Patient Details  Name: Annette Molina MRN: [id number] Date of Birth: 27-May-1996 Referring Provider (PT): Dr Lynne Leader    Encounter Date: 04/08/2019  PT End of Session - 04/08/19 1017    Visit Number  2    Number of Visits  6    Date for PT Re-Evaluation  05/13/19    PT Start Time  1005    PT Stop Time  1055    PT Time Calculation (min)  50 min    Activity Tolerance  Patient tolerated treatment well    Behavior During Therapy  [facility] for tasks assessed/performed       Past Medical History:  Diagnosis Date  . ADHD (attention deficit hyperactivity disorder)   . Depression   . Precocious puberty    Was followed by endocrinology.    Past Surgical History:  Procedure Laterality Date  . NO PAST SURGERIES      There were no vitals filed for this visit.  Subjective Assessment - 04/08/19 1111    Subjective  Pt reports she has been doing her exercises daily, but only with mild changes.      Patient Stated Goals  get rid of pain and be able to walk again - wants to return to work     Currently in Pain?  Yes    Pain Score  6     Pain Location  Hip    Pain Orientation  Right    Pain Descriptors / Indicators  Aching;Nagging    Pain Radiating Towards  into back and lateral thigh    Aggravating Factors   walking; lifting    Pain Relieving Factors  medicine, ice          [facility] PT Assessment - 04/08/19 0001      Assessment   Medical Diagnosis  Rt lumbar pain; Rt piriformis syndrome    Referring Provider (PT)  Dr Lynne Leader     Onset Date/Surgical Date  03/17/19    Hand Dominance  Right    Next MD Visit  04/08/2019    Prior Therapy  PT for Lt ankle last year        [facility] Adult PT Treatment/Exercise - 04/08/19 0001      Self-Care   Self-Care  Other Self-Care Comments    Other Self-Care  Comments   reviewed self masage with ball to low back and buttocks; pt returned demo. Pt reported increased radicular symptoms with MFR work to Rt low back; symptoms decreased when work to area stopped.       Lumbar Exercises: Stretches   Passive Hamstring Stretch  Right;3 reps   supine with strap painful; repeated in sitting   Passive Hamstring Stretch Limitations  seated x 2 reps RLE     Piriformis Stretch  Right;2 reps   supine fig 4   Piriformis Stretch Limitations  trial in seated after hamstirng stretch; minimal tolerance       Lumbar Exercises: Aerobic   Nustep  L3: 2 min    stopped due to increased hip pain     Lumbar Exercises: Seated   Sit to Stand  5 reps   with core engaged.    Other Seated Lumbar Exercises  transverse ab set x 5 reps, 5 seconds.      Other Seated Lumbar Exercises  reviewed sit to/from supine via  log roll x 2 reps       Lumbar Exercises: Supine   Ab Set  10 reps;5 seconds      Knee/Hip Exercises: Stretches   Other Knee/Hip Stretches  single knee to chest x 20 sec each side, 2 reps       Modalities   Modalities  Cryotherapy;Electrical Stimulation      Cryotherapy   Number Minutes Cryotherapy  10 Minutes    Cryotherapy Location  Lumbar Spine;Hip    Type of Cryotherapy  Ice pack      Electrical Stimulation   Electrical Stimulation Location  Rt posterior hip/piriformis/gluts     Electrical Stimulation Action  IFC    Electrical Stimulation Parameters  to tolerance    Electrical Stimulation Goals  Pain;Tone      Manual Therapy   Manual Therapy  Soft tissue mobilization    Manual therapy comments  Pt in Lt sidelying     Soft tissue mobilization  gentle STM to Rt hip and QL             PT Education - 04/08/19 1056    Education Details  HEP - updated; reviewed self massage; initiated back education     Person(s) Educated  Patient    Methods  Explanation;Demonstration;Tactile cues;Verbal cues    Comprehension  Verbalized  understanding;Returned demonstration          PT Long Term Goals - 04/01/19 1626      PT LONG TERM GOAL #1   Title  Decrease pain allowing patient to return to normal functional activities and return to work with minimal to no pain(no more than 0/10 to 2/10 following activities) 05/13/2019    Time  6    Period  Weeks    Status  New      PT LONG TERM GOAL #2   Title  Patient to exhibit normal gait pattern and report return to community distances without limitations 05/13/2019    Time  6    Period  Weeks    Status  New      PT LONG TERM GOAL #3   Title  Improve core strength and Rt LE strength to WNL's 05/13/2019    Time  6    Period  Weeks    Status  New      PT LONG TERM GOAL #4   Title  Independent in HEP 05/13/2019    Time  6    Period  Weeks    Status  New      PT LONG TERM GOAL #5   Title  Improve FOTO to </=29% limitation 05/13/2019    Time  6    Period  Weeks    Status  New            Plan - 04/08/19 1107    Clinical Impression Statement  Pt continues with persistent pain in Rt buttocks and low back. She had limited tolerance for NuStep, supine hamstring stretch, and self massage with ball to QL in standing.  Pt very point tender in Rt QL, glute med and piriformis with manual therapy.  Pt reported reduction of symptoms at end of session after estim/ ice applied to area.  Pt req frequent cues on posture and body mechanics thorughout session. No new goals met; only 2nd visit.     Rehab Potential  Good    PT Frequency  1x / week    PT Duration  6 weeks    PT Treatment/Interventions  Patient/family education;ADLs/Self Care Home Management;Cryotherapy;Electrical Stimulation;Iontophoresis 73m/ml Dexamethasone;Moist Heat;Ultrasound;Dry needling;Manual techniques;Therapeutic activities;Therapeutic exercise;Neuromuscular re-education    PT Next Visit Plan  progress with core stabilization and strengthening for LE's; education in proper lifting techniques; manual work and  modalities Rt posterior hip as indicated     PT Home Exercise Plan  Access Code: [email or web address]    Consulted and Agree with Plan of Care  Patient       Patient will benefit from skilled therapeutic intervention in order to improve the following deficits and impairments:  Postural dysfunction, Improper body mechanics, Increased fascial restricitons, Increased muscle spasms, Impaired perceived functional ability, Decreased strength, Decreased mobility, Decreased range of motion, Decreased activity tolerance, Abnormal gait  Visit Diagnosis: Acute right-sided low back pain with right-sided sciatica  Other symptoms and signs involving the musculoskeletal system  Other abnormalities of gait and mobility  Muscle weakness (generalized)     Problem List Patient Active Problem List   Diagnosis Date Noted  . Visit for preventive health examination 08/26/2018  . Screening-pulmonary TB 08/26/2018  . Left ankle injury, subsequent encounter 02/27/2018  . Allergic rhinitis 12/03/2017  . Obesity 02/15/2016  . Pseudotumor cerebri 12/13/2015  . Hyperpigmentation 11/17/2015  . Bipolar depression ([place] 05/27/2015  . Dysmenorrhea in the adolescent 07/04/2011  . 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

## 2019-04-08 NOTE — Patient Instructions (Signed)

## 2019-04-08 NOTE — Patient Instructions (Signed)
Thank you for coming in today. 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  Continue physical therapy and home excercises.  Use hydrocodone sparingly for pain.  Use gabapentin for nerve pain.  Ok to increase to 3x daily as needed.  You can even increase to a max dose of 3 pills 3x daily.   Recheck in 1 month or sooner if needed.   Come back or go to the emergency room if you notice new weakness new numbness problems walking or bowel or bladder problems.

## 2019-04-14 ENCOUNTER — Ambulatory Visit (INDEPENDENT_AMBULATORY_CARE_PROVIDER_SITE_OTHER): Payer: BC Managed Care – PPO | Admitting: Physical Therapy

## 2019-04-14 ENCOUNTER — Other Ambulatory Visit: Payer: Self-pay

## 2019-04-14 DIAGNOSIS — M6281 Muscle weakness (generalized): Secondary | ICD-10-CM | POA: Diagnosis not present

## 2019-04-14 DIAGNOSIS — R2689 Other abnormalities of gait and mobility: Secondary | ICD-10-CM

## 2019-04-14 DIAGNOSIS — M5441 Lumbago with sciatica, right side: Secondary | ICD-10-CM | POA: Diagnosis not present

## 2019-04-14 DIAGNOSIS — R29898 Other symptoms and signs involving the musculoskeletal system: Secondary | ICD-10-CM

## 2019-04-14 NOTE — Patient Instructions (Signed)
Trigger Point Dry Needling  . What is Trigger Point Dry Needling (DN)? o DN is a physical therapy technique used to treat muscle pain and dysfunction. Specifically, DN helps deactivate muscle trigger points (muscle knots).  o A thin filiform needle is used to penetrate the skin and stimulate the underlying trigger point. The goal is for a local twitch response (LTR) to occur and for the trigger point to relax. No medication of any kind is injected during the procedure.   . What Does Trigger Point Dry Needling Feel Like?  o The procedure feels different for each individual patient. Some patients report that they do not actually feel the needle enter the skin and overall the process is not painful. Very mild bleeding may occur. However, many patients feel a deep cramping in the muscle in which the needle was inserted. This is the local twitch response.   Marland Kitchen How Will I feel after the treatment? o Soreness is normal, and the onset of soreness may not occur for a few hours. Typically this soreness does not last longer than two days.  o Bruising is uncommon, however; ice can be used to decrease any possible bruising.  o In rare cases feeling tired or nauseous after the treatment is normal. In addition, your symptoms may get worse before they get better, this period will typically not last longer than 24 hours.   . What Can I do After My Treatment? o Increase your hydration by drinking more water for the next 24 hours. o You may place ice or heat on the areas treated that have become sore, however, do not use heat on inflamed or bruised areas. Heat often brings more relief post needling. o You can continue your regular activities, but vigorous activity is not recommended initially after the treatment for 24 hours. o DN is best combined with other physical therapy such as strengthening, stretching, and other therapies.   Access Code:  URL: [email or web address]  Date: 04/14/2019   Prepared by: Mayer Camel   Exercises  Supine Transversus Abdominis Bracing - Hands on Stomach - 10 reps - 1 sets - 10 sec hold - 2x daily - 7x weekly  Supine Piriformis Stretch with Foot on Ground - 2 reps - 20 seconds hold - 2x daily - 7x weekly  Hooklying Single Knee to Chest - 2 reps - 20 seconds hold - 2x daily - 7x weekly  Seated Hamstring Stretch - 3 reps - 1 sets - 30 sec hold - 2x daily - 7x weekly  Seated Quadratus Lumborum Stretch with Arm Overhead - 2-3 reps - 30-90 seconds hold - 1x daily - 7x weekly  Seated Table Piriformis Stretch - 3 reps - 1 sets - 30 seconds hold - 2x daily - 7x weekly  Patient Education  TENS Unit

## 2019-04-14 NOTE — Therapy (Signed)
[facility] [place] [address] [place], [place], [address] Phone: [phone]   Fax:  [phone]  Physical Therapy Treatment  Patient Details  Name: Annette Molina MRN: [id number] Date of Birth: 1996/03/14 Referring Provider (PT): Dr Clementeen Graham    Encounter Date: 04/14/2019  PT End of Session - 04/14/19 1146    Visit Number  3    Number of Visits  6    Date for PT Re-Evaluation  05/13/19    PT Start Time  1105    PT Stop Time  1155    PT Time Calculation (min)  50 min    Activity Tolerance  Patient tolerated treatment well    Behavior During Therapy  [facility] for tasks assessed/performed       Past Medical History:  Diagnosis Date  . ADHD (attention deficit hyperactivity disorder)   . Depression   . Precocious puberty    Was followed by endocrinology.    Past Surgical History:  Procedure Laterality Date  . NO PAST SURGERIES      There were no vitals filed for this visit.  Subjective Assessment - 04/14/19 1109    Subjective  Pt reports she had relief after last session, until the next day.  She is doing exercises daily. 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  she had difficulty tolerating standing; manager let her sit during shift and this helped.     Patient Stated Goals  get rid of pain and be able to walk again - wants to return to work     Currently in Pain?  Yes    Pain Score  4     Pain Location  Buttocks    Pain Orientation  Right    Pain Descriptors / Indicators  Sharp;Tightness    Pain Radiating Towards  into Rt low back and lateral thigh.     Aggravating Factors   prolonged standing     Pain Relieving Factors  heat, medicine, sitting, stretches.          [facility] PT Assessment - 04/14/19 0001      Assessment   Medical Diagnosis  Rt lumbar pain; Rt piriformis syndrome    Referring Provider (PT)  Dr Clementeen Graham     Onset Date/Surgical Date  03/17/19    Hand Dominance  Right    Next MD Visit   05/03/19    Prior Therapy  PT for Lt ankle last year        [facility] Adult PT Treatment/Exercise - 04/14/19 0001      Lumbar Exercises: Stretches   Other Lumbar Stretch Exercise  Rt QL stretch in sidelying over bolster with arm overhead x 30 sec x 3 reps       Lumbar Exercises: Aerobic   Nustep  L4-3: 5 min       Knee/Hip Exercises: Stretches   Passive Hamstring Stretch  Right;Left;2 reps;30 seconds   seated; continued difficulty tolerating RLE.    Piriformis Stretch  Right;Left;2 reps;30 seconds    Piriformis Stretch Limitations  modified in sitting (per HEP)       Knee/Hip Exercises: Supine   Bridges  1 set;10 reps      Knee/Hip Exercises: Sidelying   Clams  RLE x 10 reps       Moist Heat Therapy   Number Minutes Moist Heat  10 Minutes    Moist Heat Location  Lumbar Spine;Hip   post Rt thigh     Electrical Stimulation  Electrical Stimulation Location  Rt post hip and hamstring     Electrical Stimulation Action  IFC    Electrical Stimulation Parameters  intensity to pt tolerance    Electrical Stimulation Goals  Pain;Tone      Manual Therapy   Manual therapy comments  Pt in Lt sidelying and prone     Soft tissue mobilization  STM to Rt glute, piriformis, QL.  TPR with passive IR/ER of RLE. in prone and Lt sidelying.              PT Education - 04/14/19 1212    Education Details  HEP, DN info    Person(s) Educated  Patient    Methods  Explanation;Handout;Verbal cues;Tactile cues;Demonstration    Comprehension  Verbalized understanding          PT Long Term Goals - 04/01/19 1626      PT LONG TERM GOAL #1   Title  Decrease pain allowing patient to return to normal functional activities and return to work with minimal to no pain(no more than 0/10 to 2/10 following activities) 05/13/2019    Time  6    Period  Weeks    Status  New      PT LONG TERM GOAL #2   Title  Patient to exhibit normal gait pattern and report return to community distances without  limitations 05/13/2019    Time  6    Period  Weeks    Status  New      PT LONG TERM GOAL #3   Title  Improve core strength and Rt LE strength to WNL's 05/13/2019    Time  6    Period  Weeks    Status  New      PT LONG TERM GOAL #4   Title  Independent in HEP 05/13/2019    Time  6    Period  Weeks    Status  New      PT LONG TERM GOAL #5   Title  Improve FOTO to </=29% limitation 05/13/2019    Time  6    Period  Weeks    Status  New            Plan - 04/14/19 1210    Clinical Impression Statement  Pt tolerated NuStep and exercises much better than last visit.  She has very limited Rt hip ER.  Pt reported reduction of pain with exercise and further reduction with use of estim at end of session. Pt may benefit from DN in future session.     Rehab Potential  Good    PT Frequency  1x / week    PT Duration  6 weeks    PT Next Visit Plan  manual therapy/ DN/ hip mobs.  continue pain and proper lifting education .    PT Home Exercise Plan  Access Code:     Consulted and Agree with Plan of Care  Patient       Patient will benefit from skilled therapeutic intervention in order to improve the following deficits and impairments:  Postural dysfunction, Improper body mechanics, Increased fascial restricitons, Increased muscle spasms, Impaired perceived functional ability, Decreased strength, Decreased mobility, Decreased range of motion, Decreased activity tolerance, Abnormal gait  Visit Diagnosis: Acute right-sided low back pain with right-sided sciatica  Other symptoms and signs involving the musculoskeletal system  Other abnormalities of gait and mobility  Muscle weakness (generalized)     Problem List Patient Active Problem List  Diagnosis Date Noted  . Visit for preventive health examination 08/26/2018  . Screening-pulmonary TB 08/26/2018  . Left ankle injury, subsequent encounter 02/27/2018  . Allergic rhinitis 12/03/2017  . Obesity 02/15/2016  .  Pseudotumor cerebri 12/13/2015  . Hyperpigmentation 11/17/2015  . Bipolar depression ([facility]) 05/27/2015  . Dysmenorrhea in the adolescent 07/04/2011  . 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

## 2019-04-16 ENCOUNTER — Telehealth: Payer: Self-pay | Admitting: Family Medicine

## 2019-04-16 NOTE — Telephone Encounter (Signed)
FMLA paperwork completed and will be sent off today.

## 2019-04-16 NOTE — Telephone Encounter (Signed)
Faxed and confirmation received. Copy placed up front for pt to pick up, she is aware. Copy sent to scan, copy given to front desk to be picked up, and copy saved at desk in case needed.   Patient states she tried to return to work yesterday but could not work a full day. States her work may be sending additional paperwork concerning disability, asks that we complete IF we receive anything. Advised pt we would keep hr updated FYI to Dr Annette Molina

## 2019-04-18 ENCOUNTER — Other Ambulatory Visit: Payer: Self-pay | Admitting: Family Medicine

## 2019-04-22 ENCOUNTER — Encounter: Payer: Self-pay | Admitting: Family

## 2019-04-22 ENCOUNTER — Encounter: Payer: BC Managed Care – PPO | Admitting: Rehabilitative and Restorative Service Providers"

## 2019-04-26 ENCOUNTER — Encounter: Payer: Self-pay | Admitting: Physician Assistant

## 2019-04-27 NOTE — Telephone Encounter (Signed)
Please call patient to schedule her for VV. She has already requested to transfer care twice (1st to Dr. Patsy Lager at [facility] and Dr. Doreene Burke at [facility]). These were both approved so please see if she is still wanting care here or if she needs an appt scheduled with new provider.

## 2019-04-28 ENCOUNTER — Telehealth: Payer: Self-pay

## 2019-04-28 NOTE — Telephone Encounter (Signed)
Left msg for pat to call back for more appt details Possible New Patient?

## 2019-04-28 NOTE — Telephone Encounter (Signed)
Called patient at both numbers but no answer.

## 2019-04-29 ENCOUNTER — Ambulatory Visit (INDEPENDENT_AMBULATORY_CARE_PROVIDER_SITE_OTHER): Payer: BC Managed Care – PPO | Admitting: Physician Assistant

## 2019-04-29 ENCOUNTER — Encounter: Payer: Self-pay | Admitting: Physician Assistant

## 2019-04-29 ENCOUNTER — Other Ambulatory Visit: Payer: Self-pay

## 2019-04-29 VITALS — Wt 267.0 lb

## 2019-04-29 DIAGNOSIS — F319 Bipolar disorder, unspecified: Secondary | ICD-10-CM | POA: Diagnosis not present

## 2019-04-29 MED ORDER — QUETIAPINE FUMARATE 50 MG PO TABS: ORAL_TABLET | ORAL | 1 refills | Status: DC

## 2019-04-29 NOTE — Telephone Encounter (Signed)
I have left a message on cell for pt to call.  Home number listed is her mother who gave me the cell phone number to call.

## 2019-04-29 NOTE — Progress Notes (Signed)
   Virtual Visit via Video   I connected with patient on 04/29/19 at  2:30 PM EDT by a video enabled telemedicine application and verified that I am speaking with the correct person using two identifiers.  Location patient: Home Location provider: [facility], Office Persons participating in the virtual visit: Patient, Provider, CMA (Patina Moore)  I discussed the limitations of evaluation and management by telemedicine and the availability of in person appointments. The patient expressed understanding and agreed to proceed.  Subjective:   HPI:   Patient presents via Doxy.Me today to discuss mood. Patient with prior diagnoses of anxiety and bipolar disorder. Not currently on any treatment as she stopped her last medication dome time ago. Notes mood is a roller coaster - Notes fluctuating depression and elevated mood with anxiety. Notes episodes of being very focused and highly energetic. Denies reckless spending or thoughts of invincibility. Notes panic attacks a few times per week, mostly related to work or school stressors. Denies suicidal thoughts or ideations.   ROS:   See pertinent positives and negatives per HPI.  Patient Active Problem List   Diagnosis Date Noted  . Visit for preventive health examination 08/26/2018  . Screening-pulmonary TB 08/26/2018  . Left ankle injury, subsequent encounter 02/27/2018  . Allergic rhinitis 12/03/2017  . Obesity 02/15/2016  . Pseudotumor cerebri 12/13/2015  . Hyperpigmentation 11/17/2015  . Bipolar depression (HCC) 05/27/2015  . Dysmenorrhea in the adolescent 07/04/2011  . 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 Smoking status: Never Smoker  . Smokeless tobacco: Never Used  Substance Use Topics  . Alcohol use: No    Alcohol/week: 0.0 standard drinks    Current Outpatient Medications:  .  QUEtiapine (SEROQUEL) 50 MG tablet, Take 0.5 tablets (25 mg total) by  mouth at bedtime for 3 days, THEN 1 tablet (50 mg total) at bedtime for 27 days., Disp: 30 tablet, Rfl: 1  Allergies  Allergen Reactions  . Lupron [Leuprolide Acetate]     Was allergic to a preservative in the Lupron.    Objective:   Wt 267 lb (121.1 kg)   BMI 43.09 kg/m   Patient is well-developed, well-nourished in no acute distress.  Resting comfortably at home.  Head is normocephalic, atraumatic.  No labored breathing.  Speech is clear and coherent with logical contest.  Patient is alert and oriented at baseline.   Assessment and Plan:   1. Bipolar depression (HCC) Discussed options. Patient is wanting to restart Seroquel as she did well on this but noted some hypersomnolence before. Feels that was also due to poor sleep schedule at that time. Would like to restart. Will work on slower titration starting with 25 mg x 3 days, then increasing to 50 mg. Follow-up via Phone in 2 weeks and will work further on titration. Counseling recommended and she is amenable to this. Information given to patient and copy of instructions sent to MyChart. Strict ER/return precautions reviewed with patient and mother.  [name], PA-C 04/29/2019

## 2019-04-29 NOTE — Progress Notes (Signed)
I have discussed the procedure for the virtual visit with the patient who has given consent to proceed with assessment and treatment.   Zubin Pontillo S Amer Alcindor, CMA     

## 2019-04-29 NOTE — Patient Instructions (Signed)
Instructions sent to MyChart.  Please start the Seroquel taking as directed each evening. Try to work on exercise as this is a great stress outlet.  Call the counselors at [phone] to schedule an appointment with a therapist. I would recommend any of them but really like Dr. Dewayne Hatch, Salomon Fick and Bambi Cottle.   Follow-up with me in 2 weeks via the phone.

## 2019-04-29 NOTE — Telephone Encounter (Signed)
Pt called back stating that she wanted to stay with [facility] as pcp. Pt has been scheduled for VV w/[facility].

## 2019-05-02 ENCOUNTER — Ambulatory Visit (INDEPENDENT_AMBULATORY_CARE_PROVIDER_SITE_OTHER): Payer: BC Managed Care – PPO | Admitting: Psychology

## 2019-05-02 DIAGNOSIS — F4321 Adjustment disorder with depressed mood: Secondary | ICD-10-CM

## 2019-05-05 ENCOUNTER — Ambulatory Visit: Payer: BC Managed Care – PPO | Admitting: Family Medicine

## 2019-05-06 ENCOUNTER — Telehealth: Payer: Self-pay | Admitting: Physician Assistant

## 2019-05-06 ENCOUNTER — Ambulatory Visit (INDEPENDENT_AMBULATORY_CARE_PROVIDER_SITE_OTHER): Payer: BC Managed Care – PPO | Admitting: Psychology

## 2019-05-06 DIAGNOSIS — F4321 Adjustment disorder with depressed mood: Secondary | ICD-10-CM

## 2019-05-06 NOTE — Telephone Encounter (Signed)
Please advise    Copied from [place] [phone]. Topic: General - Other >> May 06, 2019  4:07 PM Leward Quan A wrote: Reason for CRM: Dennison Bulla called from [place] called for Elyn Aquas asking for a call back to discuss the patient at earliest convenience. Ph# [phone]

## 2019-05-06 NOTE — Telephone Encounter (Signed)
Called Annette Molina back. No answer. LMOVM for callback at office or on my cell to discuss.

## 2019-05-09 ENCOUNTER — Encounter: Payer: Self-pay | Admitting: Family Medicine

## 2019-05-12 ENCOUNTER — Ambulatory Visit (INDEPENDENT_AMBULATORY_CARE_PROVIDER_SITE_OTHER): Payer: BC Managed Care – PPO | Admitting: Psychology

## 2019-05-12 DIAGNOSIS — F4321 Adjustment disorder with depressed mood: Secondary | ICD-10-CM | POA: Diagnosis not present

## 2019-05-19 ENCOUNTER — Ambulatory Visit (INDEPENDENT_AMBULATORY_CARE_PROVIDER_SITE_OTHER): Payer: BC Managed Care – PPO | Admitting: Psychology

## 2019-05-19 ENCOUNTER — Encounter: Payer: Self-pay | Admitting: Physician Assistant

## 2019-05-19 DIAGNOSIS — F4321 Adjustment disorder with depressed mood: Secondary | ICD-10-CM | POA: Diagnosis not present

## 2019-05-20 ENCOUNTER — Encounter: Payer: Self-pay | Admitting: Family Medicine

## 2019-05-20 ENCOUNTER — Other Ambulatory Visit: Payer: Self-pay

## 2019-05-20 ENCOUNTER — Ambulatory Visit (INDEPENDENT_AMBULATORY_CARE_PROVIDER_SITE_OTHER): Payer: BC Managed Care – PPO | Admitting: Physician Assistant

## 2019-05-20 ENCOUNTER — Ambulatory Visit (INDEPENDENT_AMBULATORY_CARE_PROVIDER_SITE_OTHER): Payer: BC Managed Care – PPO | Admitting: Family Medicine

## 2019-05-20 ENCOUNTER — Ambulatory Visit: Payer: BC Managed Care – PPO | Admitting: Physician Assistant

## 2019-05-20 ENCOUNTER — Encounter: Payer: Self-pay | Admitting: Physician Assistant

## 2019-05-20 VITALS — BP 104/60 | HR 74 | Ht 66.0 in | Wt 275.0 lb

## 2019-05-20 VITALS — Temp 96.3°F | Wt 275.0 lb

## 2019-05-20 DIAGNOSIS — M25551 Pain in right hip: Secondary | ICD-10-CM

## 2019-05-20 DIAGNOSIS — F39 Unspecified mood [affective] disorder: Secondary | ICD-10-CM | POA: Diagnosis not present

## 2019-05-20 DIAGNOSIS — F5101 Primary insomnia: Secondary | ICD-10-CM

## 2019-05-20 DIAGNOSIS — G5701 Lesion of sciatic nerve, right lower limb: Secondary | ICD-10-CM | POA: Diagnosis not present

## 2019-05-20 MED ORDER — GABAPENTIN 100 MG PO CAPS: 200.0000 mg | ORAL_CAPSULE | Freq: Every day | ORAL | 0 refills | Status: DC

## 2019-05-20 MED ORDER — MELOXICAM 15 MG PO TABS: 15.0000 mg | ORAL_TABLET | Freq: Every day | ORAL | 0 refills | Status: DC

## 2019-05-20 MED ORDER — TRAZODONE HCL 50 MG PO TABS: 25.0000 mg | ORAL_TABLET | Freq: Every evening | ORAL | 3 refills | Status: DC | PRN

## 2019-05-20 NOTE — Progress Notes (Signed)
I have discussed the procedure for the virtual visit with the patient who has given consent to proceed with assessment and treatment.   Annette Molina S Josepha Barbier, CMA     

## 2019-05-20 NOTE — Assessment & Plan Note (Signed)
Patient has symptoms with significant corresponding over the right piriformis syndrome.  Patient does have a fairly large body habitus likely contributing.  Gabapentin, meloxicam given.  Discussed formal physical therapy referral today.  X-rays of patient's hip and back have been unremarkable.  We discussed that this does not seem to make improvement we will consider the possibility of injections.  Patient will follow-up in 5 to 6 weeks.

## 2019-05-20 NOTE — Patient Instructions (Signed)
Please cut the seroquel down to 1/2 tablet daily for the next 3 days, then down to 1/2 tablet every other day for 3 more days before completely stopping.  Start the Trazodone nightly at 1/2 tablet. This will help with sleep.  I am setting you up with Psychiatry for further assessment and medication management. If you do not hear from the specialist's office within a week, please let me know.  Please call one of the numbers below to get set up with a new counselor as you are wanting to see a new practice for this.   Here are some of my recommendations: (1) [facility] -- [phone] (2) [facility] -- [phone] (3) [facility] -- [phone]

## 2019-05-20 NOTE — Progress Notes (Signed)
Annette ScaleZach Tujuana Molina D.O. [facility] [address] [place], [place] [address] Phone: [phone] Subjective:   Annette Molina, Annette Molina, am serving as a scribe for Dr. Antoine PrimasZachary Savannaha Molina.  I'm seeing this patient by the request  of:    CC: Right hip pain  QIO:[id number]:Subjective  Annette Molina is a 23 y.o. female coming in with complaint of right hip pain. Patient states that she has sciatic nerve pain. Was a lot told that she strained a muscle. Has pain constantly. Pain starts in glute and radiates down to right foot. Pain has occurred for 1 month. Pain increases with walking. Does have to lift heavy weights during her job at [employer]. Is not using any medication currently was using hydrocodone.  Patient pain syndrome does seem to help a little bit but it is never without pain.  Patient describes it as a dull, throbbing aching pain.  Patient denies any radiation significantly down area.  Denies any fevers chills abnormal weight loss.  Difficult remember any specific injury.     Past Medical History:  Diagnosis Date  . ADHD (attention deficit hyperactivity disorder)   . Depression   . Precocious puberty    Was followed by endocrinology.   Past Surgical History:  Procedure Laterality Date  . NO PAST SURGERIES     Social History   Socioeconomic History  . Marital status: Single    Spouse name: Not on file  . Number of children: 0  . Years of education: Not on file  . Highest education level: Not on file  Occupational History  . 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 Financial resource strain: Not on file  . Food insecurity    Worry: Not on file    Inability: Not on file  . Transportation needs    Medical: Not on file    Non-medical: Not on file  Tobacco Use  . Smoking status: Never Smoker  . Smokeless tobacco: Never Used  Substance and Sexual Activity  . Alcohol use: No    Alcohol/week: 0.0 standard drinks  . Drug use: No  . Sexual activity: Not  on file  Lifestyle  . Physical activity    Days per week: Not on file    Minutes per session: Not on file  . Stress: Not on file  Relationships  . Social Musicianconnections    Talks on phone: Not on file    Gets together: Not on file    Attends religious service: Not on file    Active member of club or organization: Not on file    Attends meetings of clubs or organizations: Not on file    Relationship status: Not on file  Other Topics Concern  . Not on file  Social History Narrative   Caffeine Use:  3 daily   Regular Exercise:  2-3 x weekly   Lives with Mom step dad and brother   Attends [employer].   Patient is the granddaughter of Annette BoringGloria Molina         Allergies  Allergen Reactions  . Lupron [Leuprolide Acetate]     Was allergic to a preservative in the Lupron.   Family History  Problem Relation Age of Onset  . Hypertension Mother   . Diabetes Father   . Heart disease Father   . Cancer Neg Hx        Current Outpatient Medications (Analgesics):  .  meloxicam (MOBIC) 15 MG tablet, Take 1 tablet (  15 mg total) by mouth daily.   Current Outpatient Medications (Other):  .  traZODone (DESYREL) 50 MG tablet, Take 0.5 tablets (25 mg total) by mouth at bedtime as needed for sleep. Marland Kitchen  gabapentin (NEURONTIN) 100 MG capsule, Take 2 capsules (200 mg total) by mouth at bedtime.    Past medical history, social, surgical and family history all reviewed in electronic medical record.  No pertanent information unless stated regarding to the chief complaint.   Review of Systems:  No headache, visual changes, nausea, vomiting, diarrhea, constipation, dizziness, abdominal pain, skin rash, fevers, chills, night sweats, weight loss, swollen lymph nodes, body aches, joint swelling, , chest pain, shortness of breath, mood changes.  Positive muscle aches  Objective  Blood pressure 104/60, pulse 74, height 5\' 6"  (1.676 m), weight 275 lb (124.7 kg), SpO2 99 %.    General: No apparent distress alert  and oriented x3 mood and affect normal, dressed appropriately.  Morbidly obese HEENT: Pupils equal, extraocular movements intact  Respiratory: Patient's speak in full sentences and does not appear short of breath  Cardiovascular: trace lower extremity edema, non tender, no erythema  Skin: Warm dry intact with no signs of infection or rash on extremities or on axial skeleton.  Abdomen: Soft nontender  Neuro: Cranial nerves II through XII are intact, neurovascularly intact in all extremities with 2+ DTRs and 2+ pulses.  Lymph: No lymphadenopathy of posterior or anterior cervical chain or axillae bilaterally.  Gait mild antalgic MSK:  Non tender with full range of motion and good stability and symmetric strength and tone of shoulders, elbows, wrist,  knee and ankles bilaterally.  Hip: Right ROM IR: 25 Deg, ER: 25 Deg, Flexion: 120 Deg, Extension: 100 Deg, Abduction: 45 Deg, Adduction: 25 Deg Strength IR: 5/5, ER: 5/5, Flexion: 5/5, Extension: 5/5, Abduction: 5/5, Adduction: 5/5 Pelvic alignment unremarkable to inspection and palpation. Standing hip rotation and gait without trendelenburg sign / unsteadiness. Severe pain over the piriformis on the right side. Positive Faber right No SI joint tenderness and normal minimal SI movement.  [id number]; 15 additional minutes spent for Therapeutic exercises as stated in above notes.  This included exercises focusing on stretching, strengthening, with significant focus on eccentric aspects.   Long term goals include an improvement in range of motion, strength, endurance as well as avoiding reinjury. Patient's frequency would include in 1-2 times a day, 3-5 times a week for a duration of 6-12 weeks. Using Netter's Orthopaedic Anatomy, reviewed with the patient the structures involved and how they related to diagnosis. The patient indicated understanding.   The patient was given a handout about classic piriformis stretching including [employer], Modified [employer], my self-described "Sink Stretch," and other piriformis rehab.  We also reviewed hip flexor and abductor strengthening, ham stretching  Rec deep massage, explained self-massage with ball  Proper technique shown and discussed handout in great detail with ATC.  All questions were discussed and answered.     Impression and Recommendations:     This case required medical decision making of moderate complexity. The above documentation has been reviewed and is accurate and complete Lyndal Pulley, DO       Note: This dictation was prepared with Dragon dictation along with smaller phrase technology. Any transcriptional errors that result from this process are unintentional.

## 2019-05-20 NOTE — Patient Instructions (Signed)
Good to see you.  Ice 20 minutes 2 times daily. Usually after activity and before bed. Exercises 3 times a week.  Gabapentin 200mg  at night Meloxicam 15mg  daily Tennis ball back right pocket with sitting [employer] Total Support See me again in 5-6 weeks

## 2019-05-20 NOTE — Progress Notes (Signed)
Virtual Visit via Video   I connected with patient on 05/20/19 at  9:20 AM EDT by a video enabled telemedicine application and verified that I am speaking with the correct person using two identifiers.  Location patient: Home Location provider: [facility], Office Persons participating in the virtual visit: Patient, Provider, PA-Student Anibal Henderson), CMA (Eduard Clos)  I discussed the limitations of evaluation and management by telemedicine and the availability of in person appointments. The patient expressed understanding and agreed to proceed.  Subjective:   HPI:   Patient presents via Doxy.Me today to follow-up on mood, sleep. Has some concerns about new symptoms and is wanting to see a new therapist.  At last visit, patient was restarted on Seroquel 25 mg and titrated up to 50 mg daily. Endorses taking as directed. Notes improvement in sleep but no change in mood. She actually feels medication is making her more irritable and anxious. Mother and patient also note she has had a couple of episodes of hearing a tapping/scratching sound on occasion since this which no one else can hear. Denies manic symptoms since last visit. Denies SI/HI. Has started seeing a counselor, Dennison Bulla, who she has met with 2-3 x thus far. States she feels he is not a good fit for her as he is "all over the place" and she notes significant frustration after each visit.   ROS:   See pertinent positives and negatives per HPI.  Patient Active Problem List   Diagnosis Date Noted  . Visit for preventive health examination 08/26/2018  . Screening-pulmonary TB 08/26/2018  . Left ankle injury, subsequent encounter 02/27/2018  . Allergic rhinitis 12/03/2017  . Obesity 02/15/2016  . Pseudotumor cerebri 12/13/2015  . Hyperpigmentation 11/17/2015  . Bipolar depression ([place]) 05/27/2015  . Dysmenorrhea in the adolescent 07/04/2011  . 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 Smoking status: Never Smoker  . Smokeless tobacco: Never Used  Substance Use Topics  . Alcohol use: No    Alcohol/week: 0.0 standard drinks    Current Outpatient Medications:  .  QUEtiapine (SEROQUEL) 50 MG tablet, Take 0.5 tablets (25 mg total) by mouth at bedtime for 3 days, THEN 1 tablet (50 mg total) at bedtime for 27 days. (Patient taking differently: 1 tablet daily), Disp: 30 tablet, Rfl: 1  Allergies  Allergen Reactions  . Lupron [Leuprolide Acetate]     Was allergic to a preservative in the Lupron.    Objective:   Temp (!) 96.3 F (35.7 C) (Tympanic)   Wt 275 lb (124.7 kg)   BMI 44.39 kg/m   Patient is well-developed, well-nourished in no acute distress.  Resting comfortably at home.  Head is normocephalic, atraumatic.  No labored breathing.  Speech is clear and coherent with logical contest.  Patient is alert and oriented at baseline.   Assessment and Plan:   1. Primary insomnia 2. Mood disorder ([place])   Will wean down off of Seroquel due to worsening mood and questionable auditory hallucinations. Will add nightly Trazodone 25 mg for sleep. Giving her history of issue with medication and complicated nature of her varying symptoms, want her to see Psychiatrist for more formal diagnosis. Question true bipolar disorder. Suspect ADD, mood disorder and question personality disorder. Referral placed. She has also been given numbers to other recommended counseling practices so that she can establish with a new counselor.    Leeanne Rio, [place] 05/20/2019

## 2019-05-26 ENCOUNTER — Encounter: Payer: Self-pay | Admitting: Physical Therapy

## 2019-05-26 ENCOUNTER — Ambulatory Visit: Payer: BC Managed Care – PPO | Attending: Family Medicine | Admitting: Physical Therapy

## 2019-05-26 ENCOUNTER — Other Ambulatory Visit: Payer: Self-pay

## 2019-05-26 DIAGNOSIS — R2689 Other abnormalities of gait and mobility: Secondary | ICD-10-CM | POA: Insufficient documentation

## 2019-05-26 DIAGNOSIS — R29898 Other symptoms and signs involving the musculoskeletal system: Secondary | ICD-10-CM

## 2019-05-26 DIAGNOSIS — M5441 Lumbago with sciatica, right side: Secondary | ICD-10-CM

## 2019-05-26 DIAGNOSIS — M6281 Muscle weakness (generalized): Secondary | ICD-10-CM

## 2019-05-26 DIAGNOSIS — M62838 Other muscle spasm: Secondary | ICD-10-CM | POA: Insufficient documentation

## 2019-05-26 NOTE — Patient Instructions (Addendum)
TENS UNIT  This is helpful for muscle pain and spasm.   Search and Purchase a TENS 7000 2nd edition at www.tenspros.com or www.amazon.com  (It should be less than $30)     TENS unit instructions:   Do not shower or bathe with the unit on  Turn the unit off before removing electrodes or batteries  If the electrodes lose stickiness add a drop of water to the electrodes after they are disconnected from the unit and place on plastic sheet. If you continued to have difficulty, call the TENS unit company to purchase more electrodes.  Do not apply lotion on the skin area prior to use. Make sure the skin is clean and dry as this will help prolong the life of the electrodes.  After use, always check skin for unusual red areas, rash or other skin difficulties. If there are any skin problems, does not apply electrodes to the same area.  Never remove the electrodes from the unit by pulling the wires.  Do not use the TENS unit or electrodes other than as directed.  Do not change electrode placement without consulting your therapist or physician.  Keep 2 fingers with between each electrode.   TENS stands for Transcutaneous Electrical Nerve Stimulation. In other words, electrical impulses are allowed to pass through the skin in order to excite a nerve.   Purpose and Use of TENS:  TENS is a method used to manage acute and chronic pain without the use of drugs. It has been effective in managing pain associated with surgery, sprains, strains, trauma, rheumatoid arthritis, and neuralgias. It is a non-addictive, low risk, and non-invasive technique used to control pain. It is not, by any means, a curative form of treatment.   How TENS Works:  Most TENS units are a Paramedic unit powered by one 9 volt battery. Attached to the outside of the unit are two lead wires where two pins and/or snaps connect on each wire. All units come with a set of four reusable pads or electrodes. These are placed  on the skin surrounding the area involved. By inserting the leads into  the pads, the electricity can pass from the unit making the circuit complete.  As the intensity is turned up slowly, the electrical current enters the body from the electrodes through the skin to the surrounding nerve fibers. This triggers the release of hormones from within the body. These hormones contain pain relievers. By increasing the circulation of these hormones, the person's pain may be lessened. It is also believed that the electrical stimulation itself helps to block the pain messages being sent to the brain, thus also decreasing the body's perception of pain.   Hazards:  TENS units are NOT to be used by patients with PACEMAKERS, DEFIBRILLATORS, DIABETIC PUMPS, PREGNANT WOMEN, and patients with SEIZURE DISORDERS.  TENS units are NOT to be used over the heart, throat, brain, or spinal cord.  One of the major side effects from the TENS unit may be skin irritation. Some people may develop a rash if they are sensitive to the materials used in the electrodes or the connecting wires.   Wear the unit for up to 30 minutes at a time, 3-4x/day.   Avoid overuse due the body getting used to the stem making it not as effective over time.

## 2019-05-26 NOTE — Therapy (Addendum)
[facility] [address]  [place], [place], [address] Phone: [phone]   Fax:  [phone]  Physical Therapy Evaluation / Discharge Summary  Patient Details  Name: Annette Molina MRN: [id number] Date of Birth: 05-Jun-1996 Referring Provider (PT): Hulan Saas, DO   Encounter Date: 05/26/2019  PT End of Session - 05/26/19 1555    Visit Number  1    Number of Visits  12    Date for PT Re-Evaluation  07/07/19    Authorization Type  BC[employer]    PT Start Time  5520    PT Stop Time  1700    PT Time Calculation (min)  65 min    Activity Tolerance  Patient tolerated treatment well    Behavior During Therapy  [facility] for tasks assessed/performed       Past Medical History:  Diagnosis Date  . ADHD (attention deficit hyperactivity disorder)   . Depression   . Precocious puberty    Was followed by endocrinology.    Past Surgical History:  Procedure Laterality Date  . NO PAST SURGERIES      There were no vitals filed for this visit.   Subjective Assessment - 05/26/19 1558    Subjective  Pt reporting onset of pain in R buttock w/o known MOI ~1 month ago. Tried PT at [place] location x 3 visits but did not feel that things were improving.    Pertinent History  pre-diabetic; denies any other medical or musculoskeletal problems     Limitations  Walking    How long can you stand comfortably?  1 hr    How long can you walk comfortably?  only limited with climbing stairs    Diagnostic tests  03/22/19 - hip & lumbar spine x-rays: unremarkable    Patient Stated Goals  get rid of pain and get back to normal - wants to return to work    Currently in Pain?  No/denies    Pain Score  0-No pain   up to 9/10   Pain Location  Buttocks    Pain Orientation  Right    Pain Descriptors / Indicators  --   pulling   Pain Type  Acute pain    Pain Radiating Towards  down back of R LE to foot    Pain Onset  More than a month ago     Pain Frequency  Intermittent    Aggravating Factors   going up stairs, lifting    Pain Relieving Factors  meds, laying down    Effect of Pain on Daily Activities  avoids lifitng         [facility] PT Assessment - 05/26/19 1555      Assessment   Medical Diagnosis  R piriformis syndrome    Referring Provider (PT)  Hulan Saas, DO    Onset Date/Surgical Date  03/17/19    Hand Dominance  Right    Next MD Visit  06/24/2019    Prior Therapy  PT x 3 visits in May for same problem & PT for Lt ankle last year       Precautions   Precautions  None      Restrictions   Weight Bearing Restrictions  No      Balance Screen   Has the patient fallen in the past 6 months  No    Has the patient had a decrease in activity level because of a fear of falling?   No  Is the patient reluctant to leave their home because of a fear of falling?   No      Home Environment   Living Environment  Private residence    Type of [place]  Two level;Bed/bath upstairs      Prior Function   Level of Independence  Independent    Vocation  Unemployed    Leisure  sedentary; computer/social media; games       Observation/Other Assessments   Focus on Therapeutic Outcomes (FOTO)   Lumbar - 42% (58% limitation); Predicted 71% (29% limitation)      Posture/Postural Control   Posture/Postural Control  Postural limitations    Postural Limitations  Flexed trunk;Weight shift left;Anterior pelvic tilt    Posture Comments  genu recurvatum & B hip ER      AROM   Lumbar Flexion  25% limited - pain at SIJ    Lumbar Extension  25% limited    Lumbar - Right Side Bend  WFL    Lumbar - Left Side Bend  WFL    Lumbar - Right Rotation  15% limited    Lumbar - Left Rotation  [facility]      Strength   Strength Assessment Site  Hip;Knee    Right/Left Hip  Right;Left    Right Hip Flexion  4/5    Right Hip Extension  4/5    Right Hip External Rotation   4-/5    Right Hip Internal Rotation  4/5    Right Hip  ABduction  4-/5    Right Hip ADduction  4-/5    Left Hip Flexion  4+/5    Left Hip Extension  4/5    Left Hip External Rotation  4+/5    Left Hip Internal Rotation  4+/5    Left Hip ABduction  4/5    Left Hip ADduction  4/5    Right/Left Knee  Right;Left    Right Knee Flexion  4-/5    Right Knee Extension  4/5    Left Knee Flexion  4+/5    Left Knee Extension  4+/5      Flexibility   Hamstrings  mod tight on R - painful    Quadriceps  WFL    ITB  mild tight R>L     Piriformis  severely tight on R, mild tight on L      Palpation   Spinal mobility  WFL     SI assessment   alignment appears neutral    Palpation comment  muscular tightness Rt lateral paraspinals; posterior Rt hip in piriformis and gluts to posterior greater trochanter; Rt hamstrings                 Objective measurements completed on examination: See above findings.      [facility] PT Treatment/Exercise - 05/26/19 1555      Lumbar Exercises: Stretches   Passive Hamstring Stretch  Right;30 seconds;1 rep    Passive Hamstring Stretch Limitations  supine with strap    ITB Stretch  Right;30 seconds;1 rep    ITB Stretch Limitations  supine with strap    Piriformis Stretch  Right;30 seconds;1 rep    Piriformis Stretch Limitations  KTOS    Figure 4 Stretch  30 seconds;1 rep;Supine;With overpressure    Figure 4 Stretch Limitations  R LE - figure 4 to chest with towel assist      Lumbar Exercises: Supine   Pelvic Tilt  10  reps;5 seconds    Clam  10 reps;5 seconds    Clam Limitations  alt hip ABD/ER with red TB    Bridge  10 reps;5 seconds      Knee/Hip Exercises: Sidelying   Clams  R clam with red TB x 10      Moist Heat Therapy   Number Minutes Moist Heat  10 Minutes    Moist Heat Location  Hip   R buttock     Electrical Stimulation   Electrical Stimulation Location  R buttock & posterior thigh    Electrical Stimulation Action  IFC    Electrical Stimulation Parameters  80-150 Hz, intensity to  pt tolerance x 10'    Electrical Stimulation Goals  Pain;Tone             PT Education - 05/26/19 1659    Education Details  PT eval findings, anticipated POC, HEP review (initial HEP provided from [place] [facility] in May) & update    Person(s) Educated  Patient    Methods  Explanation;Demonstration;Handout    Comprehension  Verbalized understanding;Returned demonstration;Need further instruction       PT Short Term Goals - 05/26/19 1720      PT SHORT TERM GOAL #1   Title  Indepenent with initial HEP    Status  New    Target Date  06/09/19        PT Long Term Goals - 05/26/19 1722      PT LONG TERM GOAL #1   Title  Independent with ongoing/advanced HEP    Status  New    Target Date  07/07/19      PT LONG TERM GOAL #2   Title  Improve core strength to WNL and Rt LE strength to >/= 4+/5    Status  New    Target Date  07/07/19      PT LONG TERM GOAL #3   Title  Patient to demonstrate appropriate posture and body mechanics needed for daily activities including proper lifting techniques to minimize/avoid pain    Status  New    Target Date  07/07/19      PT LONG TERM GOAL #4   Title  Patient to report ability to perform ADLs, household and work-related tasks without increased pain > 2-3/10    Status  New    Target Date  07/07/19             Plan - 05/26/19 1717    Clinical Impression Statement  [name] is a 23 y/o female who presents to OP PT with 2-3 month history of R posterior hip and LE pain in lateral thigh to foot w/o known MOI. She initiated PT in May x 3 visits but failed to complete POC due to perceived lack of progress. She reports she continues to perform HEP but upon review, she required significant correction/clarification. Lumbar ROM mildly limited with pain reported at SIJ with flexion although SIJ alignment appears WNL. Flexibility at hips limited R > L, most pronounced in piriformis and hamstrings with R hip ER significantly limited. Pain and  muscular tightness identified with palpation of R glutes and proximal HS. Core and B proximal LE weakness evident, R > L.  Pain and weakness contribute to poor posture and alignment, decreased activity tolerance and prevent her from working. [name] will benefit from PT to address above problems to allow for return to normal work and functional activities.    Personal Factors and Comorbidities  Time since onset  of injury/illness/exacerbation;Past/Current Experience;Comorbidity 3+;Fitness    Comorbidities  ADHD, bipolar, depression, obesity, h/o L ankle injury    Examination-Activity Limitations  Stairs;Stand;Locomotion Level    Examination-Participation Restrictions  Other   unemployed secondary unable to work d/t current pain   Stability/Clinical Decision Making  Stable/Uncomplicated    Clinical Decision Making  Low    Rehab Potential  Good    PT Frequency  2x / week    PT Duration  6 weeks    PT Treatment/Interventions  Patient/family education;ADLs/Self Care Home Management;Cryotherapy;Electrical Stimulation;Iontophoresis 1m/ml Dexamethasone;Moist Heat;Ultrasound;Therapeutic activities;Therapeutic exercise;Neuromuscular re-education;Functional mobility training;Manual techniques;Passive range of motion;Dry needling;Taping;Joint Manipulations;Spinal Manipulations    PT Next Visit Plan  Review of HEP and posture/body mechanics education with focus on proper lifting technique; proximal LE flexibility; core & proximal LE strengthening; manual therapy inlcuding DN as indicated; modalities PRN    Consulted and Agree with Plan of Care  Patient       Patient will benefit from skilled therapeutic intervention in order to improve the following deficits and impairments:  Postural dysfunction, Improper body mechanics, Increased fascial restricitons, Increased muscle spasms, Impaired perceived functional ability, Decreased strength, Decreased mobility, Decreased range of motion, Impaired flexibility,  Difficulty walking, Abnormal gait, Decreased activity tolerance  Visit Diagnosis: 1. Acute right-sided low back pain with right-sided sciatica   2. Other symptoms and signs involving the musculoskeletal system   3. Muscle weakness (generalized)   4. Other muscle spasm   5. Other abnormalities of gait and mobility        Problem List Patient Active Problem List   Diagnosis Date Noted  . 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 Mood disorder ([place] 05/20/2019  . Piriformis syndrome of right side 05/20/2019  . Visit for preventive health examination 08/26/2018  . Screening-pulmonary TB 08/26/2018  . Left ankle injury, subsequent encounter 02/27/2018  . Allergic rhinitis 12/03/2017  . Obesity 02/15/2016  . Pseudotumor cerebri 12/13/2015  . Hyperpigmentation 11/17/2015  . Bipolar depression ([place] 05/27/2015  . Dysmenorrhea in the adolescent 07/04/2011  . Attention deficit disorder (ADD) without hyperactivity 06/06/2011    JPercival Spanish PT, MPT 05/26/2019, 5:53 PM  [facility] [place] [address]Phone: [phone]  Fax:  [phone] Name: JEveleigh CrumplerMRN: [id number] of Birth: 115-Sep-1997  PHYSICAL THERAPY DISCHARGE SUMMARY  Visits from Start of Care: 1  Current functional level related to goals / functional outcomes:   Refer to above clinical impression for status as of eval on 05/26/19. Patient has not returned to PT in >30 days, therefore will proceed with discharge from PT for this episode.   Remaining deficits:   As above.   Education / Equipment:   HEP  Plan: Patient agrees to discharge.  Patient goals were not met. Patient is being discharged due to not returning since the last visit.  ?????     JPercival Spanish PT, MPT 06/26/19, 1:53 PM  [facility] [place] [address]Phone:  [phone]  Fax:  [phone]

## 2019-05-27 ENCOUNTER — Ambulatory Visit: Payer: BC Managed Care – PPO | Admitting: Psychology

## 2019-05-28 ENCOUNTER — Ambulatory Visit: Payer: BC Managed Care – PPO

## 2019-06-02 ENCOUNTER — Ambulatory Visit: Payer: BC Managed Care – PPO

## 2019-06-03 ENCOUNTER — Telehealth: Payer: Self-pay | Admitting: Physician Assistant

## 2019-06-03 NOTE — Telephone Encounter (Signed)
LMOVM advising patient to call back for recommendation. Did recommend stopping the Trazodone medication due to side effects. Will hold off on starting other medications until referral to Psychiatry

## 2019-06-03 NOTE — Telephone Encounter (Signed)
Patient is calling her previous psychiatrist to see if she is able to get in with them. If she needs a referral faxed over she will call us back.

## 2019-06-03 NOTE — Telephone Encounter (Signed)
Thank you for making me aware. A referral was placed at last visit to Psychiatry so this needs to be looked into.

## 2019-06-03 NOTE — Telephone Encounter (Signed)
Please contact patient. Schedule request noted new medication makes her sick (Trazodone). Have her stop this medication. Will defer further changes in medication regimen for mood/sleep to her psychiatrist as she seems to have issue with most medications that have been given.

## 2019-06-03 NOTE — Telephone Encounter (Signed)
Spoke with patient.  She is aware that she needs to see psychiatrist for medication regimen.  She states that she has not been able to get in with anyone because everyone says they require a referral.  I asked patient if she had contacted Einar Pheasant back about that and she said "no, I haven't been able to get through on the phone for days."   Pt also asked "is it possible to find a psychiatrist that has to automatically accept a patient and won't review their history first?"   Advised patient that they have the ability to review notes/records. She said her Old psychiatrist didn't do that and she wanted to go back to him. She is going to call and see if they will accept her back.   Routing to PCP as Juluis Rainier

## 2019-06-05 ENCOUNTER — Ambulatory Visit: Payer: BC Managed Care – PPO | Attending: Family Medicine | Admitting: Physical Therapy

## 2019-06-05 ENCOUNTER — Ambulatory Visit: Payer: BC Managed Care – PPO | Admitting: Physical Therapy

## 2019-06-09 ENCOUNTER — Encounter: Payer: BC Managed Care – PPO | Admitting: Physical Therapy

## 2019-06-12 ENCOUNTER — Ambulatory Visit (INDEPENDENT_AMBULATORY_CARE_PROVIDER_SITE_OTHER): Payer: BC Managed Care – PPO | Admitting: Physician Assistant

## 2019-06-12 ENCOUNTER — Other Ambulatory Visit: Payer: Self-pay

## 2019-06-12 ENCOUNTER — Other Ambulatory Visit (HOSPITAL_COMMUNITY)
Admission: RE | Admit: 2019-06-12 | Discharge: 2019-06-12 | Disposition: A | Discharge status: Home / Self Care | Payer: BC Managed Care – PPO | Source: Ambulatory Visit | Attending: Physician Assistant | Admitting: Physician Assistant

## 2019-06-12 ENCOUNTER — Ambulatory Visit: Payer: BC Managed Care – PPO

## 2019-06-12 ENCOUNTER — Encounter: Payer: BC Managed Care – PPO | Admitting: Physical Therapy

## 2019-06-12 ENCOUNTER — Encounter: Payer: Self-pay | Admitting: Physician Assistant

## 2019-06-12 DIAGNOSIS — N898 Other specified noninflammatory disorders of vagina: Secondary | ICD-10-CM

## 2019-06-12 NOTE — Progress Notes (Signed)
I have discussed the procedure for the virtual visit with the patient who has given consent to proceed with assessment and treatment.   Alfio Loescher S Samiah Ricklefs, CMA     

## 2019-06-12 NOTE — Progress Notes (Signed)
   Virtual Visit via Video   I connected with patient on 06/12/19 at  9:30 AM EDT by a video enabled telemedicine application and verified that I am speaking with the correct person using two identifiers.  Location patient: Home Location provider: [facility], Office Persons participating in the virtual visit: Patient, Provider, [place] (Patina Moore)  I discussed the limitations of evaluation and management by telemedicine and the availability of in person appointments. The patient expressed understanding and agreed to proceed.  Subjective:   HPI:   Patient presents via Doxy.Me today for "personal concerns". Patient endorses some increase in clear vaginal discharge, more so that her usual. Denies pelvic pain, vaginal itching or lesion. Denies change in urination or bowel movement. Denies fever, chills, malaise. Denies any sexual activity. LMP a little over 28 days ago. Notes she is 2 days late.   ROS:   See pertinent positives and negatives per HPI.  Patient Active Problem List   Diagnosis Date Noted  . 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 Mood disorder ([place]) 05/20/2019  . Piriformis syndrome of right side 05/20/2019  . Visit for preventive health examination 08/26/2018  . Screening-pulmonary TB 08/26/2018  . Left ankle injury, subsequent encounter 02/27/2018  . Allergic rhinitis 12/03/2017  . Obesity 02/15/2016  . Pseudotumor cerebri 12/13/2015  . Hyperpigmentation 11/17/2015  . Bipolar depression ([place]) 05/27/2015  . Dysmenorrhea in the adolescent 07/04/2011  . 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 Smoking status: Never Smoker  . Smokeless tobacco: Never Used  Substance Use Topics  . Alcohol use: No    Alcohol/week: 0.0 standard drinks   No current outpatient medications on file.  Allergies  Allergen Reactions  . Lupron [Leuprolide Acetate]     Was allergic to a preservative in the Lupron.     Objective:   There were no vitals taken for this visit.  Patient is well-developed, well-nourished in no acute distress.  Resting comfortably at home.  Head is normocephalic, atraumatic.  No labored breathing.  Speech is clear and coherent with logical contest.  Patient is alert and oriented at baseline.   Assessment and Plan:   1. Vaginal discharge Sounds physiologic. Is virgin so no concern for STI. Will have her come in for urine ancillary testing to check for yeast or BV. Will treat based on findings.  - Urine cytology ancillary only([place]); Future    Leeanne Rio, PA-C 06/12/2019

## 2019-06-16 ENCOUNTER — Encounter: Payer: BC Managed Care – PPO | Admitting: Physical Therapy

## 2019-06-17 ENCOUNTER — Other Ambulatory Visit: Payer: Self-pay | Admitting: Emergency Medicine

## 2019-06-17 DIAGNOSIS — N760 Acute vaginitis: Secondary | ICD-10-CM

## 2019-06-17 DIAGNOSIS — B9689 Other specified bacterial agents as the cause of diseases classified elsewhere: Secondary | ICD-10-CM

## 2019-06-17 LAB — URINE CYTOLOGY ANCILLARY ONLY
Bacterial vaginitis: POSITIVE — AB
Candida vaginitis: NEGATIVE

## 2019-06-17 MED ORDER — METRONIDAZOLE 500 MG PO TABS: 500.0000 mg | ORAL_TABLET | Freq: Two times a day (BID) | ORAL | 0 refills | Status: AC

## 2019-06-19 ENCOUNTER — Encounter: Payer: BC Managed Care – PPO | Admitting: Physical Therapy

## 2019-06-23 ENCOUNTER — Encounter: Payer: BC Managed Care – PPO | Admitting: Physical Therapy

## 2019-06-24 ENCOUNTER — Ambulatory Visit: Payer: BC Managed Care – PPO | Admitting: Family Medicine

## 2019-06-26 ENCOUNTER — Telehealth: Payer: Self-pay | Admitting: Neurology

## 2019-06-26 ENCOUNTER — Encounter: Payer: BC Managed Care – PPO | Admitting: Physical Therapy

## 2019-06-26 NOTE — Telephone Encounter (Signed)
Noted. See below.  

## 2019-06-26 NOTE — Telephone Encounter (Signed)
Mom called in and wanted to let us know that daughter went to get eye exam and that she has to get fluid off her eye because of pressure build-up. And she was thinking patient will probably have to have lumbar puncture again. Just FYI. Thanks!

## 2019-06-27 ENCOUNTER — Telehealth: Payer: Self-pay | Admitting: Physician Assistant

## 2019-06-27 NOTE — Telephone Encounter (Signed)
Pt hasn't seen me in years.  Looks like she had transferred her care to [facility].  She was just seen by them on 7/30 by their neurologist.  She will need to follow up with them.  It does appear that they recommended LP, but if they want that, they will need to be the ones scheduling it for her.

## 2019-06-27 NOTE — Telephone Encounter (Signed)
They went to [facility] for a second opinion.

## 2019-06-27 NOTE — Telephone Encounter (Signed)
Spoke with patient mom she states that they would like to transfer patient care back to Dr. Carles Collet  Does she need a new referral?

## 2019-06-27 NOTE — Telephone Encounter (Signed)
Pt mother called in stating that they would like pt to see Dr. Carles Collet again, she states when she called to schedule an appt she was told that they need a referral to start seeing Dr. Carles Collet. Please advise on the referral.   Please call mom [place] at [phone].

## 2019-06-30 ENCOUNTER — Encounter: Payer: BC Managed Care – PPO | Admitting: Physical Therapy

## 2019-06-30 NOTE — Telephone Encounter (Signed)
Ok to place referral. Will be up to Neurology if they will accept as she went somewhere else for 2nd opinion.

## 2019-06-30 NOTE — Telephone Encounter (Signed)
Called patient mother no answer left message with provider response

## 2019-06-30 NOTE — Telephone Encounter (Signed)
We generally don't encourage patients to go back and forth between practices.  She left our practice to transfer to [facility] and then was just seen by them within the last week.  I think that she probably needs to stay with [facility] at this point in time.

## 2019-06-30 NOTE — Telephone Encounter (Signed)
Patient has an appointment with Dr Tat on 07/11/19

## 2019-07-02 ENCOUNTER — Other Ambulatory Visit: Payer: Self-pay | Admitting: Neurology

## 2019-07-02 DIAGNOSIS — G932 Benign intracranial hypertension: Secondary | ICD-10-CM

## 2019-07-03 ENCOUNTER — Encounter: Payer: BC Managed Care – PPO | Admitting: Physical Therapy

## 2019-07-04 ENCOUNTER — Ambulatory Visit (INDEPENDENT_AMBULATORY_CARE_PROVIDER_SITE_OTHER): Payer: BC Managed Care – PPO | Admitting: Physician Assistant

## 2019-07-04 ENCOUNTER — Other Ambulatory Visit: Payer: Self-pay

## 2019-07-04 ENCOUNTER — Encounter: Payer: Self-pay | Admitting: Physician Assistant

## 2019-07-04 DIAGNOSIS — G932 Benign intracranial hypertension: Secondary | ICD-10-CM

## 2019-07-04 NOTE — Patient Instructions (Signed)
Instructions sent to my chart.  Please download a calorie tracking app on your phone.  1 option is the my fitness pal app.  Would like for you to start recording your daily caloric intake.  Include everything you are eating.  This is so we can get a better idea of how many calories you are actually taking it.  Please send me some of these results to your my chart.  Increase your walking time to 30 minutes 3 times weekly over the next 1 to 2 weeks.  After that would like for you to increase to 150 minutes a week of aerobic exercise.  You will be contacted for assessment by weight management specialist. Continue care recommended by your neurologist.

## 2019-07-04 NOTE — Progress Notes (Signed)
Virtual Visit via Video   I connected with patient on 07/04/19 at  1:30 PM EDT by a video enabled telemedicine application and verified that I am speaking with the correct person using two identifiers.  Location patient: Home Location provider: [facility], Office Persons participating in the virtual visit: Patient, Provider, [place] (Pationa Moore)  I discussed the limitations of evaluation and management by telemedicine and the availability of in person appointments. The patient expressed understanding and agreed to proceed.  Subjective:   HPI:   Patient presents via Doxy.Me today to discuss options for weight loss. Patient with IIH, recently seen by Neurology at [facility] with upcoming LP scheduled. Eye examination revealed concern for mild papilledema.  Specialist recommended that she start a diet and exercise program for goal to lose 5 to 10% weight loss.  In terms of exercise, patient endorses walking 1-2 x week, around the court at her housing complex, about 30 minutes each. Is trying to watch her diet but notes this is very hard especially since she is stuck at home and not working due to [place].  A typical daily eating regimen includes the following:  Breakfast -- not usually anything for breakfast.  AM Snacking -- usually junk foods Lunch -- Usually eating fast foods Afternoon Snacking -- usually chips Dinner -- Varies. 50/50 fast food and meals home. Home meals are heavy in carbs, especially potatoes PM Snacking -- Pizza,cookies. 2-3 AM -- wakes up and gets chips to eat.   Notes she has never used a calorie tracking app.  Wants to avoid use of any medications for weight loss if possible.  Is interested in seeing a weight loss specialist.   ROS:   See pertinent positives and negatives per HPI.  Patient Active Problem List   Diagnosis Date Noted  . 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 Mood disorder ([place]) 05/20/2019  . Piriformis syndrome of right side 05/20/2019  .  Visit for preventive health examination 08/26/2018  . Screening-pulmonary TB 08/26/2018  . Left ankle injury, subsequent encounter 02/27/2018  . Allergic rhinitis 12/03/2017  . Obesity 02/15/2016  . Pseudotumor cerebri 12/13/2015  . Hyperpigmentation 11/17/2015  . Bipolar depression ([place]) 05/27/2015  . Dysmenorrhea in the adolescent 07/04/2011  . 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 Smoking status: Never Smoker  . Smokeless tobacco: Never Used  Substance Use Topics  . Alcohol use: No    Alcohol/week: 0.0 standard drinks   No current outpatient medications on file.  Allergies  Allergen Reactions  . Lupron [Leuprolide Acetate]     Was allergic to a preservative in the Lupron.    Objective:   Wt 268 lb (121.6 kg)   BMI 43.26 kg/m   Patient is well-developed, well-nourished in no acute distress.  Resting comfortably at home.  Head is normocephalic, atraumatic.  No labored breathing.  Speech is clear and coherent with logical contest.  Patient is alert and oriented at baseline.   Assessment and Plan:   1. Morbid obesity ([place]) 2. Idiopathic intracranial hypertension Patient needing to lose 5 to 10% body weight to help with her IIH.  She is mainly sedentary at present with pretty poor eating habits and excessive caloric intake (estimated).  Encouraged her to first download a calorie tracking app so she can keep a 48-hour journal of food she is eating and calorie values.  Feel the first step is for her to realize how much she is  taking in versus how much she actually needs.  She is going to send us these results to her MyChart.  She is going to increase to walking 30 minutes 3 times weekly over the next 2 weeks.  Once meeting that goal she is going to increase to 150 minutes a week of moderate intensity aerobic exercise.  Referral to weight management placed as well.  If she has not seen a specialist within a month  we will plan on having her follow-up here in the office.    Piedad ClimesWilliam Cody Graelyn Bihl, PA-C 07/04/2019

## 2019-07-04 NOTE — Progress Notes (Signed)
I have discussed the procedure for the virtual visit with the patient who has given consent to proceed with assessment and treatment.   Annette Molina S Dari Carpenito, CMA     

## 2019-07-08 ENCOUNTER — Other Ambulatory Visit: Payer: Self-pay

## 2019-07-08 ENCOUNTER — Ambulatory Visit
Admission: RE | Admit: 2019-07-08 | Discharge: 2019-07-08 | Disposition: A | Discharge status: Home / Self Care | Payer: BC Managed Care – PPO | Source: Ambulatory Visit | Attending: Neurology | Admitting: Neurology

## 2019-07-08 DIAGNOSIS — G932 Benign intracranial hypertension: Secondary | ICD-10-CM

## 2019-07-08 NOTE — Discharge Instructions (Signed)

## 2019-07-11 ENCOUNTER — Telehealth: Payer: BC Managed Care – PPO | Admitting: Neurology

## 2019-07-16 ENCOUNTER — Ambulatory Visit (INDEPENDENT_AMBULATORY_CARE_PROVIDER_SITE_OTHER): Payer: BC Managed Care – PPO | Admitting: Internal Medicine

## 2019-07-16 ENCOUNTER — Encounter: Payer: Self-pay | Admitting: Internal Medicine

## 2019-07-16 ENCOUNTER — Other Ambulatory Visit: Payer: Self-pay

## 2019-07-16 VITALS — BP 122/85 | HR 93 | Temp 97.1°F | Resp 16 | Ht 66.0 in | Wt 269.5 lb

## 2019-07-16 DIAGNOSIS — L723 Sebaceous cyst: Secondary | ICD-10-CM | POA: Diagnosis not present

## 2019-07-16 MED ORDER — CEPHALEXIN 500 MG PO CAPS: 500.0000 mg | ORAL_CAPSULE | Freq: Four times a day (QID) | ORAL | 0 refills | Status: DC

## 2019-07-16 NOTE — Patient Instructions (Signed)
You have "sebaceous cyst" at the chest.  It looks infected today  Please take an antibiotic for 5 days  Apply a warm compress 3 times a day  If not better let us know  If the pain and tenderness come back also let us know.    Epidermal Cyst  An epidermal cyst is a sac made of skin tissue. The sac contains a substance called keratin. Keratin is a protein that is normally secreted through the hair follicles. When keratin becomes trapped in the top layer of skin (epidermis), it can form an epidermal cyst. Epidermal cysts can be found anywhere on your body. These cysts are usually harmless (benign), and they may not cause symptoms unless they become infected. What are the causes? This condition may be caused by:  A blocked hair follicle.  A hair that curls and re-enters the skin instead of growing straight out of the skin (ingrown hair).  A blocked pore.  Irritated skin.  An injury to the skin.  Certain conditions that are passed along from parent to child (inherited).  Human papillomavirus (HPV).  Long-term (chronic) sun damage to the skin. What increases the risk? The following factors may make you more likely to develop an epidermal cyst:  Having acne.  Being overweight.  Being 1530-22 years old. What are the signs or symptoms? The only symptom of this condition may be a small, painless lump underneath the skin. When an epidermal cyst ruptures, it may become infected. Symptoms may include:  Redness.  Inflammation.  Tenderness.  Warmth.  Fever.  Keratin draining from the cyst. Keratin is grayish-white, bad-smelling substance.  Pus draining from the cyst. How is this diagnosed? This condition is diagnosed with a physical exam.  In some cases, you may have a sample of tissue (biopsy) taken from your cyst to be examined under a microscope or tested for bacteria.  You may be referred to a health care provider who specializes in skin care (dermatologist). How  is this treated? In many cases, epidermal cysts go away on their own without treatment. If a cyst becomes infected, treatment may include:  Opening and draining the cyst, done by a health care provider. After draining, minor surgery to remove the rest of the cyst may be done.  Antibiotic medicine.  Injections of medicines (steroids) that help to reduce inflammation.  Surgery to remove the cyst. Surgery may be done if the cyst: ? Becomes large. ? Bothers you. ? Has a chance of turning into cancer.  Do not try to open a cyst yourself. Follow these instructions at home:  Take over-the-counter and prescription medicines only as told by your health care provider.  If you were prescribed an antibiotic medicine, take it it as told by your health care provider. Do not stop using the antibiotic even if you start to feel better.  Keep the area around your cyst clean and dry.  Wear loose, dry clothing.  Avoid touching your cyst.  Check your cyst every day for signs of infection. Check for: ? Redness, swelling, or pain. ? Fluid or blood. ? Warmth. ? Pus or a bad smell.  Keep all follow-up visits as told by your health care provider. This is important. How is this prevented?  Wear clean, dry, clothing.  Avoid wearing tight clothing.  Keep your skin clean and dry. Take showers or baths every day. Contact a health care provider if:  Your cyst develops symptoms of infection.  Your condition is not improving or is  getting worse.  You develop a cyst that looks different from other cysts you have had.  You have a fever. Get help right away if:  Redness spreads from the cyst into the surrounding area. Summary  An epidermal cyst is a sac made of skin tissue. These cysts are usually harmless (benign), and they may not cause symptoms unless they become infected.  If a cyst becomes infected, treatment may include surgery to open and drain the cyst, or to remove it. Treatment may also  include medicines by mouth or through an injection.  Take over-the-counter and prescription medicines only as told by your health care provider. If you were prescribed an antibiotic medicine, take it as told by your health care provider. Do not stop using the antibiotic even if you start to feel better.  Contact a health care provider if your condition is not improving or is getting worse.  Keep all follow-up visits as told by your health care provider. This is important. This information is not intended to replace advice given to you by your health care provider. Make sure you discuss any questions you have with your health care provider. Document Released: 10/14/2004 Document Revised: 03/06/2019 Document Reviewed: 05/27/2018 Elsevier Patient Education  2020 [employer].

## 2019-07-16 NOTE — Progress Notes (Signed)
Subjective:    Patient ID: Annette Molina, female    DOB: 08/16/1996, 23 y.o.   MRN: [id number]  DOS:  07/16/2019 Type of visit - description: Acute 2 days ago, she found a knot between the breasts. The area has grown a little in size since then and is tender to palpation. Patient is concerned, is very afraid of cancer in general   Review of Systems Denies fever chills No injury in that area Has not seen any discharge  Past Medical History:  Diagnosis Date  . ADHD (attention deficit hyperactivity disorder)   . Depression   . Precocious puberty    Was followed by endocrinology.    Past Surgical History:  Procedure Laterality Date  . NO PAST SURGERIES      Social History   Socioeconomic History  . Marital status: Single    Spouse name: Not on file  . Number of children: 0  . Years of education: Not on file  . Highest education level: Not on file  Occupational History  . 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 Financial resource strain: Not on file  . Food insecurity    Worry: Not on file    Inability: Not on file  . Transportation needs    Medical: Not on file    Non-medical: Not on file  Tobacco Use  . Smoking status: Never Smoker  . Smokeless tobacco: Never Used  Substance and Sexual Activity  . Alcohol use: No    Alcohol/week: 0.0 standard drinks  . Drug use: No  . Sexual activity: Never  Lifestyle  . Physical activity    Days per week: Not on file    Minutes per session: Not on file  . Stress: Not on file  Relationships  . Social Musicianconnections    Talks on phone: Not on file    Gets together: Not on file    Attends religious service: Not on file    Active member of club or organization: Not on file    Attends meetings of clubs or organizations: Not on file    Relationship status: Not on file  . Intimate partner violence    Fear of current or ex partner: Not on file    Emotionally abused: Not on file    Physically  abused: Not on file    Forced sexual activity: Not on file  Other Topics Concern  . Not on file  Social History Narrative   Caffeine Use:  3 daily   Regular Exercise:  2-3 x weekly   Lives with Mom step dad and brother   Attends [employer].   Patient is the granddaughter of Moise BoringGloria Molina            Allergies as of 07/16/2019      Reactions   Lupron [leuprolide Acetate]    Was allergic to a preservative in the Lupron.      Medication List    as of July 16, 2019  3:16 PM   You have not been prescribed any medications.         Objective:   Physical Exam Chest:      BP 122/85 (BP Location: Left Arm, Patient Position: Sitting, Cuff Size: Normal)   Pulse 93   Temp (!) 97.1 F (36.2 C) (Temporal)   Resp 16   Ht 5\' 6"  (1.676 m)   Wt 269 lb 8 oz (122.2 kg)   LMP 07/09/2019 (Exact Date)  SpO2 98%   BMI 43.50 kg/m  General:   Well developed, NAD, BMI noted. HEENT:  Normocephalic . Face symmetric, atraumatic  Neurologic:  alert & oriented X3.  Speech normal, gait appropriate for age and unassisted Psych--  Cognition and judgment appear intact.  Cooperative with normal attention span and concentration.  Behavior appropriate. No anxious or depressed appearing.      Assessment    23 year old female, PMH includes ADHD, depression, no previous surgeries, currently on no meds presents with: Sebaceous cyst, infected: Findings consistent with above, patient is very concerned, explained her this is is a benign condition, does not seem related to her breasts. Rx cephalexin, warm compress, call if not better, she is aware that the cyst can get infected again, if that is the case she will let us know.  She verbalized understanding.

## 2019-07-16 NOTE — Progress Notes (Signed)
Pre visit review using our clinic review tool, if applicable. No additional management support is needed unless otherwise documented below in the visit note. 

## 2019-08-12 ENCOUNTER — Other Ambulatory Visit: Payer: Self-pay | Admitting: Family Medicine

## 2019-08-13 ENCOUNTER — Ambulatory Visit (HOSPITAL_COMMUNITY): Payer: BC Managed Care – PPO | Admitting: Psychiatry

## 2019-08-13 ENCOUNTER — Other Ambulatory Visit: Payer: Self-pay

## 2019-08-26 ENCOUNTER — Other Ambulatory Visit: Payer: Self-pay

## 2019-08-26 ENCOUNTER — Ambulatory Visit (INDEPENDENT_AMBULATORY_CARE_PROVIDER_SITE_OTHER): Payer: BC Managed Care – PPO | Admitting: Psychiatry

## 2019-08-26 DIAGNOSIS — F3181 Bipolar II disorder: Secondary | ICD-10-CM

## 2019-08-26 DIAGNOSIS — F410 Panic disorder [episodic paroxysmal anxiety] without agoraphobia: Secondary | ICD-10-CM

## 2019-08-26 DIAGNOSIS — F902 Attention-deficit hyperactivity disorder, combined type: Secondary | ICD-10-CM | POA: Insufficient documentation

## 2019-08-26 MED ORDER — AMPHETAMINE-DEXTROAMPHETAMINE 10 MG PO TABS: 10.0000 mg | ORAL_TABLET | Freq: Two times a day (BID) | ORAL | 0 refills | Status: DC

## 2019-08-26 MED ORDER — ARIPIPRAZOLE 10 MG PO TABS: 10.0000 mg | ORAL_TABLET | Freq: Every day | ORAL | 0 refills | Status: DC

## 2019-08-26 MED ORDER — CLONAZEPAM 1 MG PO TBDP: 1.0000 mg | ORAL_TABLET | Freq: Two times a day (BID) | ORAL | 1 refills | Status: DC | PRN

## 2019-08-26 NOTE — Progress Notes (Signed)
Psychiatric Initial Adult Assessment   Patient Identification: Annette Molina MRN:  [id number] Date of Evaluation:  08/26/2019 Referral Source: Marcelline MatesWilliam Martin PA-C Chief Complaint:  Depression, mood lability, anxiety Visit Diagnosis:    ICD-10-CM   1. Bipolar 2 disorder, major depressive episode (HCC)  F31.81   2. Attention deficit hyperactivity disorder (ADHD), combined type  F90.2   3. Panic disorder  F41.0   Interview was conducted using WebEx teleconferencing application and I verified that I was speaking with the correct person using two identifiers. I discussed the limitations of evaluation and management by telemedicine and  the availability of in person appointments. Patient expressed understanding and agreed to proceed.  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 She also has daily panic attacks with no identifiable triggers. She reports that her mood swings occur multiple times within a day and are not in response to environmental triggers/stressors. She denies feeling hopeless or suicidal but admits once having passive suicidal over 5 years ago. She did not go to the hospital then and  Has no hx of inpatient psychiatric admissions. She was diagnosed with bipolar 2 disorder and has been recently under care of Dara HoyerLucy Skeen  [facility] who has tried her on Latude then Abilify which she still takes at 5 mg daily. She did not notice any improvement in her mood on either. Annette Molina has not been ever tried on any mood stabilizers (lithium, divalproate, carbamazepine, lamotrigine) but was briefly on quetiapine which she also found ineffective (only 50 mg though). She has been diagnosed with ADHD at age 23  And was on Adderall and Strattera in the past. She found the former quite helpful. She has no hx of  alcohol or dug abuse.   Associated Signs/Symptoms: Depression Symptoms:  depressed mood, psychomotor agitation, difficulty concentrating, panic attacks, (Hypo) Manic Symptoms:  Distractibility, Flight of Ideas, Impulsivity, Irritable Mood, Labiality of Mood, Anxiety Symptoms:  Panic Symptoms, Psychotic Symptoms:  None PTSD Symptoms: Negative  Past Psychiatric History: see above  Previous Psychotropic Medications: Yes   Substance Abuse History in the last 12 months:  No.  Consequences of Substance Abuse: NA  Past Medical History:  Past Medical History:  Diagnosis Date  . ADHD (attention deficit hyperactivity disorder)   . Depression   . Precocious puberty    Was followed by endocrinology.    Past Surgical History:  Procedure Laterality Date  . NO PAST SURGERIES      Family Psychiatric History: She reports having 4 step-siblings on father's side having ADHD and possibly bipolar disorder.   Family History:  Family History  Problem Relation Age of Onset  . Hypertension Mother   . Diabetes Father   . Heart disease Father   . Cancer Neg Hx     Social History:   Social History   Socioeconomic History  . Marital status: Single    Spouse name: Not on file  . Number of children: 0  . Years of education: Not on file  . Highest education level: Not on file  Occupational History  . 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 Financial resource strain: Not on file  . Food insecurity    Worry: Not on file    Inability: Not on file  . Transportation needs    Medical: Not on  file    Non-medical: Not on file  Tobacco Use  . Smoking status: Never Smoker  . Smokeless tobacco: Never Used  Substance and Sexual Activity  . Alcohol use: No    Alcohol/week: 0.0 standard drinks  . Drug use: No  . Sexual activity: Never  Lifestyle  . Physical activity    Days per week: Not on file    Minutes per session: Not on file  . Stress: Not on file   Relationships  . Social Herbalist on phone: Not on file    Gets together: Not on file    Attends religious service: Not on file    Active member of club or organization: Not on file    Attends meetings of clubs or organizations: Not on file    Relationship status: Not on file  Other Topics Concern  . Not on file  Social History Narrative   Caffeine Use:  3 daily   Regular Exercise:  2-3 x weekly   Lives with Mom step dad and brother   Attends [employer].   Patient is the granddaughter of Juanetta Gosling          Additional Social History: Single. No children, student at [employer]. Lives with mother, step-father and brother.  Allergies:   Allergies  Allergen Reactions  . Lupron [Leuprolide Acetate]     Was allergic to a preservative in the Lupron.    Metabolic Disorder Labs: Lab Results  Component Value Date   HGBA1C 6.4 08/26/2018   No results found for: PROLACTIN Lab Results  Component Value Date   CHOL 171 08/26/2018   TRIG 146.0 08/26/2018   HDL 36.90 (L) 08/26/2018   CHOLHDL 5 08/26/2018   VLDL 29.2 08/26/2018   LDLCALC 105 (H) 08/26/2018   Lab Results  Component Value Date   TSH 1.97 01/21/2019    Therapeutic Level Labs: No results found for: LITHIUM No results found for: CBMZ No results found for: VALPROATE  Current Medications: Current Outpatient Medications  Medication Sig Dispense Refill  . cephALEXin (KEFLEX) 500 MG capsule Take 1 capsule (500 mg total) by mouth 4 (four) times daily. 20 capsule 0   No current facility-administered medications for this visit.    Psychiatric Specialty Exam: Review of Systems  Psychiatric/Behavioral: Positive for depression. The patient is nervous/anxious and has insomnia.   All other systems reviewed and are negative.   There were no vitals taken for this visit.There is no height or weight on file to calculate BMI.  General Appearance: Casual and Fairly Groomed  Eye Contact:  Good  Speech:  Clear and  Coherent and Normal Rate  Volume:  Normal  Mood:  Depressed and Irritable  Affect:  Full Range  Thought Process:  Goal Directed and Linear  Orientation:  Full (Time, Place, and Person)  Thought Content:  Logical  Suicidal Thoughts:  No  Homicidal Thoughts:  No  Memory:  Immediate;   Fair Recent;   Fair Remote;   Good  Judgement:  Good  Insight:  Fair  Psychomotor Activity:  Normal  Concentration:  Concentration: Fair  Recall:  [place] of Knowledge:Fair  Language: Good  Akathisia:  Negative  Handed:  Right  AIMS (if indicated):  not done  Assets:  Communication Skills Desire for Improvement Financial Resources/Insurance Housing Physical Health Social Support  ADL's:  Intact  Cognition: WNL  Sleep:  Fair   Screenings: GAD-7     Office Visit from 04/29/2019 in [facility]  Total GAD-7 Score  14    PHQ2-9     Office Visit from 04/29/2019 in [facility] Office Visit from 08/26/2018 in [facility] Office Visit from 11/10/2016 in [facility]  PHQ-2 Total Score  4  0  4  PHQ-9 Total Score  13  1  10       Assessment and Plan: Patient is 23 yo single AAF a 22 at [employer] who comes reporting over 10 year hx of rapid mood fluctuations, racing thoughts, insomnia, anger/irritability and problems with focusing/atttemtion. She also has daily panic attacks with no identifiable triggers. She reports that her mood swings occur multiple times within a day and are not in response to environmental triggers/stressors. She denies feeling hopeless or suicidal but admits once having passive suicidal over 5 years ago. She did not go to the hospital then and  Has no hx of inpatient psychiatric admissions. She was diagnosed with bipolar 2 disorder and has been recently under care of [facility] who has tried her on  Latude then Abilify which she still takes at 5 mg daily. She did not notice any improvement in her mood on either. [facility] has not been ever tried on any mood stabilizers (lithium, divalproate, carbamazepine, lamotrigine) but was briefly on quetiapine which she also found ineffective (only 50 mg though). She has been diagnosed with ADHD at age 84  And was on Adderall and Strattera in the past. She found the former quite helpful. She has no hx of alcohol or dug abuse.  Dx: ADHD combined; Bipolar 2 disorder rapid cycling; Panic disorder  Plan: We will continue Abilify but increase dose to 10 mg at HS. We will restart Adderall IR 10 mg bid and add clonazepam ODT 1 mg bid prn panic attacks. She would like to have a counseling appointment at our practice as well. The plan was discussed with patient who had an opportunity to ask questions and these were all answered. I spend 60 minutes in videoconferencing with the patient and devoted approximately 50% of this time to explanation of diagnosis, discussion of treatment options and med education.   14, MD 9/29/20201:44 PM

## 2019-09-01 ENCOUNTER — Telehealth (HOSPITAL_COMMUNITY): Payer: Self-pay | Admitting: Professional

## 2019-09-01 ENCOUNTER — Other Ambulatory Visit: Payer: Self-pay

## 2019-09-01 ENCOUNTER — Encounter (HOSPITAL_COMMUNITY): Payer: Self-pay | Admitting: Licensed Clinical Social Worker

## 2019-09-01 ENCOUNTER — Ambulatory Visit (INDEPENDENT_AMBULATORY_CARE_PROVIDER_SITE_OTHER): Payer: BC Managed Care – PPO | Admitting: Licensed Clinical Social Worker

## 2019-09-01 DIAGNOSIS — F3181 Bipolar II disorder: Secondary | ICD-10-CM | POA: Diagnosis not present
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
## 2019-09-03 ENCOUNTER — Other Ambulatory Visit (HOSPITAL_COMMUNITY): Payer: BC Managed Care – PPO | Attending: Psychiatry | Admitting: Licensed Clinical Social Worker

## 2019-09-03 ENCOUNTER — Other Ambulatory Visit: Payer: Self-pay

## 2019-09-03 DIAGNOSIS — Z79899 Other long term (current) drug therapy: Secondary | ICD-10-CM | POA: Insufficient documentation

## 2019-09-03 DIAGNOSIS — F902 Attention-deficit hyperactivity disorder, combined type: Secondary | ICD-10-CM | POA: Insufficient documentation

## 2019-09-03 DIAGNOSIS — R45851 Suicidal ideations: Secondary | ICD-10-CM | POA: Insufficient documentation

## 2019-09-03 DIAGNOSIS — F419 Anxiety disorder, unspecified: Secondary | ICD-10-CM | POA: Insufficient documentation

## 2019-09-03 DIAGNOSIS — F3181 Bipolar II disorder: Secondary | ICD-10-CM

## 2019-09-03 NOTE — Psych (Signed)
Annette Molina is a 23 y.o. female patient with mood symptoms. Pt was scheduled to begin virtual PHP today. Pt arrived in group 75 minutes late and left 15 minutes later. Pt was then gone for another 50 minutes before coming in for 5 minutes and leaving again. Pt did not turn on her camera and said little, however tone was bright and apologetic. Cln attempted to follow up via phone and was unable to connect with pt.         Lorin Glass, LCSW

## 2019-09-04 ENCOUNTER — Other Ambulatory Visit: Payer: Self-pay

## 2019-09-04 ENCOUNTER — Other Ambulatory Visit (HOSPITAL_COMMUNITY): Payer: BC Managed Care – PPO

## 2019-09-04 NOTE — Telephone Encounter (Signed)
Cln called to discuss PHP with pt. Cln did not receive pt pw. Pt came to group late yesterday and was in/out of group. Pt reports she did come late but she never left group, she was "helping my elderly grandmother. I do that every day." Cln told pt she will need a private space during group for confidentiality reasons and will not be able to do group and something else. Pt agrees. Pt reports she is printing paperwork now and will email to [email or web address]. Pt denies SI/HI/AVH at this time.

## 2019-09-05 ENCOUNTER — Other Ambulatory Visit (HOSPITAL_COMMUNITY): Payer: BC Managed Care – PPO | Admitting: Family

## 2019-09-05 ENCOUNTER — Telehealth (HOSPITAL_COMMUNITY): Payer: Self-pay | Admitting: Professional

## 2019-09-05 ENCOUNTER — Other Ambulatory Visit: Payer: Self-pay

## 2019-09-05 NOTE — Telephone Encounter (Signed)
Cln called to f.u abt PHP. Pt wants to switch groups "because my feelings were hurt that the lady said I wasn't in group the whole time on Wednesday." Cln talks to pt about being present and fully focused on group. Cln tells pt we don't allow pts just to switch groups because of feelings being hurt. Pt reports she doesn't want to come back to PHP. Cln will talk to provider and reach back out to pt. Pt understands. Pt denies current SI/HI/AVH.

## 2019-09-08 ENCOUNTER — Other Ambulatory Visit: Payer: Self-pay

## 2019-09-08 ENCOUNTER — Telehealth (HOSPITAL_COMMUNITY): Payer: Self-pay | Admitting: Psychiatry

## 2019-09-08 ENCOUNTER — Other Ambulatory Visit (HOSPITAL_COMMUNITY): Payer: BC Managed Care – PPO

## 2019-09-08 NOTE — Telephone Encounter (Signed)
D:  Pt was referred to MH-IOP by PHP.  A:  Placed call to orient pt and provide pt with a start date.  She will start on 09-10-19.  Requested that pt forward all registration info and insurance card to case manager.  R:  Pt receptive.

## 2019-09-09 ENCOUNTER — Telehealth (HOSPITAL_COMMUNITY): Payer: Self-pay | Admitting: Psychiatry

## 2019-09-09 ENCOUNTER — Other Ambulatory Visit (HOSPITAL_COMMUNITY): Payer: BC Managed Care – PPO

## 2019-09-10 ENCOUNTER — Encounter (HOSPITAL_COMMUNITY): Payer: Self-pay | Admitting: Psychiatry

## 2019-09-10 ENCOUNTER — Other Ambulatory Visit (HOSPITAL_COMMUNITY): Payer: BC Managed Care – PPO

## 2019-09-10 ENCOUNTER — Other Ambulatory Visit (HOSPITAL_COMMUNITY): Payer: BC Managed Care – PPO | Admitting: Psychiatry

## 2019-09-10 ENCOUNTER — Other Ambulatory Visit: Payer: Self-pay

## 2019-09-10 DIAGNOSIS — F902 Attention-deficit hyperactivity disorder, combined type: Secondary | ICD-10-CM

## 2019-09-10 DIAGNOSIS — R45851 Suicidal ideations: Secondary | ICD-10-CM | POA: Diagnosis not present

## 2019-09-10 DIAGNOSIS — F419 Anxiety disorder, unspecified: Secondary | ICD-10-CM | POA: Diagnosis not present

## 2019-09-10 DIAGNOSIS — F3181 Bipolar II disorder: Secondary | ICD-10-CM | POA: Diagnosis present

## 2019-09-10 DIAGNOSIS — Z79899 Other long term (current) drug therapy: Secondary | ICD-10-CM | POA: Diagnosis not present

## 2019-09-10 NOTE — Progress Notes (Signed)
Virtual Visit via Video Note  I connected with Annette Molina on 09/10/19 at  9:00 AM EDT by a video enabled telemedicine application and verified that I am speaking with the correct person using two identifiers.  Location: Patient: Forensic psychologist Provider: Lise Auer, LCSW   I discussed the limitations of evaluation and management by telemedicine and the availability of in person appointments. The patient expressed understanding and agreed to proceed.  History of Present Illness: Bipolar 2 DO   Observations/Objective: Case Manager checked in with all participants to review discharge dates, insurance authorizations, work-related documents and needs for the treatment team. Counselor processed current mood and functioning and discussed how participants spent their time since last session and if skills were applied. Today was Annette Molina's first IOP session. She presented with moderate anxiety and moderate depression. She shared about her current life stressors, daily functioning, living situation and what she hopes to gain from treatment. Counselor presented on and covered 3 websites which discussed coping skills for anxiety, depression and "stressed out adults." Group members shared feedback and identified a variety of coping skills they are willing to implement to address mental health symptoms. Annette Molina shared that she would be willing to do more physical activities and getting into creative outlets, amongst other coping skills. Counselor facilitated the group with a guided imagery script on "Becoming More Playful and Less Serious to expose them to deep breathing, progressive muscle relaxation and tension, positive affirmations, meditation and guided imagery. Group members shared how the experience was for them. Annette Molina shared positive feedback on the experience and also found her thoughts drifting at times. In closing group members shared their plans for the afternoon. Annette Molina shared that she  has a job interview today and shared about her anxiety related.    Assessment and Plan: Counselor recommends that patient remains in IOP treatment to better manage mental health symptoms and continue to address treatment plan goals. Counselor recommends adherence to crisis/safety plan, taking medications as prescribed and following up with medical professionals if any issues arise.   Follow Up Instructions: Counselor will send Webex link for next session.    I discussed the assessment and treatment plan with the patient. The patient was provided an opportunity to ask questions and all were answered. The patient agreed with the plan and demonstrated an understanding of the instructions.   The patient was advised to call back or seek an in-person evaluation if the symptoms worsen or if the condition fails to improve as anticipated.  I provided 180 minutes of non-face-to-face time during this encounter.   Lise Auer, LCSW
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
## 2019-09-11 ENCOUNTER — Encounter (HOSPITAL_COMMUNITY): Payer: Self-pay

## 2019-09-11 ENCOUNTER — Other Ambulatory Visit: Payer: Self-pay

## 2019-09-11 ENCOUNTER — Other Ambulatory Visit (HOSPITAL_COMMUNITY): Payer: BC Managed Care – PPO | Admitting: Psychiatry

## 2019-09-11 ENCOUNTER — Other Ambulatory Visit (HOSPITAL_COMMUNITY): Payer: BC Managed Care – PPO

## 2019-09-11 ENCOUNTER — Ambulatory Visit (HOSPITAL_COMMUNITY): Payer: BC Managed Care – PPO

## 2019-09-11 DIAGNOSIS — F3181 Bipolar II disorder: Secondary | ICD-10-CM | POA: Diagnosis not present

## 2019-09-11 NOTE — Progress Notes (Signed)
Virtual Visit via Video Note  I connected with Annette Molina on 09/11/19 at  9:00 AM EDT by a video enabled telemedicine application and verified that I am speaking with the correct person using two identifiers.  Location: Patient: Forensic psychologist Provider: Lise Auer, LCSW   I discussed the limitations of evaluation and management by telemedicine and the availability of in person appointments. The patient expressed understanding and agreed to proceed.  History of Present Illness: Bipolar 2 Disorder   Observations/Objective: Case Manager was on vacation today, so Counselor checked in with all participants to review discharge dates, insurance authorizations, work-related documents and needs for the treatment team. Counselor engaged the group by checking in to determine who completed their self-care act and to see everyone's current state and mood. Annette Molina presented with moderate depression and mild anxiety. She shared that she had a successful job interview yesterday and feels optimistic about the opportunity. She would like to continue to work on sharing her mental health needs and concerns with her family in a more effective way. Counselor introduced guest speaker, Annette Molina, Lead [employer], to facilitate a discussion around Grief and Loss topics.  Counselor prompted the group to journal about the G&L issues they would like to further process with their individual therapist. Counselor spent time allowing group members to share encouraging and inspirational words for a group member who is graduating today. Annette Molina shared kind and encouraging words. Counselor introduced Field seismologist, Annette Molina, Yoga Instructor, to guide the group in a yoga practice. Counselor checked in with all participants to assess the benefits. Annette Molina felt relaxed after the practice and enjoyed the quiet. Counselor closed by by having group members share a self-care task and productivity activity they will  do today. Annette Molina plans to work this afternoon and will try to talk with her family about group today.     Assessment and Plan: Counselor recommends that patient remains in IOP treatment to better manage mental health symptoms and continue to address treatment plan goals. Counselor recommends adherence to crisis/safety plan, taking medications as prescribed and following up with medical professionals if any issues arise.   Follow Up Instructions: Counselor will send Webex link for next session.    I discussed the assessment and treatment plan with the patient. The patient was provided an opportunity to ask questions and all were answered. The patient agreed with the plan and demonstrated an understanding of the instructions.   The patient was advised to call back or seek an in-person evaluation if the symptoms worsen or if the condition fails to improve as anticipated.  I provided 180 minutes of non-face-to-face time during this encounter.   Lise Auer, LCSW

## 2019-09-12 ENCOUNTER — Other Ambulatory Visit (HOSPITAL_COMMUNITY): Payer: BC Managed Care – PPO

## 2019-09-12 ENCOUNTER — Other Ambulatory Visit: Payer: Self-pay

## 2019-09-12 ENCOUNTER — Other Ambulatory Visit (HOSPITAL_COMMUNITY): Payer: BC Managed Care – PPO | Admitting: Psychiatry

## 2019-09-12 ENCOUNTER — Ambulatory Visit (HOSPITAL_COMMUNITY): Payer: BC Managed Care – PPO

## 2019-09-12 ENCOUNTER — Encounter (HOSPITAL_COMMUNITY): Payer: Self-pay

## 2019-09-12 DIAGNOSIS — F3181 Bipolar II disorder: Secondary | ICD-10-CM | POA: Diagnosis not present

## 2019-09-12 NOTE — Progress Notes (Signed)
Virtual Visit via Video Note  I connected with Ishi [name] on 09/12/19 at  9:00 AM EDT by a video enabled telemedicine application and verified that I am speaking with the correct person using two identifiers.  Location: Patient: Annette Molina Provider: Lise Auer, LCSW   I discussed the limitations of evaluation and management by telemedicine and the availability of in person appointments. The patient expressed understanding and agreed to proceed.  History of Present Illness: Bipolar 2   Observations/Objective: Case Manager checked in with all participants to review discharge dates, insurance authorizations, work-related documents and needs for the treatment team. Counselor processed current mood and functioning and discussed how participants spent their time since last session and if skills were applied. [name] shared about her plan to get her hair done for self-care. She reports feeling scattered and unsettled in life. She expressed a need to talk with the NP about medications. [name] opened up the topic of understanding mental health diagnosis with the group, which fostered an fruitful discussion about others experiences with becoming diagnosed. [name] presented with moderate depression and high anxiety. Counselor prompted the group to journal on the topic of forgiveness. Group members shared their reflections. Counselor shared information from an article called, "Myths and Stages of Forgiveness." Counselor engaged them in determining how the myths relate to their beliefs and where they find themselves in the stages. Group members had open conversation and dialogue on their thoughts, feelings and experiences with the topic. Counselor checked in with all group members to share their plans for the afternoon and weekend and encouraged application of coping skills and self-care. [name] will spend time with family and getting her hair done.  Assessment and Plan: Counselor recommends  that patient remains in IOP treatment to better manage mental health symptoms and continue to address treatment plan goals. Counselor recommends adherence to crisis/safety plan, taking medications as prescribed and following up with medical professionals if any issues arise.   Follow Up Instructions: Counselor will send Webex link for next session.    I discussed the assessment and treatment plan with the patient. The patient was provided an opportunity to ask questions and all were answered. The patient agreed with the plan and demonstrated an understanding of the instructions.   The patient was advised to call back or seek an in-person evaluation if the symptoms worsen or if the condition fails to improve as anticipated.  I provided 180 minutes of non-face-to-face time during this encounter.   Lise Auer, LCSW

## 2019-09-15 ENCOUNTER — Encounter (HOSPITAL_COMMUNITY): Payer: Self-pay

## 2019-09-15 ENCOUNTER — Other Ambulatory Visit (HOSPITAL_COMMUNITY): Payer: BC Managed Care – PPO | Admitting: Psychiatry

## 2019-09-15 ENCOUNTER — Other Ambulatory Visit (HOSPITAL_COMMUNITY): Payer: BC Managed Care – PPO

## 2019-09-15 ENCOUNTER — Other Ambulatory Visit: Payer: Self-pay

## 2019-09-15 DIAGNOSIS — F3181 Bipolar II disorder: Secondary | ICD-10-CM

## 2019-09-15 MED ORDER — ARIPIPRAZOLE 10 MG PO TABS: 10.0000 mg | ORAL_TABLET | Freq: Every day | ORAL | 0 refills | Status: DC

## 2019-09-15 NOTE — Progress Notes (Signed)
Medication was refilled. Abilify 10 mg  NP . T.Bobby Rumpf

## 2019-09-15 NOTE — Progress Notes (Signed)
Virtual Visit via Video Note  I connected with Annette Molina on 09/15/19 at  9:00 AM EDT by a video enabled telemedicine application and verified that I am speaking with the correct person using two identifiers.  Location: Patient: Annette Molina Provider: Lise Auer, LCSW   I discussed the limitations of evaluation and management by telemedicine and the availability of in person appointments. The patient expressed understanding and agreed to proceed.  History of Present Illness: Bipolar 2 DO   Observations/Objective: Case Manager checked in with all participants to review discharge dates, insurance authorizations, work-related documents and needs for the treatment team. Counselor processed current mood and functioning and discussed how participants spent their time since last session and if skills were applied. Annette Molina experienced difficulties with her Internet, which had her frustrated this morning. However, she stated she was trying to use positive mentality coping strategies to become regulated again. She shared that over the weekend she decided to quit both of her jobs, presenting as impulsive and manic in her decision making. When asked direct questions, Annette Molina presents as distracted and confused, not following along with conversation or processing information differently than others. Her responses are "good", but not tracking with the line of questioning or on topic. Annette Molina did present and discuss within the group, the pros and cons of filing for disability as it relates to her mental health. Annette Molina presented with mild depression and high anxiety. Counselor provided a template for creating their individualized safety/crisis plans. Counselor walked through the document step by step, explaining purpose, sharing ideas and concepts to consider, as well as communication with others (supports and professionals) about the plan and application. Group members shared about the components of  their plan throughout the session. Annette Molina was able to identify triggers, symptoms, support and professional contacts, how to stay safe and motivators for living. She identified that her mom and best friend are her reasons for living.  Counselor provided psychoeducation about the creation of S.M.A.R.T goals. Counselor provided the group members with a handout to document their goals related to mental health, health, personal, professional, relational, etc. We plan to discuss the work on goals during [place] session. Counselor checked in with all group members to share their plans for the afternoon and encouraged application of coping skills and self-care. Annette Molina left the group a few minutes earlier unexpectedly, therefore she was unable to complete check out.   Assessment and Plan: Counselor recommends that patient remains in IOP treatment to better manage mental health symptoms and continue to address treatment plan goals. Counselor recommends adherence to crisis/safety plan, taking medications as prescribed and following up with medical professionals if any issues arise.   Follow Up Instructions: Counselor will send Webex link for next session.    I discussed the assessment and treatment plan with the patient. The patient was provided an opportunity to ask questions and all were answered. The patient agreed with the plan and demonstrated an understanding of the instructions.   The patient was advised to call back or seek an in-person evaluation if the symptoms worsen or if the condition fails to improve as anticipated.  I provided 180 minutes of non-face-to-face time during this encounter.   Lise Auer, LCSW

## 2019-09-15 NOTE — Addendum Note (Signed)
Addended by: Derrill Center on: 09/15/2019 06:52 PM   Modules accepted: Orders

## 2019-09-16 ENCOUNTER — Other Ambulatory Visit (HOSPITAL_COMMUNITY): Payer: BC Managed Care – PPO

## 2019-09-16 ENCOUNTER — Other Ambulatory Visit: Payer: Self-pay

## 2019-09-16 ENCOUNTER — Ambulatory Visit (HOSPITAL_COMMUNITY): Payer: BC Managed Care – PPO

## 2019-09-16 ENCOUNTER — Ambulatory Visit (INDEPENDENT_AMBULATORY_CARE_PROVIDER_SITE_OTHER): Payer: BC Managed Care – PPO

## 2019-09-16 ENCOUNTER — Encounter: Payer: Self-pay | Admitting: Physician Assistant

## 2019-09-16 DIAGNOSIS — N898 Other specified noninflammatory disorders of vagina: Secondary | ICD-10-CM

## 2019-09-16 DIAGNOSIS — Z113 Encounter for screening for infections with a predominantly sexual mode of transmission: Secondary | ICD-10-CM

## 2019-09-16 NOTE — Progress Notes (Addendum)
Pt here today with c/o increased vaginal discharge.  I explained to the pt that it is not abnormal for women to have discharge.  Pt reports that she has to take several showers a day and drips out.  I explained to the pt how to obtain self swab and that we will call her with abnormal results within 24-48 hrs.  Pt verbalized understanding.   Mel Almond, RN 09/16/19   Chart reviewed for nurse visit. Agree with plan of care.   Virginia Rochester, NP 09/16/2019 4:50 PM

## 2019-09-16 NOTE — Progress Notes (Unsigned)
Virtual Visit via Video Note  I connected with Annette Molina on 09/16/19 at  9:00 AM EDT by a video enabled telemedicine application and verified that I am speaking with the correct person using two identifiers.  Location: Patient: *** Provider: ***   I discussed the limitations of evaluation and management by telemedicine and the availability of in person appointments. The patient expressed understanding and agreed to proceed.  History of Present Illness:    Observations/Objective:   Assessment and Plan:   Follow Up Instructions:    I discussed the assessment and treatment plan with the patient. The patient was provided an opportunity to ask questions and all were answered. The patient agreed with the plan and demonstrated an understanding of the instructions.   The patient was advised to call back or seek an in-person evaluation if the symptoms worsen or if the condition fails to improve as anticipated.  I provided *** minutes of non-face-to-face time during this encounter.   Lise Auer, LCSW

## 2019-09-17 ENCOUNTER — Other Ambulatory Visit (HOSPITAL_COMMUNITY): Payer: BC Managed Care – PPO | Admitting: Psychiatry

## 2019-09-17 ENCOUNTER — Telehealth: Payer: Self-pay | Admitting: Emergency Medicine

## 2019-09-17 ENCOUNTER — Encounter (HOSPITAL_COMMUNITY): Payer: Self-pay | Admitting: Psychiatry

## 2019-09-17 ENCOUNTER — Other Ambulatory Visit (HOSPITAL_COMMUNITY): Payer: BC Managed Care – PPO

## 2019-09-17 DIAGNOSIS — F3181 Bipolar II disorder: Secondary | ICD-10-CM

## 2019-09-17 NOTE — Progress Notes (Signed)
   Virtual Visit via Telephone Note  I connected with Loney [name] on 09/17/19 at  9:00 AM EDT by telephone and verified that I am speaking with the correct person using two identifiers.   I discussed the limitations, risks, security and privacy concerns of performing an evaluation and management service by telephone and the availability of in person appointments. I also discussed with the patient that there may be a patient responsible charge related to this service. The patient expressed understanding and agreed to proceed I discussed the assessment and treatment plan with the patient. The patient was provided an opportunity to ask questions and all were answered. The patient agreed with the plan and demonstrated an understanding of the instructions.   The patient was advised to call back or seek an in-person evaluation if the symptoms worsen or if the condition fails to improve as anticipated.  I provided 10 minutes of non-face-to-face time during this encounter.   Derrill Center, NP  Was reported patient having concerns with medication increase to Abilify.  NP followed up with Desma Maxim reported " I am  feeling fine" I just that the other program was going to increase it. (Patient was recently discharged from partial hospitalization.)    continues to deny suicidal or homicidal ideations.  Denies auditory or visual hallucinations.  Reported struggle with "anxiety more than anything".  States taking and tolerating medications well.  Patient encouraged to attend programming as scheduled in order to learn additional coping skills.  Patient was receptive.  Treatment plan: Continue intensive outpatient programming Abilify was refilled since at [facility].

## 2019-09-17 NOTE — Telephone Encounter (Signed)
Pt called and left a message on the nurse voicemail line stating that she was calling to get test results.   Per chart review, pt was seen on 10/21 and results still pending. Pt informed that results can take 48 hours to come back and she would be called with abnormal results. Pt also confirmed that she had a mychart account and pt was reminded that she could also check there. Pt verbalized understanding and had no further questions.

## 2019-09-18 ENCOUNTER — Emergency Department (HOSPITAL_BASED_OUTPATIENT_CLINIC_OR_DEPARTMENT_OTHER)
Admission: EM | Admit: 2019-09-18 | Discharge: 2019-09-18 | Disposition: A | Discharge status: Home / Self Care | Payer: BC Managed Care – PPO | Attending: Emergency Medicine | Admitting: Emergency Medicine

## 2019-09-18 ENCOUNTER — Encounter (HOSPITAL_BASED_OUTPATIENT_CLINIC_OR_DEPARTMENT_OTHER): Payer: Self-pay

## 2019-09-18 ENCOUNTER — Other Ambulatory Visit (HOSPITAL_COMMUNITY): Payer: BC Managed Care – PPO

## 2019-09-18 ENCOUNTER — Telehealth (HOSPITAL_COMMUNITY): Payer: Self-pay | Admitting: Psychiatry

## 2019-09-18 ENCOUNTER — Other Ambulatory Visit: Payer: Self-pay

## 2019-09-18 ENCOUNTER — Emergency Department (HOSPITAL_BASED_OUTPATIENT_CLINIC_OR_DEPARTMENT_OTHER): Payer: BC Managed Care – PPO

## 2019-09-18 ENCOUNTER — Ambulatory Visit (HOSPITAL_COMMUNITY): Payer: BC Managed Care – PPO

## 2019-09-18 ENCOUNTER — Ambulatory Visit: Payer: Self-pay | Admitting: *Deleted

## 2019-09-18 DIAGNOSIS — Y999 Unspecified external cause status: Secondary | ICD-10-CM | POA: Insufficient documentation

## 2019-09-18 DIAGNOSIS — Z79899 Other long term (current) drug therapy: Secondary | ICD-10-CM | POA: Diagnosis not present

## 2019-09-18 DIAGNOSIS — S6732XA Crushing injury of left wrist, initial encounter: Secondary | ICD-10-CM

## 2019-09-18 DIAGNOSIS — Y92810 Car as the place of occurrence of the external cause: Secondary | ICD-10-CM | POA: Insufficient documentation

## 2019-09-18 DIAGNOSIS — W231XXA Caught, crushed, jammed, or pinched between stationary objects, initial encounter: Secondary | ICD-10-CM | POA: Insufficient documentation

## 2019-09-18 DIAGNOSIS — Z888 Allergy status to other drugs, medicaments and biological substances status: Secondary | ICD-10-CM | POA: Insufficient documentation

## 2019-09-18 DIAGNOSIS — Y9389 Activity, other specified: Secondary | ICD-10-CM | POA: Diagnosis not present

## 2019-09-18 MED ORDER — ACETAMINOPHEN 325 MG PO TABS
ORAL_TABLET | ORAL | Status: AC
  Administered 2019-09-18: 14:00:00 650 mg via ORAL
  Filled 2019-09-18: qty 2

## 2019-09-18 MED ORDER — ACETAMINOPHEN 325 MG PO TABS
650.0000 mg | ORAL_TABLET | Freq: Once | ORAL | Status: AC
  Administered 2019-09-18: 14:00:00 650 mg via ORAL

## 2019-09-18 NOTE — ED Notes (Signed)
Patient returns from X-ray, states her pain is now 10/10. Resting quietly  In nad.

## 2019-09-18 NOTE — Telephone Encounter (Signed)
She is currently at the ED now

## 2019-09-18 NOTE — Telephone Encounter (Signed)
D:  Pt didn't attend MH-IOP nor did she call.  A:  Placed call to pt, but there was no answer.  Left vm about recommending discharge d/t non-compliancy with attendance and participation.  Informed pt that she could attend at a later date when she doesn't have so much on her plate.  Case manager will discuss discharging pt tomorrow with the treatment team.
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
## 2019-09-18 NOTE — Telephone Encounter (Signed)
FYI

## 2019-09-18 NOTE — ED Notes (Signed)
Patient transported to X-ray 

## 2019-09-18 NOTE — Telephone Encounter (Signed)
I do not see in the note what disposition was given -- please make sure they know to go to ER.
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
## 2019-09-19 ENCOUNTER — Ambulatory Visit (HOSPITAL_COMMUNITY): Payer: BC Managed Care – PPO

## 2019-09-19 ENCOUNTER — Other Ambulatory Visit (HOSPITAL_COMMUNITY): Payer: BC Managed Care – PPO | Admitting: Psychiatry

## 2019-09-19 ENCOUNTER — Other Ambulatory Visit (HOSPITAL_COMMUNITY): Payer: BC Managed Care – PPO

## 2019-09-19 ENCOUNTER — Telehealth (HOSPITAL_COMMUNITY): Payer: Self-pay | Admitting: Psychiatry

## 2019-09-19 ENCOUNTER — Encounter: Payer: Self-pay | Admitting: Physician Assistant

## 2019-09-19 NOTE — Telephone Encounter (Signed)
D:  Pt excused from [facility] today due to swelling of her hand from an injury the other day.  A:  Inform treatment team.

## 2019-09-22 ENCOUNTER — Other Ambulatory Visit: Payer: Self-pay

## 2019-09-22 ENCOUNTER — Encounter: Payer: Self-pay | Admitting: Physician Assistant

## 2019-09-22 ENCOUNTER — Encounter (HOSPITAL_COMMUNITY): Payer: Self-pay

## 2019-09-22 ENCOUNTER — Other Ambulatory Visit (HOSPITAL_COMMUNITY): Payer: BC Managed Care – PPO

## 2019-09-22 ENCOUNTER — Other Ambulatory Visit (HOSPITAL_COMMUNITY): Payer: BC Managed Care – PPO | Admitting: Psychiatry

## 2019-09-22 DIAGNOSIS — F3181 Bipolar II disorder: Secondary | ICD-10-CM

## 2019-09-22 DIAGNOSIS — G932 Benign intracranial hypertension: Secondary | ICD-10-CM

## 2019-09-22 NOTE — Progress Notes (Signed)
Virtual Visit via Video Note  I connected with Kaydence [name] on 09/22/19 at  9:00 AM EDT by a video enabled telemedicine application and verified that I am speaking with the correct person using two identifiers.  Location: Patient: Annette Molina Provider: Lise Auer, LCSW   I discussed the limitations of evaluation and management by telemedicine and the availability of in person appointments. The patient expressed understanding and agreed to proceed.  History of Present Illness: Bipolar 2 DO   Observations/Objective: Case Manager checked in with all participants to review discharge dates, insurance authorizations, work-related documents and needs for the treatment team. Counselor processed current mood and functioning and discussed how participants spent their time since last session and if skills were applied. [name] shared about her boundaries and relationships with family members and how they view her mental health. She is in the processes of looking for a new job as well as in waiting for disability hearing. She discussed the challenges of balancing life and mental health. [name] presented with moderate depression and moderate anxiety.  Counselor presented information on Restaurant manager, fast food for [employer]. Group members asked questions, engaged in discussion, shared ideas and strategies and identified ways the could apply the skills to their lives. [name] contributed well to the conversation, asking for help in certain areas while also sharing what she knows to be helpful for herself in this area.   Counselor wrapped up the session by having each group member shared their plans for the afternoon. [name] plans to organize her bedroom today, go for a long drive and continue looking for jobs.    Assessment and Plan: Counselor recommends that patient remains in IOP treatment to better manage mental health symptoms and continue to address treatment plan  goals. Counselor recommends adherence to crisis/safety plan, taking medications as prescribed and following up with medical professionals if any issues arise.   Follow Up Instructions: Counselor will send Webex link for next session.    I discussed the assessment and treatment plan with the patient. The patient was provided an opportunity to ask questions and all were answered. The patient agreed with the plan and demonstrated an understanding of the instructions.   The patient was advised to call back or seek an in-person evaluation if the symptoms worsen or if the condition fails to improve as anticipated.  I provided 180 minutes of non-face-to-face time during this encounter.   Lise Auer, LCSW

## 2019-09-23 ENCOUNTER — Other Ambulatory Visit: Payer: Self-pay

## 2019-09-23 ENCOUNTER — Other Ambulatory Visit (HOSPITAL_COMMUNITY): Payer: BC Managed Care – PPO

## 2019-09-23 ENCOUNTER — Ambulatory Visit (HOSPITAL_COMMUNITY): Payer: BC Managed Care – PPO

## 2019-09-23 ENCOUNTER — Encounter (HOSPITAL_COMMUNITY): Payer: Self-pay

## 2019-09-23 ENCOUNTER — Other Ambulatory Visit (HOSPITAL_COMMUNITY): Payer: BC Managed Care – PPO | Admitting: Psychiatry

## 2019-09-23 DIAGNOSIS — F3181 Bipolar II disorder: Secondary | ICD-10-CM | POA: Diagnosis not present

## 2019-09-23 NOTE — Progress Notes (Signed)
Virtual Visit via Video Note  I connected with Annette Molina on 09/23/19 at  9:00 AM EDT by a video enabled telemedicine application and verified that I am speaking with the correct person using two identifiers.  Location: Patient: Annette Molina Provider: Lise Auer, LCSW   I discussed the limitations of evaluation and management by telemedicine and the availability of in person appointments. The patient expressed understanding and agreed to proceed.  History of Present Illness: Bipolar 2 DO   Observations/Objective: Case Manager checked in with all participants to review discharge dates, insurance authorizations, work-related documents and needs for the treatment team. Counselor processed current mood and functioning and discussed how participants spent their time since last session and if skills were applied. Annette Molina shared that she had a relaxed afternoon and was feeling good today. She shared about her online classes and how it impacts her mental health. She was distressed about access to medications and will be communicating with her provider. Annette Molina presented with mild depression and high anxiety.   Counselor continued presentation of information on Restaurant manager, fast food for Better Mental Health: Optimizing Your [employer], [employer] and [employer]. Group members asked questions, engaged in discussion, shared ideas and strategies and identified ways they could apply the skills to their lives. Annette Molina provided some feedback on how she would implement strategies in creating a better routine for herself, describing her challenges.   Annette Molina left the group early as she had an online class and a doctors appointment scheduled during the end of group. Counselor continues to reiterate the importance in her full participation in the program.   Assessment and Plan: Counselor recommends that patient remains in IOP treatment to better manage mental health symptoms and continue to address  treatment plan goals. Counselor recommends adherence to crisis/safety plan, taking medications as prescribed and following up with medical professionals if any issues arise.   Follow Up Instructions: Counselor will send Webex link for next session.    I discussed the assessment and treatment plan with the patient. The patient was provided an opportunity to ask questions and all were answered. The patient agreed with the plan and demonstrated an understanding of the instructions.   The patient was advised to call back or seek an in-person evaluation if the symptoms worsen or if the condition fails to improve as anticipated.  I provided 180 minutes of non-face-to-face time during this encounter.   Lise Auer, LCSW

## 2019-09-24 ENCOUNTER — Other Ambulatory Visit: Payer: Self-pay

## 2019-09-24 ENCOUNTER — Other Ambulatory Visit (HOSPITAL_COMMUNITY): Payer: BC Managed Care – PPO

## 2019-09-24 ENCOUNTER — Other Ambulatory Visit (HOSPITAL_COMMUNITY): Payer: BC Managed Care – PPO | Admitting: Psychiatry

## 2019-09-24 ENCOUNTER — Encounter (HOSPITAL_COMMUNITY): Payer: Self-pay

## 2019-09-24 DIAGNOSIS — F3181 Bipolar II disorder: Secondary | ICD-10-CM

## 2019-09-24 LAB — CERVICOVAGINAL ANCILLARY ONLY
Bacterial Vaginitis (gardnerella): NEGATIVE
Candida Glabrata: NEGATIVE
Candida Vaginitis: NEGATIVE
Chlamydia: NEGATIVE
Comment: NEGATIVE
Comment: NEGATIVE
Comment: NEGATIVE
Comment: NEGATIVE
Comment: NEGATIVE
Comment: NORMAL
Neisseria Gonorrhea: NEGATIVE
Trichomonas: NEGATIVE

## 2019-09-24 NOTE — Progress Notes (Signed)
Virtual Visit via Video Note  I connected with Annette Molina on 09/24/19 at  9:00 AM EDT by a video enabled telemedicine application and verified that I am speaking with the correct person using two identifiers.  Location: Patient: Annette Molina Provider: Lise Auer, LCSW   I discussed the limitations of evaluation and management by telemedicine and the availability of in person appointments. The patient expressed understanding and agreed to proceed.  History of Present Illness: Bipolar 2 DO   Observations/Objective: Case Manager checked in with all participants to review discharge dates, insurance authorizations, work-related documents and needs for the treatment team. Counselor processed current mood and functioning and discussed how participants spent their time since last session and if skills were applied. Annette Molina shared that she had family come into town to visit, explaining that she experienced excitement and it was stressful. She reported on her recent doctors appointments, having positive outcomes. She stated that they are recommending that she participate in Anger Management. Counselor shared local resources. Annette Molina presented with moderate depression and anxiety.   Counselor engaged group members in an ice breaker, since we have a new group member, to list, then share their favorite aspects of Fall. Group members shared their preferences and we connected it to grounding activities presented in the past. Around this time Annette Molina shared that she was experiencing an extreme headache and she asked to be excused early.    Assessment and Plan: Counselor recommends that patient remains in IOP treatment to better manage mental health symptoms and continue to address treatment plan goals. Counselor recommends adherence to crisis/safety plan, taking medications as prescribed and following up with medical professionals if any issues arise.   Follow Up Instructions: Counselor will  send Webex link for next session.    I discussed the assessment and treatment plan with the patient. The patient was provided an opportunity to ask questions and all were answered. The patient agreed with the plan and demonstrated an understanding of the instructions.   The patient was advised to call back or seek an in-person evaluation if the symptoms worsen or if the condition fails to improve as anticipated.  I provided 90 minutes of non-face-to-face time during this encounter.   Lise Auer, LCSW

## 2019-09-25 ENCOUNTER — Other Ambulatory Visit: Payer: Self-pay

## 2019-09-25 ENCOUNTER — Other Ambulatory Visit (HOSPITAL_COMMUNITY): Payer: BC Managed Care – PPO

## 2019-09-25 ENCOUNTER — Ambulatory Visit (HOSPITAL_COMMUNITY): Payer: BC Managed Care – PPO

## 2019-09-25 ENCOUNTER — Other Ambulatory Visit (HOSPITAL_COMMUNITY): Payer: BC Managed Care – PPO | Admitting: Psychiatry

## 2019-09-25 ENCOUNTER — Encounter (HOSPITAL_COMMUNITY): Payer: Self-pay | Admitting: Psychiatry

## 2019-09-25 DIAGNOSIS — F3181 Bipolar II disorder: Secondary | ICD-10-CM
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
## 2019-09-26 ENCOUNTER — Encounter (HOSPITAL_COMMUNITY): Payer: Self-pay | Admitting: Family

## 2019-09-26 ENCOUNTER — Other Ambulatory Visit: Payer: Self-pay

## 2019-09-26 ENCOUNTER — Ambulatory Visit (HOSPITAL_COMMUNITY): Payer: BC Managed Care – PPO

## 2019-09-26 ENCOUNTER — Other Ambulatory Visit (HOSPITAL_COMMUNITY): Payer: BC Managed Care – PPO | Admitting: Psychiatry

## 2019-09-26 ENCOUNTER — Other Ambulatory Visit (HOSPITAL_COMMUNITY): Payer: BC Managed Care – PPO

## 2019-09-26 DIAGNOSIS — F902 Attention-deficit hyperactivity disorder, combined type: Secondary | ICD-10-CM

## 2019-09-26 DIAGNOSIS — F3181 Bipolar II disorder: Secondary | ICD-10-CM | POA: Diagnosis not present
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
## 2019-09-26 NOTE — Patient Instructions (Signed)
D:  Patient completed MH-IOP today.  A:  Discharge today.  Follow up with Dr. Montel Culver on 09-30-19 @ 4pm and Binnie Rail, LCAS on 10-02-19 @ 1pm.  Encouraged support groups through [facility].  R:  Patient receptive.
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
## 2019-09-26 NOTE — Progress Notes (Signed)
Virtual Visit via Telephone Note  I connected with Annette Molina on 09/26/19 at  9:00 AM EDT by telephone and verified that I am speaking with the correct person using two identifiers.   I discussed the limitations, risks, security and privacy concerns of performing an evaluation and management service by telephone and the availability of in person appointments. I also discussed with the patient that there may be a patient responsible charge related to this service. The patient expressed understanding and agreed to proceed.   I discussed the assessment and treatment plan with the patient. The patient was provided an opportunity to ask questions and all were answered. The patient agreed with the plan and demonstrated an understanding of the instructions.   The patient was advised to call back or seek an in-person evaluation if the symptoms worsen or if the condition fails to improve as anticipated.  I provided 15 minutes of non-face-to-face time during this encounter.   Derrill Center, NP   [facility]ensive [facility] Program Discharge Summary  Annette Molina [id number]  Admission date: 09/10/2019 Discharge date: 09/26/2019  Reason for admission: Per assessment note: Annette " [name]" Molina 23 year old African-American female presents with worsening depression, mood irritability and racing thoughts.  She reports more recently she has been struggling with low self esteem and feelings of passive suicidal ideation. Patient is currently denying suicidal ideations plan or intent.  Reports she is struggling with a new diagnosis of bipolar disorder.  States " is too much to process."  Reports she is followed by a psychiatrist who recently prescribed Latuda and Abilify.  Chart reviewed patient has diagnosis with  ADHD combined; Bipolar 2 disorder rapid cycling; Panic disorder States she does not feel as if the medication is helping with her mood.  Patient reports she she  is currently followed by psychiatry  and a therapist.  Reported she may need to consider group sessions for anger management issues.  During this assessment patient is currently denying suicidal or homicidal ideations.  Denies auditory or visual hallucinations.  Patient to  start intensive outpatient programming.   Family of Origin Issues: Denied  Progress in Program Toward Treatment Goals: Annette Molina attended and participated sporadically.  Patient appeared to need constant redirection with logging onto daily group sessions.  She denies suicidal or homicidal ideations.  Reported she has not been able to pick up medications as of yet however has plans to follow-up with [facility] later this evening.  Annette Molina reported she enjoyed learning new skills and is hopeful to continue working on "things "that she has learned during daily sessions.  Patient to keep follow-up appointments.  Support, encouragement and reassurance was provided  Progress (rationale):Keep Follow-up with P[place]owski 09/30/2019 @ 16:00 and Binnie Rail, LCAS 10/02/2019@ 13:00pm   Take all medications as prescribed. Keep all follow-up appointments as scheduled.  Do not consume alcohol or use illegal drugs while on prescription medications. Report any adverse effects from your medications to your primary care provider promptly.  In the event of recurrent symptoms or worsening symptoms, call 911, a crisis hotline, or go to the nearest emergency department for evaluation.    Derrill Center, NP 09/26/2019

## 2019-09-29 ENCOUNTER — Other Ambulatory Visit (HOSPITAL_COMMUNITY): Payer: BC Managed Care – PPO

## 2019-09-30 ENCOUNTER — Ambulatory Visit (INDEPENDENT_AMBULATORY_CARE_PROVIDER_SITE_OTHER): Payer: BC Managed Care – PPO | Admitting: Psychiatry

## 2019-09-30 ENCOUNTER — Other Ambulatory Visit (HOSPITAL_COMMUNITY): Payer: BC Managed Care – PPO

## 2019-09-30 ENCOUNTER — Other Ambulatory Visit: Payer: Self-pay

## 2019-09-30 DIAGNOSIS — F410 Panic disorder [episodic paroxysmal anxiety] without agoraphobia: Secondary | ICD-10-CM | POA: Diagnosis not present

## 2019-09-30 DIAGNOSIS — F902 Attention-deficit hyperactivity disorder, combined type: Secondary | ICD-10-CM | POA: Diagnosis not present

## 2019-09-30 DIAGNOSIS — F3181 Bipolar II disorder: Secondary | ICD-10-CM | POA: Diagnosis not present

## 2019-09-30 MED ORDER — ARIPIPRAZOLE 10 MG PO TABS: 10.0000 mg | ORAL_TABLET | Freq: Every day | ORAL | 0 refills | Status: DC

## 2019-09-30 MED ORDER — CLONAZEPAM 1 MG PO TBDP: 1.0000 mg | ORAL_TABLET | Freq: Two times a day (BID) | ORAL | 1 refills | Status: DC | PRN

## 2019-09-30 NOTE — Progress Notes (Signed)
BH MD/PA/NP OP Progress Note  09/30/2019 4:19 PM Annette Molina  MRN:  [id number] Interview was conducted by phone and I verified that I was speaking with the correct person using two identifiers. I discussed the limitations of evaluation and management by telemedicine and  the availability of in person appointments. Patient expressed understanding and agreed to proceed.  Chief Complaint: Anxiety, lack of focus.  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 She also has frequent panic attacks with no identifiable triggers. She reports that her mood swings occur multiple times within a day and are not in response to environmental triggers/stressors. She denies feeling hopeless or suicidal but admits once having passive suicidal over 5 years ago. She did not go to the hospital then and has no hx of inpatient psychiatric admissions. She has been diagnosed with ADHD at age 30  And was on Adderall and Strattera in the past. She found the former quite helpful. She was diagnosed with bipolar 2 disorder and has been recently under care of Annette Molina  [facility]) who has tried her on Latude then Abilify which she still takes at 10 mg daily. She became more depressed with passive SI in early October and entered [facility] on 10/14 (completed 10/30?Marland Kitchen She reports feeling better i.e. no longer suicidal and being better able to control anxiety. She could not for unclear reasons fill Rx for clonazepam and Adderall which I ordered last month. Problems with focusing are therefore still present. Annette Molina has not been ever tried on any mood stabilizers (lithium, divalproate, carbamazepine, lamotrigine) but was briefly on quetiapine which she also found ineffective (only 50 mg though). She has no hx of alcohol or dug abuse.  Visit Diagnosis:    ICD-10-CM   1. Bipolar 2 disorder,  major depressive episode (HCC)  F31.81   2. Attention deficit hyperactivity disorder (ADHD), combined type  F90.2   3. Panic disorder  F41.0     Past Psychiatric History: Please see intake H&P.  Past Medical History:  Past Medical History:  Diagnosis Date  . ADHD (attention deficit hyperactivity disorder)   . Anxiety   . Depression   . Precocious puberty    Was followed by endocrinology.    Past Surgical History:  Procedure Laterality Date  . NO PAST SURGERIES      Family Psychiatric History: None.  Family History:  Family History  Problem Relation Age of Onset  . Hypertension Mother   . Diabetes Father   . Heart disease Father   . Cancer Neg Hx     Social History:  Social History   Socioeconomic History  . Marital status: Single    Spouse name: Not on file  . Number of children: 0  . Years of education: Not on file  . Highest education level: Not on file  Occupational History  . 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 Financial resource strain: Not on file  . Food insecurity    Worry: Not on file    Inability: Not on file  . Transportation needs    Medical: Not on file    Non-medical: Not on file  Tobacco Use  . Smoking status: Never Smoker  . Smokeless tobacco: Never Used  Substance and Sexual Activity  . Alcohol use: No    Alcohol/week: 0.0 standard drinks  . Drug use:  No  . Sexual activity: Not on file  Lifestyle  . Physical activity    Days per week: Not on file    Minutes per session: Not on file  . Stress: Not on file  Relationships  . Social Musicianconnections    Talks on phone: Not on file    Gets together: Not on file    Attends religious service: Not on file    Active member of club or organization: Not on file    Attends meetings of clubs or organizations: Not on file    Relationship status: Not on file  Other Topics Concern  . Not on file  Social History Narrative   Caffeine Use:  3 daily   Regular Exercise:   2-3 x weekly   Lives with Mom step dad and brother   Attends [employer].   Patient is the granddaughter of Annette Molina          Allergies:  Allergies  Allergen Reactions  . Lupron [Leuprolide Acetate]     Was allergic to a preservative in the Lupron.    Metabolic Disorder Labs: Lab Results  Component Value Date   HGBA1C 6.4 08/26/2018   No results found for: PROLACTIN Lab Results  Component Value Date   CHOL 171 08/26/2018   TRIG 146.0 08/26/2018   HDL 36.90 (L) 08/26/2018   CHOLHDL 5 08/26/2018   VLDL 29.2 08/26/2018   LDLCALC 105 (H) 08/26/2018   Lab Results  Component Value Date   TSH 1.97 01/21/2019   TSH 1.53 02/15/2016    Therapeutic Level Labs: No results found for: LITHIUM No results found for: VALPROATE No components found for:  CBMZ  Current Medications: Current Outpatient Medications  Medication Sig Dispense Refill  . amphetamine-dextroamphetamine (ADDERALL) 10 MG tablet Take 1 tablet (10 mg total) by mouth 2 (two) times daily with a meal. 60 tablet 0  . amphetamine-dextroamphetamine (ADDERALL) 10 MG tablet Take 1 tablet (10 mg total) by mouth 2 (two) times daily with a meal. 60 tablet 0  . [START ON 10/15/2019] ARIPiprazole (ABILIFY) 10 MG tablet Take 1 tablet (10 mg total) by mouth at bedtime. 30 tablet 0  . clonazePAM (KLONOPIN) 1 MG disintegrating tablet Take 1 tablet (1 mg total) by mouth 2 (two) times daily as needed (panic attacks). 60 tablet 1   No current facility-administered medications for this visit.      Psychiatric Specialty Exam: Review of Systems  Psychiatric/Behavioral: Positive for depression. The patient is nervous/anxious.   All other systems reviewed and are negative.   Last menstrual period 09/12/2019.There is no height or weight on file to calculate BMI.  General Appearance: NA  Eye Contact:  NA  Speech:  Clear and Coherent and Normal Rate  Volume:  Normal  Mood:  Anxious, Depressed and Dysphoric  Affect:  NA  Thought  Process:  Goal Directed and Linear  Orientation:  Full (Time, Place, and Person)  Thought Content: Logical   Suicidal Thoughts:  No  Homicidal Thoughts:  No  Memory:  Immediate;   Good Recent;   Good Remote;   Good  Judgement:  Fair  Insight:  Fair  Psychomotor Activity:  NA  Concentration:  Concentration: Poor  Recall:  Good  Fund of Knowledge: Good  Language: Good  Akathisia:  Negative  Handed:  Right  AIMS (if indicated): not done  Assets:  Communication Skills Desire for Improvement Financial Resources/Insurance Housing Social Support  ADL's:  Intact  Cognition: WNL  Sleep:  Good  Screenings: GAD-7     Office Visit from 04/29/2019 in [facility]  Total GAD-7 Score  14    PHQ2-9     Office Visit from 04/29/2019 in [facility] Office Visit from 08/26/2018 in [facility] Office Visit from 11/10/2016 in [facility]  PHQ-2 Total Score  4  0  4  PHQ-9 Total Score  13  1  10        Assessment and Plan: 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 She also has frequent panic attacks with no identifiable triggers. She reports that her mood swings occur multiple times within a day and are not in response to environmental triggers/stressors. She denies feeling hopeless or suicidal but admits once having passive suicidal over 5 years ago. She did not go to the hospital then and has no hx of inpatient psychiatric admissions. She has been diagnosed with ADHD at age 8  And was on Adderall and Strattera in the past. She found the former quite helpful. She was diagnosed with bipolar 2 disorder and has been recently under care of [facility]  [facility]) who has tried her on Latude then Abilify which she  still takes at 10 mg daily. She became more depressed with passive SI in early October and entered [facility] on 10/14 (completed 10/30?Marland Kitchen She reports feeling better i.e. no longer suicidal and being better able to control anxiety. She could not for unclear reasons fill Rx for clonazepam and Adderall which I ordered last month. Problems with focusing are therefore still present. [name] has not been ever tried on any mood stabilizers (lithium, divalproate, carbamazepine, lamotrigine) but was briefly on quetiapine which she also found ineffective (only 50 mg though). She has no hx of alcohol or dug abuse.  Dx: Bipolar 2 disorder rapid cycling; Panic disorder; ADHD combined  Plan: We will continue Abilify 10 mg at HS. I will again order Adderall IR 10 mg bid and  clonazepam ODT 1 mg bid prn panic attacks. Next appointment in one month. The plan was discussed with patient who had an opportunity to ask questions and these were all answered. I spend 25 minutes in phone consultation with the patient.    Stephanie Acre, MD 09/30/2019, 4:19 PM

## 2019-10-02 ENCOUNTER — Other Ambulatory Visit: Payer: Self-pay

## 2019-10-02 ENCOUNTER — Ambulatory Visit (HOSPITAL_COMMUNITY): Payer: BC Managed Care – PPO | Admitting: Licensed Clinical Social Worker

## 2019-10-18 ENCOUNTER — Encounter: Payer: Self-pay | Admitting: Physician Assistant

## 2019-10-21 ENCOUNTER — Other Ambulatory Visit: Payer: Self-pay

## 2019-10-21 ENCOUNTER — Encounter (HOSPITAL_COMMUNITY): Payer: Self-pay | Admitting: Licensed Clinical Social Worker

## 2019-10-21 ENCOUNTER — Ambulatory Visit (INDEPENDENT_AMBULATORY_CARE_PROVIDER_SITE_OTHER): Payer: BC Managed Care – PPO | Admitting: Licensed Clinical Social Worker

## 2019-10-21 DIAGNOSIS — F410 Panic disorder [episodic paroxysmal anxiety] without agoraphobia: Secondary | ICD-10-CM | POA: Diagnosis not present

## 2019-10-21 DIAGNOSIS — F3181 Bipolar II disorder: Secondary | ICD-10-CM | POA: Diagnosis not present

## 2019-10-21 NOTE — Progress Notes (Signed)
Virtual Visit via Phone Note  I connected with Annette Molina on 10/21/19 at 4:20pm EST by a video enabled telemedicine application and verified that I am speaking with the correct person using two identifiers.   I discussed the limitations of evaluation and management by telemedicine and the availability of in person appointments. The patient expressed understanding and agreed to proceed.  History of Present Illness: Patient is referred for individual therapy for Bipolar 2, major depressive episode, panic attack, after completion of IOP. She sees Dr. Mamie Nick, psychiatrist, for medication management.    Observations/Objective: Patient presented late and depressed for her phone telemedicine session. Patient discussed her psychiatric symptoms and current life events. Spent a considerable amount of time building a trusting, therapeutic relationship and gathering background information. Patient reports she feels she is depressed and continues with 3-4 panic attacks per day.  Discussed coping skills. Patient still lives with her parents and is currently unemployed. She hurt her hip while at work and now is in the process of filing for LTD. She is also applying for SSDI. Discussed this with patient. 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 She still takes care of her grandmother daily. Made new appointment.  Assessment and Plan: Review treatment plan at next session and goals for individual therapy.   Follow Up Instructions:    I discussed the assessment and treatment plan with the patient. The patient was provided an opportunity to ask questions and all were answered. The patient agreed with the plan and demonstrated an understanding of the instructions.   The patient was advised to call back or seek an in-person evaluation if the symptoms worsen or if the condition fails to improve as anticipated.  I provided 25 minutes of non-face-to-face time during this encounter.   MACKENZIE,LISBETH  S, LCAS

## 2019-11-03 ENCOUNTER — Other Ambulatory Visit: Payer: Self-pay

## 2019-11-03 ENCOUNTER — Ambulatory Visit (INDEPENDENT_AMBULATORY_CARE_PROVIDER_SITE_OTHER): Payer: BC Managed Care – PPO | Admitting: Licensed Clinical Social Worker

## 2019-11-03 ENCOUNTER — Encounter (HOSPITAL_COMMUNITY): Payer: Self-pay | Admitting: Licensed Clinical Social Worker

## 2019-11-03 DIAGNOSIS — F3181 Bipolar II disorder: Secondary | ICD-10-CM | POA: Diagnosis not present

## 2019-11-03 NOTE — Progress Notes (Signed)
Virtual Visit via Phone Note  I connected with Annette Molina on 11/03/19 at 2:00pm EST by a video enabled telemedicine application and verified that I am speaking with the correct person using two identifiers.   I discussed the limitations of evaluation and management by telemedicine and the availability of in person appointments. The patient expressed understanding and agreed to proceed.  History of Present Illness: Patient is referred for individual therapy for Bipolar 2, major depressive episode, panic attack, after completion of IOP. She sees Dr. Mamie Nick, psychiatrist, for medication management.    Observations/Objective: Patient presented anxious for her phone telemedicine session. Patient discussed her psychiatric symptoms and current life events. Patient reports her moods have stabilized since her last session. At last session she was visiting relaiives, where she encountered a lot of drama and negativity. Discussed what coping skills she used and what worked. After patient distanced herself from the situation she was able to become more calm. Discussed coping skills learned in IOP. Emailed patient mindfulness skills, grounding skills and TIPP worksheet. Reviewed briefly with patient and will continue discussion at next session. Patient wants to have sessions weekly, scheduled weekly sessions.  PLAN: review worksheets, panera interview, babysitting  Assessment and Plan: Review treatment plan at next session and goals for individual therapy.   Follow Up Instructions:    I discussed the assessment and treatment plan with the patient. The patient was provided an opportunity to ask questions and all were answered. The patient agreed with the plan and demonstrated an understanding of the instructions.   The patient was advised to call back or seek an in-person evaluation if the symptoms worsen or if the condition fails to improve as anticipated.  I provided 40 minutes of non-face-to-face  time during this encounter.   Denesha Brouse S, LCAS  +

## 2019-11-04 ENCOUNTER — Ambulatory Visit (INDEPENDENT_AMBULATORY_CARE_PROVIDER_SITE_OTHER): Payer: BC Managed Care – PPO | Admitting: Psychiatry

## 2019-11-04 ENCOUNTER — Other Ambulatory Visit: Payer: Self-pay

## 2019-11-04 DIAGNOSIS — F902 Attention-deficit hyperactivity disorder, combined type: Secondary | ICD-10-CM

## 2019-11-04 DIAGNOSIS — F410 Panic disorder [episodic paroxysmal anxiety] without agoraphobia: Secondary | ICD-10-CM | POA: Diagnosis not present

## 2019-11-04 DIAGNOSIS — F3181 Bipolar II disorder: Secondary | ICD-10-CM

## 2019-11-04 MED ORDER — AMPHETAMINE-DEXTROAMPHETAMINE 10 MG PO TABS: 10.0000 mg | ORAL_TABLET | Freq: Two times a day (BID) | ORAL | 0 refills | Status: DC

## 2019-11-04 MED ORDER — ARIPIPRAZOLE 10 MG PO TABS: 10.0000 mg | ORAL_TABLET | Freq: Every day | ORAL | 2 refills | Status: DC

## 2019-11-04 NOTE — Progress Notes (Signed)
BH MD/PA/NP OP Progress Note  11/04/2019 3:43 PM Annette Molina  MRN:  [id number] Interview was conducted by phone and I verified that I was speaking with the correct person using two identifiers. I discussed the limitations of evaluation and management by telemedicine and  the availability of in person appointments. Patient expressed understanding and agreed to proceed.  Chief Complaint: Anxiety.  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 She also has frequent panic attacks with no identifiable triggers. She reports that her mood swings occur multiple times within a day and are not in response to environmental triggers/stressors. She denies feeling hopeless or suicidal. She has no hx of inpatient psychiatric admissions. She has been diagnosed with ADHD at age 69 and was on Adderall and Strattera in the past. She found the former much more helpful. She was diagnosed with bipolar 2 disorder and has been recently under care of Dara Hoyer [facility]) who has tried her on [place] then Abilify which she still takes at 10 mg daily. She became more depressed with passive SI in early October and entered [facility] (completed 10/30?Marland Kitchen She reports feeling better i.e. no longer suicidal and being better able to control anxiety. She still has not filled Rx for clonazepam claiming she did not know what it is for (we discussed it at last two visits). Adderall was restarted. Annette Molina has not been ever tried on any mood stabilizers (lithium, divalproate, carbamazepine, lamotrigine) but was briefly on quetiapine which she also found ineffective (only 50 mg though). She has no hx of alcohol or dug abuse.  Visit Diagnosis:    ICD-10-CM   1. Panic disorder  F41.0   2. Attention deficit hyperactivity disorder (ADHD), combined type  F90.2   3. Bipolar 2 disorder (HCC)   F31.81     Past Psychiatric History: Please see intake H&P.  Past Medical History:  Past Medical History:  Diagnosis Date  . ADHD (attention deficit hyperactivity disorder)   . Anxiety   . Depression   . Precocious puberty    Was followed by endocrinology.    Past Surgical History:  Procedure Laterality Date  . NO PAST SURGERIES      Family Psychiatric History: None.  Family History:  Family History  Problem Relation Age of Onset  . Hypertension Mother   . Diabetes Father   . Heart disease Father   . Cancer Neg Hx     Social History:  Social History   Socioeconomic History  . Marital status: Single    Spouse name: Not on file  . Number of children: 0  . Years of education: Not on file  . Highest education level: Not on file  Occupational History  . 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 Financial resource strain: Not on file  . Food insecurity    Worry: Not on file    Inability: Not on file  . Transportation needs    Medical: Not on file    Non-medical: Not on file  Tobacco Use  . Smoking status: Never Smoker  . Smokeless tobacco: Never Used  Substance and Sexual Activity  . Alcohol use: No    Alcohol/week: 0.0 standard drinks  . Drug use: No  . Sexual activity: Not on file  Lifestyle  . Physical activity    Days per week: Not on  file    Minutes per session: Not on file  . Stress: Not on file  Relationships  . Social Herbalist on phone: Not on file    Gets together: Not on file    Attends religious service: Not on file    Active member of club or organization: Not on file    Attends meetings of clubs or organizations: Not on file    Relationship status: Not on file  Other Topics Concern  . Not on file  Social History Narrative   Caffeine Use:  3 daily   Regular Exercise:  2-3 x weekly   Lives with Mom step dad and brother   Attends [employer].   Patient is the granddaughter of Juanetta Gosling           Allergies:  Allergies  Allergen Reactions  . Lupron [Leuprolide Acetate]     Was allergic to a preservative in the Lupron.    Metabolic Disorder Labs: Lab Results  Component Value Date   HGBA1C 6.4 08/26/2018   No results found for: PROLACTIN Lab Results  Component Value Date   CHOL 171 08/26/2018   TRIG 146.0 08/26/2018   HDL 36.90 (L) 08/26/2018   CHOLHDL 5 08/26/2018   VLDL 29.2 08/26/2018   LDLCALC 105 (H) 08/26/2018   Lab Results  Component Value Date   TSH 1.97 01/21/2019   TSH 1.53 02/15/2016    Therapeutic Level Labs: No results found for: LITHIUM No results found for: VALPROATE No components found for:  CBMZ  Current Medications: Current Outpatient Medications  Medication Sig Dispense Refill  . amphetamine-dextroamphetamine (ADDERALL) 10 MG tablet Take 1 tablet (10 mg total) by mouth 2 (two) times daily with a meal. 60 tablet 0  . amphetamine-dextroamphetamine (ADDERALL) 10 MG tablet Take 1 tablet (10 mg total) by mouth 2 (two) times daily with a meal. 60 tablet 0  . ARIPiprazole (ABILIFY) 10 MG tablet Take 1 tablet (10 mg total) by mouth at bedtime. 30 tablet 2  . clonazePAM (KLONOPIN) 1 MG disintegrating tablet Take 1 tablet (1 mg total) by mouth 2 (two) times daily as needed (panic attacks). 60 tablet 1   No current facility-administered medications for this visit.       Psychiatric Specialty Exam: Review of Systems  Psychiatric/Behavioral: The patient is nervous/anxious.   All other systems reviewed and are negative.   There were no vitals taken for this visit.There is no height or weight on file to calculate BMI.  General Appearance: NA  Eye Contact:  NA  Speech:  Clear and Coherent and Normal Rate  Volume:  Normal  Mood:  Anxious  Affect:  NA  Thought Process:  Descriptions of Associations: Circumstantial  Orientation:  Full (Time, Place, and Person)  Thought Content: Logical   Suicidal Thoughts:  No  Homicidal Thoughts:  No  Memory:   Immediate;   Fair Recent;   Fair Remote;   Good  Judgement:  Good  Insight:  Fair  Psychomotor Activity:  NA  Concentration:  Concentration: Fair  Recall:  [place] of Knowledge: Fair  Language: Good  Akathisia:  Negative  Handed:  Right  AIMS (if indicated): not done  Assets:  Desire for Improvement Financial Resources/Insurance Housing Resilience Social Support  ADL's:  Intact  Cognition: WNL  Sleep:  Good   Screenings: GAD-7     Office Visit from 04/29/2019 in [place]  Total GAD-7 Score  14  PHQ2-9     [facility]  PHQ-2 Total Score  4  0  4  PHQ-9 Total Score  13  1  10        Assessment and Plan: 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 She also has frequent panic attacks with no identifiable triggers. She reports that her mood swings occur multiple times within a day and are not in response to environmental triggers/stressors. She denies feeling hopeless or suicidal. She has no hx of inpatient psychiatric admissions. She has been diagnosed with ADHD at age 23 and was on Adderall and Strattera in the past. She found the former much more helpful. She was diagnosed with bipolar 2 disorder and has been recently under care [facility] who has tried her on [place] then Abilify which she still takes at 10 mg daily. She became more depressed with passive SI in early October and entered [facility] (completed 10/30?Marland Kitchen. She reports feeling better i.e. no longer suicidal and being better able to control anxiety. She still has not filled Rx for  clonazepam claiming she did not know what it is for (we discussed it at last two visits). Adderall was restarted. Annette LefevreJessy has not been ever tried on any mood stabilizers (lithium, divalproate, carbamazepine, lamotrigine) but was briefly on quetiapine which she also found ineffective (only 50 mg though). She has no hx of alcohol or dug abuse.  Dx: Bipolar 2 disorder rapid cycling; Panic disorder; ADHD combined  Plan: We will continue Abilify 10 mg at HS, Adderall IR 10 mg bid and again encouraged to try clonazepam ODT 1 mg bid prn panic attacks. Next appointment in one month. The plan was discussed with patient who had an opportunity to ask questions and these were all answered. I spend5525minutes in phone consultation with the patient.    Magdalene Patricialgierd A Trystian Crisanto, MD 11/04/2019, 3:43 PM

## 2019-11-10 ENCOUNTER — Ambulatory Visit (INDEPENDENT_AMBULATORY_CARE_PROVIDER_SITE_OTHER): Payer: BC Managed Care – PPO | Admitting: Licensed Clinical Social Worker

## 2019-11-10 ENCOUNTER — Encounter (HOSPITAL_COMMUNITY): Payer: Self-pay | Admitting: Licensed Clinical Social Worker

## 2019-11-10 ENCOUNTER — Other Ambulatory Visit: Payer: Self-pay

## 2019-11-10 DIAGNOSIS — F3181 Bipolar II disorder: Secondary | ICD-10-CM | POA: Diagnosis not present

## 2019-11-10 DIAGNOSIS — F410 Panic disorder [episodic paroxysmal anxiety] without agoraphobia: Secondary | ICD-10-CM

## 2019-11-10 NOTE — Progress Notes (Signed)
Virtual Visit via Phone Note  I connected with Annette Molina on 11/10/19 at 3:00pm EST by a video enabled telemedicine application and verified that I am speaking with the correct person using two identifiers.   I discussed the limitations of evaluation and management by telemedicine and the availability of in person appointments. The patient expressed understanding and agreed to proceed.  History of Present Illness: Patient is referred for individual therapy for Bipolar 2, major depressive episode, panic attack, after completion of IOP. She sees Dr. Mamie Nick, psychiatrist, for medication management.    Observations/Objective: Patient presented anxious for video-enabled phone telemedicine session. Patient discussed her psychiatric symptoms and current life events. Patient reports her moods continue to be stable. Patient had a session with Dr. Mamie Nick, psychiatrist, who  Kept her medications the same and encouraged patient to pick up her Clonazepam medication from the pharmacist. Discussed her medication and the necessity of each medication. Patient reports she is now working at [employer], likes it and doesn't feel it is a stressor. Patient is interested in losing weight and asked for help. Counselor reviewed her chart and her PCP had referred her to [facility], however she has not received a call from them. Gave patient # and suggested she call to make an appointment. Patient had printed Public relations account executive. Began discussion of each and will continue at next session.  PLAN: TX PLAN   PLAN: review worksheets  Assessment and Plan: Counselor will continue to meet with patient to address treatment plan goals. Patient will continue to follow recommendations of providers and implement skills learned in session.    Follow Up Instructions:    I discussed the assessment and treatment plan with the patient. The patient was provided an opportunity to ask questions and all  were answered. The patient agreed with the plan and demonstrated an understanding of the instructions.   The patient was advised to call back or seek an in-person evaluation if the symptoms worsen or if the condition fails to improve as anticipated.  I provided 25 minutes of non-face-to-face time during this encounter.   Adeeb Konecny S, LCAS

## 2019-11-17 ENCOUNTER — Ambulatory Visit (HOSPITAL_COMMUNITY): Payer: BC Managed Care – PPO | Admitting: Licensed Clinical Social Worker

## 2019-11-17 ENCOUNTER — Other Ambulatory Visit: Payer: Self-pay

## 2019-11-24 ENCOUNTER — Other Ambulatory Visit: Payer: Self-pay

## 2019-11-24 ENCOUNTER — Telehealth (HOSPITAL_COMMUNITY): Payer: Self-pay | Admitting: Licensed Clinical Social Worker

## 2019-11-24 ENCOUNTER — Ambulatory Visit (HOSPITAL_COMMUNITY): Payer: BC Managed Care – PPO | Admitting: Licensed Clinical Social Worker

## 2019-11-24 NOTE — Telephone Encounter (Signed)
patient did not present for her webex therapy session today. Sent webex reminder email to patient.  Iman Reinertsen, LCAS

## 2019-12-08 ENCOUNTER — Ambulatory Visit (INDEPENDENT_AMBULATORY_CARE_PROVIDER_SITE_OTHER): Payer: BC Managed Care – PPO | Admitting: Psychiatry

## 2019-12-08 ENCOUNTER — Other Ambulatory Visit: Payer: Self-pay

## 2019-12-08 DIAGNOSIS — F3181 Bipolar II disorder: Secondary | ICD-10-CM

## 2019-12-08 DIAGNOSIS — F902 Attention-deficit hyperactivity disorder, combined type: Secondary | ICD-10-CM | POA: Diagnosis not present

## 2019-12-08 MED ORDER — AMPHETAMINE-DEXTROAMPHETAMINE 15 MG PO TABS: 15.0000 mg | ORAL_TABLET | Freq: Two times a day (BID) | ORAL | 0 refills | Status: DC

## 2019-12-08 MED ORDER — ARIPIPRAZOLE 15 MG PO TABS: 15.0000 mg | ORAL_TABLET | Freq: Every day | ORAL | 2 refills | Status: DC

## 2019-12-08 NOTE — Progress Notes (Signed)
BH MD/PA/NP OP Progress Note  12/08/2019 1:40 PM Annette Molina  MRN:  [id number] Interview was conducted by phone and I verified that I was speaking with the correct person using two identifiers. I discussed the limitations of evaluation and management by telemedicine and  the availability of in person appointments. Patient expressed understanding and agreed to proceed.  Chief Complaint: Mood fluctuations, lack of focus.  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 She also hadfrequentpanic attacks with no identifiable triggers. She reports that her mood swings occur multiple times within a day and are not in response to environmental triggers/stressors. She denies feeling hopeless or suicidal. She has no hx of inpatient psychiatric admissions. She has been diagnosed with ADHD at age 78 and was on Adderall and Strattera in the past. She found the former much more helpful. She was diagnosed with bipolar 2 disorder and has been recently under care of Annette Molina [facility]) who has tried her on [place] then Abilify which she still takes at10mg  daily. She became more depressed with passive SI in early October and entered [facility] (completed 10/30). She reports feeling better i.e. no longer suicidal and being better able to control anxiety. She still has not filled Rx for clonazepam claiming she did not know what it is for (we discussed it at last two visits). She  However does not appear to need it at this time as she does not report having panic attacks lately. Adderall was restarted at 10 mg bid but she still has problems with focusing. [name] has not been ever tried on any mood stabilizers (lithium, divalproate, carbamazepine, lamotrigine) but was briefly on quetiapine which she also found ineffective (only 50 mg though). Abilify 10 mg "helps some"  but she still has mood fluctuations and would like to try a dose increase. She has no hx of alcohol or dug abuse.  Visit Diagnosis:    ICD-10-CM   1. Bipolar 2 disorder (HCC)  F31.81   2. Attention deficit hyperactivity disorder (ADHD), combined type  F90.2     Past Psychiatric History: Please see intake H&P.  Past Medical History:  Past Medical History:  Diagnosis Date  . ADHD (attention deficit hyperactivity disorder)   . Anxiety   . Depression   . Precocious puberty    Was followed by endocrinology.    Past Surgical History:  Procedure Laterality Date  . NO PAST SURGERIES      Family Psychiatric History: None.  Family History:  Family History  Problem Relation Age of Onset  . Hypertension Mother   . Diabetes Father   . Heart disease Father   . Cancer Neg Hx     Social History:  Social History   Socioeconomic History  . Marital status: Single    Spouse name: Not on file  . Number of children: 0  . Years of education: Not on file  . Highest education level: Not on file  Occupational History  . 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 Smoking status: Never Smoker  . Smokeless tobacco: Never Used  Substance and Sexual Activity  . Alcohol use: No    Alcohol/week: 0.0 standard drinks  . Drug use: No  . Sexual activity: Not on file  Other Topics Concern  . Not on file  Social History Narrative   Caffeine Use:  3 daily   Regular Exercise:  2-3 x weekly   Lives with Mom step dad and brother   Attends [employer].   Patient is the granddaughter of [name]         Social Determinants of Health   Financial Resource Strain:   . Difficulty of Paying Living Expenses: Not on file  Food Insecurity:   . Worried About Programme researcher, broadcasting/film/video in the Last Year: Not on file  . Ran Out of Food in the Last Year: Not on file  Transportation Needs:   . Lack of Transportation (Medical): Not on file  . Lack of Transportation (Non-Medical): Not on  file  Physical Activity:   . Days of Exercise per Week: Not on file  . Minutes of Exercise per Session: Not on file  Stress:   . Feeling of Stress : Not on file  Social Connections:   . Frequency of Communication with Friends and Family: Not on file  . Frequency of Social Gatherings with Friends and Family: Not on file  . Attends Religious Services: Not on file  . Active Member of Clubs or Organizations: Not on file  . Attends Banker Meetings: Not on file  . Marital Status: Not on file    Allergies:  Allergies  Allergen Reactions  . Lupron [Leuprolide Acetate]     Was allergic to a preservative in the Lupron.    Metabolic Disorder Labs: Lab Results  Component Value Date   HGBA1C 6.4 08/26/2018   No results found for: PROLACTIN Lab Results  Component Value Date   CHOL 171 08/26/2018   TRIG 146.0 08/26/2018   HDL 36.90 (L) 08/26/2018   CHOLHDL 5 08/26/2018   VLDL 29.2 08/26/2018   LDLCALC 105 (H) 08/26/2018   Lab Results  Component Value Date   TSH 1.97 01/21/2019   TSH 1.53 02/15/2016    Therapeutic Level Labs: No results found for: LITHIUM No results found for: VALPROATE No components found for:  CBMZ  Current Medications: Current Outpatient Medications  Medication Sig Dispense Refill  . amphetamine-dextroamphetamine (ADDERALL) 15 MG tablet Take 1 tablet by mouth 2 (two) times daily. 60 tablet 0  . [START ON 01/07/2020] amphetamine-dextroamphetamine (ADDERALL) 15 MG tablet Take 1 tablet by mouth 2 (two) times daily. 60 tablet 0  . [START ON 02/06/2020] amphetamine-dextroamphetamine (ADDERALL) 15 MG tablet Take 1 tablet by mouth 2 (two) times daily. 60 tablet 0  . ARIPiprazole (ABILIFY) 15 MG tablet Take 1 tablet (15 mg total) by mouth at bedtime. 30 tablet 2  . clonazePAM (KLONOPIN) 1 MG disintegrating tablet Take 1 tablet (1 mg total) by mouth 2 (two) times daily as needed (panic attacks). 60 tablet 1   No current facility-administered medications  for this visit.      Psychiatric Specialty Exam: Review of Systems  Psychiatric/Behavioral: Positive for decreased concentration and sleep disturbance.  All other systems reviewed and are negative.   There were no vitals taken for this visit.There is no height or weight on file to calculate BMI.  General Appearance: NA  Eye Contact:  NA  Speech:  Clear and Coherent and Normal Rate  Volume:  Normal  Mood:  Variable  Affect:  NA  Thought Process:  Goal Directed and Linear  Orientation:  Full (Time, Place, and Person)  Thought Content: Logical   Suicidal Thoughts:  No  Homicidal Thoughts:  No  Memory:  Immediate;   Good Recent;   Good Remote;   Good  Judgement:  Good  Insight:  Fair  Psychomotor Activity:  NA  Concentration:  Concentration: Fair  Recall:  Good  Fund of Knowledge: Good  Language: Good  Akathisia:  Negative  Handed:  Right  AIMS (if indicated): not done  Assets:  Communication Skills Desire for Improvement Financial Resources/Insurance Housing Social Support  ADL's:  Intact  Cognition: WNL  Sleep:  Fair   Screenings: GAD-7     Office Visit from 04/29/2019 in [facility]  Total GAD-7 Score  14    PHQ2-9     Office Visit from 04/29/2019 in [place] Office Visit from 08/26/2018 in [place] Office Visit from 11/10/2016 in [place]  PHQ-2 Total Score  4  0  4  PHQ-9 Total Score  13  1  10        Assessment and Plan: 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 She also hadfrequentpanic attacks with no identifiable triggers. She reports that her mood swings occur multiple times within a day and are not in response to environmental triggers/stressors. She denies feeling  hopeless or suicidal. She has no hx of inpatient psychiatric admissions. She has been diagnosed with ADHD at age 64 and was on Adderall and Strattera in the past. She found the former much more helpful. She was diagnosed with bipolar 2 disorder and has been recently under care of [facility]) who has tried her on [place] then Abilify which she still takes at10mg  daily. She became more depressed with passive SI in early October and entered [facility] (completed 10/30). She reports feeling better i.e. no longer suicidal and being better able to control anxiety. She still has not filled Rx for clonazepam claiming she did not know what it is for (we discussed it at last two visits). She  However does not appear to need it at this time as she does not report having panic attacks lately. Adderall was restarted at 10 mg bid but she still has problems with focusing. [name] has not been ever tried on any mood stabilizers (lithium, divalproate, carbamazepine, lamotrigine) but was briefly on quetiapine which she also found ineffective (only 50 mg though). Abilify 10 mg "helps some" but she still has mood fluctuations and would like to try a dose increase. She has no hx of alcohol or dug abuse.  Dx: Bipolar 2 disorder rapid cycling; ADHD; Panic disorder in remission  Plan: We will continue Abilify but at 15 mg dose at HS, Adderall IR will be increased to 15 mg bid.  Next appointment in two months.The plan was discussed with patient who had an opportunity to ask questions and these were all answered. I spend5minutes inphone consultationwith the patient.    Stephanie Acre, MD 12/08/2019, 1:40 PM

## 2020-01-13 ENCOUNTER — Other Ambulatory Visit: Payer: Self-pay

## 2020-01-13 ENCOUNTER — Ambulatory Visit (INDEPENDENT_AMBULATORY_CARE_PROVIDER_SITE_OTHER): Payer: BC Managed Care – PPO | Admitting: Licensed Clinical Social Worker

## 2020-01-13 ENCOUNTER — Encounter (HOSPITAL_COMMUNITY): Payer: Self-pay | Admitting: Licensed Clinical Social Worker

## 2020-01-13 DIAGNOSIS — F3181 Bipolar II disorder: Secondary | ICD-10-CM

## 2020-01-13 NOTE — Progress Notes (Addendum)
Virtual Visit via Video Note  I connected with Annette Molina on 01/13/20 at 3:00pm EST by a video enabled telemedicine application and verified that I am speaking with the correct person using two identifiers.   I discussed the limitations of evaluation and management by telemedicine and the availability of in person appointments. The patient expressed understanding and agreed to proceed.  History of Present Illness: Patient is referred for individual therapy for Bipolar 2, major depressive episode, panic attack, after completion of IOP. She sees Dr. Demetrius Charity, psychiatrist, for medication management.    Observations/Objective: Patient presented anxious and depressed for video-enabled telemedicine session. Patient discussed her psychiatric symptoms and current life events. Patient reports wanting to return to therapy due to the death of her grandmother, "who practically raised me." Discussed her commitment to therapy, her needs currently and psychiatric symptoms. Made an appointment for Dr. Demetrius Charity for her next week to discuss her current psychiatric symptoms. Made new appointment for therapy next week. Emailed patient 2 grief workbooks to assist with her grief. Gave patient information for [facility] for grief counseling. 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 She has never drank or used drugs. Will explore this at next session.   PLAN: TX PLAN   PLAN: review workbooks  Assessment and Plan: Counselor will continue to meet with patient to address treatment plan goals. Patient will continue to follow recommendations of providers and implement skills learned in session.    Follow Up Instructions:    I discussed the assessment and treatment plan with the patient. The patient was provided an opportunity to ask questions and all were answered. The patient agreed with the plan  and demonstrated an understanding of the instructions.   The patient was advised to call back or seek an in-person evaluation if the symptoms worsen or if the condition fails to improve as anticipated.  I provided 45 minutes of non-face-to-face time during this encounter.   Nezzie Manera S, LCAS

## 2020-01-19 ENCOUNTER — Ambulatory Visit (HOSPITAL_COMMUNITY): Payer: BC Managed Care – PPO | Admitting: Licensed Clinical Social Worker

## 2020-01-19 ENCOUNTER — Other Ambulatory Visit: Payer: Self-pay

## 2020-01-19 ENCOUNTER — Ambulatory Visit (INDEPENDENT_AMBULATORY_CARE_PROVIDER_SITE_OTHER): Payer: BC Managed Care – PPO | Admitting: Psychiatry

## 2020-01-19 DIAGNOSIS — F410 Panic disorder [episodic paroxysmal anxiety] without agoraphobia: Secondary | ICD-10-CM | POA: Diagnosis not present

## 2020-01-19 DIAGNOSIS — F3181 Bipolar II disorder: Secondary | ICD-10-CM

## 2020-01-19 DIAGNOSIS — F902 Attention-deficit hyperactivity disorder, combined type: Secondary | ICD-10-CM

## 2020-01-19 MED ORDER — CLONAZEPAM 0.5 MG PO TABS: 0.5000 mg | ORAL_TABLET | Freq: Two times a day (BID) | ORAL | 1 refills | Status: DC | PRN

## 2020-01-19 MED ORDER — LAMOTRIGINE 25 MG PO TABS: ORAL_TABLET | ORAL | 0 refills | Status: DC

## 2020-01-19 NOTE — Progress Notes (Signed)
BH MD/PA/NP OP Progress Note  01/19/2020 2:48 PM Annette Molina  MRN:  [id number] Interview was conducted by phone and I verified that I was speaking with the correct person using two identifiers. I discussed the limitations of evaluation and management by telemedicine and  the availability of in person appointments. Patient expressed understanding and agreed to proceed.  Chief Complaint: Anxiety, depression, mood fluctuations.  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 She also hadfrequentpanic attacks with no identifiable triggers. She reports that her mood swings occur multiple times within a day and are not in response to environmental triggers/stressors. She denies feeling hopeless or suicidal. She has no hx of inpatient psychiatric admissions. She has been diagnosed with ADHD at age 14and was on Adderall and Strattera in the past. She found the formermuch morehelpful. She was diagnosed with bipolar 2 disorder and has been recently under care of Annette Molina [facility]) who has tried her on [employer] which she still takes at10mg  daily. She became more depressed with passive SI in early October and entered [facility] on 10/14 (completed 10/30). She reports feeling more depressed, anxious, grieving loss of her grandmother in early January (she practically raised Annette Molina). Some problems with sleep for which she takes melatonin.  Adderallwas restarted (now at 15 mg bid) for problems with focusing.[name] has not been ever tried on any mood stabilizers (lithium, divalproate, carbamazepine, lamotrigine) but was briefly on quetiapine which she also found ineffective (only 50 mg though). Abilify 15 mg "helps some" but she still has mood fluctuations. She has no hx of alcohol or dug abuse.  Visit Diagnosis:    ICD-10-CM   1. Bipolar 2 disorder  (HCC)  F31.81   2. Panic disorder  F41.0   3. Attention deficit hyperactivity disorder (ADHD), combined type  F90.2     Past Psychiatric History: Please see intake H&P.  Past Medical History:  Past Medical History:  Diagnosis Date  . ADHD (attention deficit hyperactivity disorder)   . Anxiety   . Depression   . Precocious puberty    Was followed by endocrinology.    Past Surgical History:  Procedure Laterality Date  . NO PAST SURGERIES      Family Psychiatric History: None.  Family History:  Family History  Problem Relation Age of Onset  . Hypertension Mother   . Diabetes Father   . Heart disease Father   . Cancer Neg Hx     Social History:  Social History   Socioeconomic History  . Marital status: Single    Spouse name: Not on file  . Number of children: 0  . Years of education: Not on file  . Highest education level: Not on file  Occupational History  . 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 Smoking status: Never Smoker  . Smokeless tobacco: Never Used  Substance and Sexual Activity  . Alcohol use: No    Alcohol/week: 0.0 standard drinks  . Drug use: No  . Sexual activity: Not on file  Other Topics Concern  . Not on file  Social History Narrative   Caffeine Use:  3 daily   Regular Exercise:  2-3 x weekly   Lives with Mom step dad and brother   Attends [employer].   Patient is the granddaughter of Annette Molina         Social Determinants  of Health   Financial Resource Strain:   . Difficulty of Paying Living Expenses: Not on file  Food Insecurity:   . Worried About Charity fundraiser in the Last Year: Not on file  . Ran Out of Food in the Last Year: Not on file  Transportation Needs:   . Lack of Transportation (Medical): Not on file  . Lack of Transportation (Non-Medical): Not on file  Physical Activity:   . Days of Exercise per Week: Not on file  . Minutes of Exercise per Session: Not on file  Stress:   . Feeling of  Stress : Not on file  Social Connections:   . Frequency of Communication with Friends and Family: Not on file  . Frequency of Social Gatherings with Friends and Family: Not on file  . Attends Religious Services: Not on file  . Active Member of Clubs or Organizations: Not on file  . Attends Archivist Meetings: Not on file  . Marital Status: Not on file    Allergies:  Allergies  Allergen Reactions  . Lupron [Leuprolide Acetate]     Was allergic to a preservative in the Lupron.    Metabolic Disorder Labs: Lab Results  Component Value Date   HGBA1C 6.4 08/26/2018   No results found for: PROLACTIN Lab Results  Component Value Date   CHOL 171 08/26/2018   TRIG 146.0 08/26/2018   HDL 36.90 (L) 08/26/2018   CHOLHDL 5 08/26/2018   VLDL 29.2 08/26/2018   LDLCALC 105 (H) 08/26/2018   Lab Results  Component Value Date   TSH 1.97 01/21/2019   TSH 1.53 02/15/2016    Therapeutic Level Labs: No results found for: LITHIUM No results found for: VALPROATE No components found for:  CBMZ  Current Medications: Current Outpatient Medications  Medication Sig Dispense Refill  . amphetamine-dextroamphetamine (ADDERALL) 15 MG tablet Take 1 tablet by mouth 2 (two) times daily. 60 tablet 0  . amphetamine-dextroamphetamine (ADDERALL) 15 MG tablet Take 1 tablet by mouth 2 (two) times daily. 60 tablet 0  . [START ON 02/06/2020] amphetamine-dextroamphetamine (ADDERALL) 15 MG tablet Take 1 tablet by mouth 2 (two) times daily. 60 tablet 0  . ARIPiprazole (ABILIFY) 15 MG tablet Take 1 tablet (15 mg total) by mouth at bedtime. 30 tablet 2  . clonazePAM (KLONOPIN) 0.5 MG tablet Take 1 tablet (0.5 mg total) by mouth 2 (two) times daily as needed for anxiety. 60 tablet 1  . lamoTRIgine (LAMICTAL) 25 MG tablet Take 1 tablet (25 mg total) by mouth at bedtime for 14 days, THEN 2 tablets (50 mg total) at bedtime for 16 days. 46 tablet 0   No current facility-administered medications for this  visit.     Psychiatric Specialty Exam: Review of Systems  Psychiatric/Behavioral: Positive for dysphoric mood. The patient is nervous/anxious.   All other systems reviewed and are negative.   There were no vitals taken for this visit.There is no height or weight on file to calculate BMI.  General Appearance: NA  Eye Contact:  NA  Speech:  Clear and Coherent and Normal Rate  Volume:  Normal  Mood:  Anxious and Depressed  Affect:  NA  Thought Process:  Goal Directed and Linear  Orientation:  Full (Time, Place, and Person)  Thought Content: Logical   Suicidal Thoughts:  No  Homicidal Thoughts:  No  Memory:  Immediate;   Good Recent;   Good Remote;   Good  Judgement:  Good  Insight:  Fair  Psychomotor  Activity:  NA  Concentration:  Concentration: Fair  Recall:  Good  Fund of Knowledge: Good  Language: Good  Akathisia:  Negative  Handed:  Right  AIMS (if indicated): not done  Assets:  Communication Skills Desire for Improvement Financial Resources/Insurance Housing Physical Health Talents/Skills  ADL's:  Intact  Cognition: WNL  Sleep:  Fair   Screenings: GAD-7     Office Visit from 04/29/2019 in [facility]  Total GAD-7 Score  14    PHQ2-9     Office Visit from 04/29/2019 in [facility] Office Visit from 08/26/2018 in [facility] Office Visit from 11/10/2016 in [facility]  PHQ-2 Total Score  4  0  4  PHQ-9 Total Score  13  1  10        Assessment and Plan: 24 yo single AAF a 31 at [employer]ing [employer] 10 year hx of rapid mood fluctuations, racing thoughts, insomnia, anger/irritability and problems with focusing/atttemtion. She also hadfrequentpanic attacks with no identifiable triggers. She reports that her mood swings occur multiple times within a day and are not in response to environmental triggers/stressors.  She denies feeling hopeless or suicidal. She has no hx of inpatient psychiatric admissions. She has been diagnosed with ADHD at age 14and was on Adderall and Strattera in the past. She found the formermuch morehelpful. She was diagnosed with bipolar 2 disorder and has been recently under care of [employer] [facility]) who has tried her on [facility] which she still takes at10mg  daily. She became more depressed with passive SI in early October and entered [facility] on 10/14 (completed 10/30). She reports feeling more depressed, anxious, grieving loss of her grandmother in early January (she practically raised Annette Molina). Some problems with sleep for which she takes melatonin.  Adderallwas restarted (now at 15 mg bid) for problems with focusing.February has not been ever tried on any mood stabilizers (lithium, divalproate, carbamazepine, lamotrigine) but was briefly on quetiapine which she also found ineffective (only 50 mg though). Abilify 15 mg "helps some" but she still has mood fluctuations. She has no hx of alcohol or dug abuse.  Dx: Bipolar 2 disorder rapid cycling; ADHD; Panic disorder; Bereavement   Plan: We will continue Abilify 15 mg at HS,Adderall IR 15 mg bid and add clonazepam 0.5 mg bid for anxiety and start Lamictal titration for further mood stabilization.  Next appointment in two weeks (already scheduled). The plan was discussed with patient who had an opportunity to ask questions and these were all answered. I spend27minutes inphone consultationwith the patient.    30m, MD 01/19/2020, 2:48 PM

## 2020-01-20 ENCOUNTER — Ambulatory Visit (INDEPENDENT_AMBULATORY_CARE_PROVIDER_SITE_OTHER): Payer: BC Managed Care – PPO | Admitting: Licensed Clinical Social Worker

## 2020-01-20 ENCOUNTER — Other Ambulatory Visit: Payer: Self-pay

## 2020-01-20 ENCOUNTER — Encounter (HOSPITAL_COMMUNITY): Payer: Self-pay | Admitting: Licensed Clinical Social Worker

## 2020-01-20 DIAGNOSIS — F410 Panic disorder [episodic paroxysmal anxiety] without agoraphobia: Secondary | ICD-10-CM | POA: Diagnosis not present

## 2020-01-20 DIAGNOSIS — F3181 Bipolar II disorder: Secondary | ICD-10-CM
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
## 2020-01-28 ENCOUNTER — Encounter (HOSPITAL_COMMUNITY): Payer: Self-pay | Admitting: Licensed Clinical Social Worker

## 2020-01-28 ENCOUNTER — Ambulatory Visit (INDEPENDENT_AMBULATORY_CARE_PROVIDER_SITE_OTHER): Payer: BC Managed Care – PPO | Admitting: Licensed Clinical Social Worker

## 2020-01-28 ENCOUNTER — Other Ambulatory Visit: Payer: Self-pay

## 2020-01-28 DIAGNOSIS — F3181 Bipolar II disorder: Secondary | ICD-10-CM | POA: Diagnosis not present

## 2020-01-28 DIAGNOSIS — F410 Panic disorder [episodic paroxysmal anxiety] without agoraphobia: Secondary | ICD-10-CM
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
## 2020-02-04 ENCOUNTER — Encounter (HOSPITAL_COMMUNITY): Payer: Self-pay | Admitting: Licensed Clinical Social Worker

## 2020-02-04 ENCOUNTER — Ambulatory Visit (INDEPENDENT_AMBULATORY_CARE_PROVIDER_SITE_OTHER): Payer: BC Managed Care – PPO | Admitting: Licensed Clinical Social Worker

## 2020-02-04 ENCOUNTER — Other Ambulatory Visit: Payer: Self-pay

## 2020-02-04 DIAGNOSIS — F3181 Bipolar II disorder: Secondary | ICD-10-CM

## 2020-02-04 NOTE — Progress Notes (Signed)
Virtual Visit via Video Note  I connected with Annette Molina on 02/04/20 at 1:00pm EST by a video enabled telemedicine application and verified that I am speaking with the correct person using two identifiers.   I discussed the limitations of evaluation and management by telemedicine and the availability of in person appointments. The patient expressed understanding and agreed to proceed.  History of Present Illness: Patient is referred for individual therapy for Bipolar 2, rapid cycling, panic attack, after completion of IOP. She sees Dr. Demetrius Charity, psychiatrist, for medication management.    Observations/Objective: Patient presented  depressed for video-enabled telemedicine session. Patient discussed her psychiatric symptoms and current life events. Patient joined the session late and had technical difficulties so session was 25 minutes.  Patient reports she has taken her medications as prescribed, but has not used the klonopin. She reports she has a new her job and start tonight. Discussed the 3 W's to stay safe working with the public. Patient reports she has not experienced any "blackouts' since last session. Patient reports she and her mother reviewed the PTSD informational sheets emailed and asked appropriate questions about PTSD. Suggested she also talk with her psychiatrist at tomorrows' appointment. She may experiencing symptoms of PTSD.Patient admits to continued grieving the loss of her grandmother. Suggested patient follow through with contacting hospice.    PLAN: review grief workbooks, call to hospice, impact statement, alcohol as a coping skill, PTSD informational sheets  Assessment and Plan: Counselor will continue to meet with patient to address treatment plan goals. Patient will continue to follow recommendations of providers and implement skills learned in session.   Follow Up Instructions:  I discussed the assessment and treatment plan with the patient. The patient was provided  an opportunity to ask questions and all were answered. The patient agreed with the plan and demonstrated an understanding of the instructions.   The patient was advised to call back or seek an in-person evaluation if the symptoms worsen or if the condition fails to improve as anticipated.  I provided 25 minutes of non-face-to-face time during this encounter.   Oluwatobiloba Martin S, LCAS

## 2020-02-05 ENCOUNTER — Other Ambulatory Visit: Payer: Self-pay

## 2020-02-05 ENCOUNTER — Ambulatory Visit (INDEPENDENT_AMBULATORY_CARE_PROVIDER_SITE_OTHER): Payer: BC Managed Care – PPO | Admitting: Psychiatry

## 2020-02-05 DIAGNOSIS — F3181 Bipolar II disorder: Secondary | ICD-10-CM

## 2020-02-05 DIAGNOSIS — F410 Panic disorder [episodic paroxysmal anxiety] without agoraphobia: Secondary | ICD-10-CM | POA: Diagnosis not present

## 2020-02-05 DIAGNOSIS — F902 Attention-deficit hyperactivity disorder, combined type: Secondary | ICD-10-CM | POA: Diagnosis not present

## 2020-02-05 MED ORDER — ARIPIPRAZOLE 15 MG PO TABS: 15.0000 mg | ORAL_TABLET | Freq: Every day | ORAL | 2 refills | Status: DC

## 2020-02-05 NOTE — Progress Notes (Signed)
BH MD/PA/NP OP Progress Note  02/05/2020 1:42 PM Annette Molina  MRN:  [id number] Interview was conducted by phone and I verified that I was speaking with the correct person using two identifiers. I discussed the limitations of evaluation and management by telemedicine and  the availability of in person appointments. Patient expressed understanding and agreed to proceed.  Chief Complaint: Mood  Fluctuations.  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 She also hadfrequentpanic attacks with no identifiable triggers. She reports that her mood swings occur multiple times within a day and are not in response to environmental triggers/stressors. She denies feeling hopeless or suicidal. She has no hx of inpatient psychiatric admissions. She has been diagnosed with ADHD at age 14and was on Adderall and Strattera in the past. She found the formermuch morehelpful. She was diagnosed with bipolar 2 disorder and has been recently under care of [facility]) who has tried her on [employer] which she still takes at10mg  daily. She became more depressed with passive SI in early October and entered [facility] on 10/14 (completed 10/30). She reports feeling more depressed, anxious, grieving loss of her grandmother in early January (she practically raised [name]). Some problems with sleep for which she takes melatonin.  Adderallwas restarted(now at 15 mg bid) for problems with focusing.[name] has not been ever tried on any mood stabilizers (lithium, divalproate, carbamazepine, lamotrigine) but was briefly on quetiapine which she also found ineffective (only 50 mg though).Abilify 15 mg "helps some" but she still has mood fluctuations.We planned to start lamotrigine titration and added clonazepam prn anxiety but she has not picked either prescription from  the pharmacy yet (was told that "someone there has COVID). She plans to pick them up on Monday.   Visit Diagnosis:    ICD-10-CM   1. Attention deficit hyperactivity disorder (ADHD), combined type  F90.2   2. Bipolar 2 disorder (HCC)  F31.81   3. Panic disorder  F41.0     Past Psychiatric History: Please see intake H&P.  Past Medical History:  Past Medical History:  Diagnosis Date  . ADHD (attention deficit hyperactivity disorder)   . Anxiety   . Depression   . Precocious puberty    Was followed by endocrinology.    Past Surgical History:  Procedure Laterality Date  . NO PAST SURGERIES      Family Psychiatric History: None.  Family History:  Family History  Problem Relation Age of Onset  . Hypertension Mother   . Diabetes Father   . Heart disease Father   . Cancer Neg Hx     Social History:  Social History   Socioeconomic History  . Marital status: Single    Spouse name: Not on file  . Number of children: 0  . Years of education: Not on file  . Highest education level: Not on file  Occupational History  . 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 Smoking status: Never Smoker  . Smokeless tobacco: Never Used  Substance and Sexual Activity  . Alcohol use: No    Alcohol/week: 0.0 standard drinks  . Drug use: No  . Sexual activity: Not on file  Other Topics Concern  . Not on file  Social History Narrative   Caffeine Use:  3 daily   Regular Exercise:  2-3 x weekly   Lives with Mom step dad and  brother   Attends [employer].   Patient is the granddaughter of [name]         Social Determinants of Health   Financial Resource Strain:   . Difficulty of Paying Living Expenses:   Food Insecurity:   . Worried About Programme researcher, broadcasting/film/video in the Last Year:   . Barista in the Last Year:   Transportation Needs:   . Freight forwarder (Medical):   Marland Kitchen Lack of Transportation (Non-Medical):   Physical Activity:   . Days of  Exercise per Week:   . Minutes of Exercise per Session:   Stress:   . Feeling of Stress :   Social Connections:   . Frequency of Communication with Friends and Family:   . Frequency of Social Gatherings with Friends and Family:   . Attends Religious Services:   . Active Member of Clubs or Organizations:   . Attends Banker Meetings:   Marland Kitchen Marital Status:     Allergies:  Allergies  Allergen Reactions  . Lupron [Leuprolide Acetate]     Was allergic to a preservative in the Lupron.    Metabolic Disorder Labs: Lab Results  Component Value Date   HGBA1C 6.4 08/26/2018   No results found for: PROLACTIN Lab Results  Component Value Date   CHOL 171 08/26/2018   TRIG 146.0 08/26/2018   HDL 36.90 (L) 08/26/2018   CHOLHDL 5 08/26/2018   VLDL 29.2 08/26/2018   LDLCALC 105 (H) 08/26/2018   Lab Results  Component Value Date   TSH 1.97 01/21/2019   TSH 1.53 02/15/2016    Therapeutic Level Labs: No results found for: LITHIUM No results found for: VALPROATE No components found for:  CBMZ  Current Medications: Current Outpatient Medications  Medication Sig Dispense Refill  . amphetamine-dextroamphetamine (ADDERALL) 15 MG tablet Take 1 tablet by mouth 2 (two) times daily. 60 tablet 0  . amphetamine-dextroamphetamine (ADDERALL) 15 MG tablet Take 1 tablet by mouth 2 (two) times daily. 60 tablet 0  . [START ON 02/06/2020] amphetamine-dextroamphetamine (ADDERALL) 15 MG tablet Take 1 tablet by mouth 2 (two) times daily. 60 tablet 0  . [START ON 03/08/2020] ARIPiprazole (ABILIFY) 15 MG tablet Take 1 tablet (15 mg total) by mouth at bedtime. 30 tablet 2  . clonazePAM (KLONOPIN) 0.5 MG tablet Take 1 tablet (0.5 mg total) by mouth 2 (two) times daily as needed for anxiety. 60 tablet 1  . lamoTRIgine (LAMICTAL) 25 MG tablet Take 1 tablet (25 mg total) by mouth at bedtime for 14 days, THEN 2 tablets (50 mg total) at bedtime for 16 days. 46 tablet 0   No current  facility-administered medications for this visit.    Psychiatric Specialty Exam: Review of Systems  Psychiatric/Behavioral: The patient is nervous/anxious.   All other systems reviewed and are negative.   There were no vitals taken for this visit.There is no height or weight on file to calculate BMI.  General Appearance: NA  Eye Contact:  NA  Speech:  Clear and Coherent and Normal Rate  Volume:  Normal  Mood:  Anxious and Irritable  Affect:  NA  Thought Process:  Goal Directed and Linear  Orientation:  Full (Time, Place, and Person)  Thought Content: Logical   Suicidal Thoughts:  No  Homicidal Thoughts:  No  Memory:  Immediate;   Good Recent;   Good Remote;   Good  Judgement:  Fair  Insight:  Fair  Psychomotor Activity:  NA  Concentration:  Concentration: Good  Recall:  Good  Fund of Knowledge: Good  Language: Good  Akathisia:  Negative  Handed:  Right  AIMS (if indicated): not done  Assets:  Communication Skills Desire for Improvement Financial Resources/Insurance Housing Talents/Skills  ADL's:  Intact  Cognition: WNL  Sleep:  Good   Screenings: GAD-7     Office Visit from 04/29/2019 in [facility]  Total GAD-7 Score  14    PHQ2-9     Office Visit from 04/29/2019 in [facility] Office Visit from 08/26/2018 in [place] Office Visit from 11/10/2016 in [facility]  PHQ-2 Total Score  4  0  4  PHQ-9 Total Score  13  1  10        Assessment and Plan: 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 She also hadfrequentpanic attacks with no identifiable triggers. She reports that her mood swings occur multiple times within a day and are not in response to environmental triggers/stressors. She  denies feeling hopeless or suicidal. She has no hx of inpatient psychiatric admissions. She has been diagnosed with ADHD at age 89and was on Adderall and Strattera in the past. She found the formermuch morehelpful. She was diagnosed with bipolar 2 disorder and has been recently under care of [facility]) who has tried her on [employer] which she still takes at10mg  daily. She became more depressed with passive SI in early October and entered [facility] on 10/14 (completed 10/30). She reports feeling more depressed, anxious, grieving loss of her grandmother in early January (she practically raised [name]). Some problems with sleep for which she takes melatonin.  Adderallwas restarted(now at 15 mg bid) for problems with focusing.[name] has not been ever tried on any mood stabilizers (lithium, divalproate, carbamazepine, lamotrigine) but was briefly on quetiapine which she also found ineffective (only 50 mg though).Abilify 15 mg "helps some" but she still has mood fluctuations.We planned to start lamotrigine titration and added clonazepam prn anxiety but she has not picked either prescription from the pharmacy yet (was told that "someone there has COVID). She plans to pick them up on Monday.   Dx: Bipolar 2 disorder rapid cycling;ADHD;Panic disorder; Bereavement   Plan: We will continue Abilify15 mg at [place] IR15mg  bid and add clonazepam 0.5 mg bid for anxiety and start Lamictal titration for further mood stabilization.Next appointment in one month. The plan was discussed with patient who had an opportunity to ask questions and these were all answered. I spend15 minutes inphone consultationwith the patient.    Stephanie Acre, MD 02/05/2020, 1:42 PM

## 2020-02-06 ENCOUNTER — Ambulatory Visit (INDEPENDENT_AMBULATORY_CARE_PROVIDER_SITE_OTHER): Payer: BC Managed Care – PPO | Admitting: Physician Assistant

## 2020-02-06 ENCOUNTER — Encounter: Payer: Self-pay | Admitting: Physician Assistant

## 2020-02-06 DIAGNOSIS — J302 Other seasonal allergic rhinitis: Secondary | ICD-10-CM

## 2020-02-06 NOTE — Progress Notes (Signed)
Virtual Visit via Video   I connected with patient on 02/06/20 at 10:30 AM EST by a video enabled telemedicine application and verified that I am speaking with the correct person using two identifiers.  Location patient: Home Location provider: [place] [facility], Office Persons participating in the virtual visit: Patient, Provider, CMA (Patina Moore)  I discussed the limitations of evaluation and management by telemedicine and the availability of in person appointments. The patient expressed understanding and agreed to proceed.  Subjective:   HPI:   Patient presents via [facility] today c/o 2 days of rhinorrhea, sneezing, watery eyes and some mild post nasal drip. Denies fever, chills, cough or chest congestion, loss of taste or smell, recent travel or sick contact. Patient with history of seasonal rhinitis but has not been taking her Zyrtec regularly.  Just restarted in the past 2 days and noting a significant improvement.  Does note she has been sleeping in front of a fan which could be contributing.   ROS:   See pertinent positives and negatives per HPI.  Patient Active Problem List   Diagnosis Date Noted  . Attention deficit hyperactivity disorder (ADHD), combined type 08/26/2019  . Panic disorder 08/26/2019  . 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 Mood disorder (HCC) 05/20/2019  . Piriformis syndrome of right side 05/20/2019  . Visit for preventive health examination 08/26/2018  . Screening-pulmonary TB 08/26/2018  . Left ankle injury, subsequent encounter 02/27/2018  . Allergic rhinitis 12/03/2017  . Obesity 02/15/2016  . Pseudotumor cerebri 12/13/2015  . Hyperpigmentation 11/17/2015  . Bipolar 2 disorder (HCC) 05/27/2015  . Dysmenorrhea in the adolescent 07/04/2011    Social History   Tobacco Use  . Smoking status: Never Smoker  . Smokeless tobacco: Never Used  Substance Use Topics  . Alcohol use: No    Alcohol/week: 0.0 standard drinks    Current Outpatient  Medications:  .  amphetamine-dextroamphetamine (ADDERALL) 15 MG tablet, Take 1 tablet by mouth 2 (two) times daily., Disp: 60 tablet, Rfl: 0 .  amphetamine-dextroamphetamine (ADDERALL) 15 MG tablet, Take 1 tablet by mouth 2 (two) times daily., Disp: 60 tablet, Rfl: 0 .  amphetamine-dextroamphetamine (ADDERALL) 15 MG tablet, Take 1 tablet by mouth 2 (two) times daily., Disp: 60 tablet, Rfl: 0 .  [START ON 03/08/2020] ARIPiprazole (ABILIFY) 15 MG tablet, Take 1 tablet (15 mg total) by mouth at bedtime., Disp: 30 tablet, Rfl: 2 .  cetirizine (ZYRTEC) 10 MG chewable tablet, Chew 10 mg by mouth daily., Disp: , Rfl:  .  clonazePAM (KLONOPIN) 0.5 MG tablet, Take 1 tablet (0.5 mg total) by mouth 2 (two) times daily as needed for anxiety. (Patient not taking: Reported on 02/06/2020), Disp: 60 tablet, Rfl: 1 .  lamoTRIgine (LAMICTAL) 25 MG tablet, Take 1 tablet (25 mg total) by mouth at bedtime for 14 days, THEN 2 tablets (50 mg total) at bedtime for 16 days. (Patient not taking: Reported on 02/06/2020), Disp: 46 tablet, Rfl: 0  Allergies  Allergen Reactions  . Lupron [Leuprolide Acetate]     Was allergic to a preservative in the Lupron.    Objective:   There were no vitals taken for this visit.  Patient is well-developed, well-nourished in no acute distress.  Resting comfortably at home.  Head is normocephalic, atraumatic.  No labored breathing.  Speech is clear and coherent with logical content.  Patient is alert and oriented at baseline.   Assessment and Plan:   1. Seasonal allergic rhinitis, unspecified trigger No recent travel or sick contact.  No known Covid exposure.  Symptoms are consistent with prior flares of her allergic rhinitis.  Supportive measures reviewed with patient.  Continue Zyrtec nightly.  Start OTC Flonase or Nasacort.  Also start nightly saline nasal spray.  Work note written.  She is to notify us if symptoms or not improving, any new symptoms develop or if anything is  worsening.    Leeanne Rio, PA-C 02/06/2020

## 2020-02-06 NOTE — Progress Notes (Signed)
I have discussed the procedure for the virtual visit with the patient who has given consent to proceed with assessment and treatment.   Pt is unable to get vitals during visit.  Con Memos, CMA

## 2020-02-06 NOTE — Patient Instructions (Signed)
Instructions sent to MyChart.  Please keep well-hydrated and get plenty of rest. Start a nightly saline nasal rinse. Continue Zyrtec daily. Start an over-the-counter Flonase or Nasacort spray once daily. Keep hands washed and keep masking.  Since this seems related to your allergies we are okay having you continue working.  Report to [place] any new or worsening symptoms ASAP.  I have sent a work note to Pharmacologist.  Hang in there!

## 2020-02-09 ENCOUNTER — Encounter (INDEPENDENT_AMBULATORY_CARE_PROVIDER_SITE_OTHER): Payer: Self-pay | Admitting: Family Medicine

## 2020-02-09 ENCOUNTER — Ambulatory Visit (INDEPENDENT_AMBULATORY_CARE_PROVIDER_SITE_OTHER): Payer: BC Managed Care – PPO | Admitting: Family Medicine

## 2020-02-09 ENCOUNTER — Other Ambulatory Visit: Payer: Self-pay

## 2020-02-09 VITALS — BP 109/76 | HR 72 | Temp 98.1°F | Ht 67.0 in | Wt 278.0 lb

## 2020-02-09 DIAGNOSIS — Z1331 Encounter for screening for depression: Secondary | ICD-10-CM

## 2020-02-09 DIAGNOSIS — R5383 Other fatigue: Secondary | ICD-10-CM | POA: Diagnosis not present

## 2020-02-09 DIAGNOSIS — G932 Benign intracranial hypertension: Secondary | ICD-10-CM

## 2020-02-09 DIAGNOSIS — D649 Anemia, unspecified: Secondary | ICD-10-CM

## 2020-02-09 DIAGNOSIS — Z9189 Other specified personal risk factors, not elsewhere classified: Secondary | ICD-10-CM

## 2020-02-09 DIAGNOSIS — R7303 Prediabetes: Secondary | ICD-10-CM | POA: Diagnosis not present

## 2020-02-09 DIAGNOSIS — Z6841 Body Mass Index (BMI) 40.0 and over, adult: Secondary | ICD-10-CM

## 2020-02-09 DIAGNOSIS — Z0289 Encounter for other administrative examinations: Secondary | ICD-10-CM

## 2020-02-09 DIAGNOSIS — E7849 Other hyperlipidemia: Secondary | ICD-10-CM

## 2020-02-09 DIAGNOSIS — R0602 Shortness of breath: Secondary | ICD-10-CM

## 2020-02-09 NOTE — Progress Notes (Signed)
Dear Brunetta Jeans, PA-C,   Thank you for referring Hera Celaya to our clinic. The following note includes my evaluation and treatment recommendations.  Chief Complaint:   OBESITY Annette Molina (MR# [id number]) is a 24 y.o. female who presents for evaluation and treatment of obesity and related comorbidities. Current BMI is Body mass index is 43.54 kg/m. Nazifa has been struggling with her weight for many years and has been unsuccessful in either losing weight, maintaining weight loss, or reaching her healthy weight goal.  Olanna skips breakfast due to time. She may have 2 packages of small oatmeal cakes, or 1 bag of Doritos or BBQ chips (satisfied). For lunch, she is doing [employer] Big Mac or bundle burger, fries, and sweet tea (satisfied). For dinner, she is doing the same as lunch, or [employer] salami, bacon, zucchini footlong.  Madgeline is currently in the action stage of change and ready to dedicate time achieving and maintaining a healthier weight. Brittay is interested in becoming our patient and working on intensive lifestyle modifications including (but not limited to) diet and exercise for weight loss.  Aimi's habits were reviewed today and are as follows: Her family eats meals together, her desired weight loss is 88 lbs, she started gaining weight at age 32, her heaviest weight ever was 267 pounds, she is a picky eater and doesn't like to eat healthier foods, she has significant food cravings issues, she snacks frequently in the evenings, she skips meals frequently, she is frequently drinking liquids with calories, she frequently makes poor food choices, she has problems with excessive hunger, she frequently eats larger portions than normal and she struggles with emotional eating.  Depression Screen Rileigh's Food and Mood (modified PHQ-9) score was 7.  Depression screen PHQ 2/9 02/09/2020  Decreased Interest 1  Down, Depressed, Hopeless 1  PHQ - 2 Score 2    Altered sleeping 1  Tired, decreased energy 2  Change in appetite 1  Feeling bad or failure about yourself  0  Trouble concentrating 1  Moving slowly or fidgety/restless 0  Suicidal thoughts 0  PHQ-9 Score 7  Difficult doing work/chores Not difficult at all  Some recent data might be hidden   Subjective:   1. Other fatigue Talana admits to daytime somnolence and admits to waking up still tired. Patent has a history of symptoms of daytime fatigue. Lorenna generally gets 6 or 8 hours of sleep per night, and states that she has nightime awakenings. Snoring is present. Apneic episodes are not present. Epworth Sleepiness Score is 5.  2. SOB (shortness of breath) on exertion Lexi notes increasing shortness of breath with exercising and seems to be worsening over time with weight gain. She notes getting out of breath sooner with activity than she used to. This has not gotten worse recently. Etta denies shortness of breath at rest or orthopnea. EKG-normal sinus rhythm at 64 BPM.  3. Pre-diabetes Mirranda's last A1c was 6.4 on 08/26/2018. She has no follow up labs in Epic.  4. Anemia, unspecified type Harper has unspecified anemia, and she is not on medications.  5. Other hyperlipidemia Meagan's LDL was elevated on her last 2 lab draws. She is not on medications.  6. Pseudotumor cerebri Isra has to get spinal taps done to drain fluid. She is not on medications. She reports intervention yearly.  7. At risk for diabetes mellitus Thamara is at higher than average risk for developing diabetes due to her obesity.   Assessment/Plan:   1.  Other fatigue Lajuan does feel that her weight is causing her energy to be lower than it should be. Fatigue may be related to obesity, depression or many other causes. Labs will be ordered, and in the meanwhile, Kale will focus on self care including making healthy food choices, increasing physical activity and focusing on stress reduction.  -  EKG 12-Lead - Comprehensive metabolic panel - VITAMIN D 25 Hydroxy (Vit-D Deficiency, Fractures) - Vitamin B12 - Folate - T3 - T4, free - TSH  2. SOB (shortness of breath) on exertion Dyesha does feel that she gets out of breath more easily that she used to when she exercises. Torunn's shortness of breath appears to be obesity related and exercise induced. She has agreed to work on weight loss and gradually increase exercise to treat her exercise induced shortness of breath. Will continue to monitor closely.  3. Pre-diabetes Lam will continue to work on weight loss, exercise, and decreasing simple carbohydrates to help decrease the risk of diabetes. We will check labs today.  - Hemoglobin A1c - Insulin, random  4. Anemia, unspecified type We will check labs today. Orders and follow up as documented in patient record.  - CBC with Differential/Platelet - Anemia panel  5. Other hyperlipidemia Cardiovascular risk and specific lipid/LDL goals reviewed. We discussed several lifestyle modifications today and Seeley will continue to work on diet, exercise and weight loss efforts. We will check labs today. Orders and follow up as documented in patient record.   Counseling Intensive lifestyle modifications are the first line treatment for this issue. . Dietary changes: Increase soluble fiber. Decrease simple carbohydrates. . Exercise changes: Moderate to vigorous-intensity aerobic activity 150 minutes per week if tolerated. . Lipid-lowering medications: see documented in medical record.  - Lipid Panel With LDL/HDL Ratio  6. Pseudotumor cerebri Bonnielee Haff will continue to follow up with her primary care provider, Raiford Noble, PA-C.  7. Depression screening Raseel had a positive depression screening. Depression is commonly associated with obesity and often results in emotional eating behaviors. We will monitor this closely and work on CBT to help improve the non-hunger eating  patterns. Referral to Psychology may be required if no improvement is seen as she continues in our clinic.  8. At risk for diabetes mellitus Ottilie was given approximately 15 minutes of diabetes education and counseling today. We discussed intensive lifestyle modifications today with an emphasis on weight loss as well as increasing exercise and decreasing simple carbohydrates in her diet. We also reviewed medication options with an emphasis on risk versus benefit of those discussed.   Repetitive spaced learning was employed today to elicit superior memory formation and behavioral change.  9. Class 3 severe obesity with serious comorbidity and body mass index (BMI) of 40.0 to 44.9 in adult, unspecified obesity type (HCC) Marisol is currently in the action stage of change and her goal is to continue with weight loss efforts. I recommend Lasean begin the structured treatment plan as follows:  She has agreed to the Category 2 Plan.  Exercise goals: No exercise has been prescribed at this time.   Behavioral modification strategies: increasing lean protein intake, increasing vegetables, meal planning and cooking strategies, keeping healthy foods in the home and planning for success.  She was informed of the importance of frequent follow-up visits to maximize her success with intensive lifestyle modifications for her multiple health conditions. She was informed we would discuss her lab results at her next visit unless there is a critical issue that needs  to be addressed sooner. Celestina agreed to keep her next visit at the agreed upon time to discuss these results.  Objective:   Blood pressure 109/76, pulse 72, temperature 98.1 F (36.7 C), temperature source Oral, height _0  (1.702 m), weight 278 lb (126.1 kg), last menstrual period 01/29/2020, SpO2 100 %. Body mass index is 43.54 kg/m.  EKG: Normal sinus rhythm, rate 64 BPM.  Indirect Calorimeter completed today shows a VO2 of 193 and a REE  of 1340.  Her calculated basal metabolic rate is 6226 thus her basal metabolic rate is worse than expected.  General: Cooperative, alert, well developed, in no acute distress. HEENT: Conjunctivae and lids unremarkable. Cardiovascular: Regular rhythm.  Lungs: Normal work of breathing. Neurologic: No focal deficits.   Lab Results  Component Value Date   CREATININE 0.52 08/26/2018   BUN 10 08/26/2018   NA 140 08/26/2018   K 3.7 08/26/2018   CL 107 08/26/2018   CO2 27 08/26/2018   Lab Results  Component Value Date   ALT 14 08/26/2018   AST 14 08/26/2018   ALKPHOS 72 08/26/2018   BILITOT 0.3 08/26/2018   Lab Results  Component Value Date   HGBA1C 6.4 08/26/2018   No results found for: INSULIN Lab Results  Component Value Date   TSH 1.97 01/21/2019   Lab Results  Component Value Date   CHOL 171 08/26/2018   HDL 36.90 (L) 08/26/2018   LDLCALC 105 (H) 08/26/2018   TRIG 146.0 08/26/2018   CHOLHDL 5 08/26/2018   Lab Results  Component Value Date   WBC 6.4 01/21/2019   HGB 11.7 (L) 01/21/2019   HCT 36.0 01/21/2019   MCV 78.6 01/21/2019   PLT 327.0 01/21/2019   Lab Results  Component Value Date   IRON 28 (L) 01/21/2019   Attestation Statements:   This is the patient's first visit at [facility]. The patient's NEW PATIENT PACKET was reviewed at length. Included in the packet: current and past health history, medications, allergies, ROS, gynecologic history (women only), surgical history, family history, social history, weight history, weight loss surgery history (for those that have had weight loss surgery), nutritional evaluation, mood and food questionnaire, PHQ9, Epworth questionnaire, sleep habits questionnaire, patient life and health improvement goals questionnaire. These will all be scanned into the patient's chart under media.   During the visit, I independently reviewed the patient's EKG, bioimpedance scale results, and indirect calorimeter  results. I used this information to tailor a meal plan for the patient that will help her to lose weight and will improve her obesity-related conditions going forward. I performed a medically necessary appropriate examination and/or evaluation. I discussed the assessment and treatment plan with the patient. The patient was provided an opportunity to ask questions and all were answered. The patient agreed with the plan and demonstrated an understanding of the instructions. Labs were ordered at this visit and will be reviewed at the next visit unless more critical results need to be addressed immediately. Clinical information was updated and documented in the EMR.   Time spent on visit including pre-visit chart review and post-visit care was 45 minutes.   A separate 15 minutes was spent on risk counseling (see above).    I, Trixie Dredge, am acting as transcriptionist for Ilene Qua, MD.  I have reviewed the above documentation for accuracy and completeness, and I agree with the above. - Ilene Qua, MD

## 2020-02-10 ENCOUNTER — Encounter (INDEPENDENT_AMBULATORY_CARE_PROVIDER_SITE_OTHER): Payer: Self-pay | Admitting: Family Medicine

## 2020-02-10 LAB — HEMOGLOBIN A1C
Est. average glucose Bld gHb Est-mCnc: 128 mg/dL
Hgb A1c MFr Bld: 6.1 % — ABNORMAL HIGH (ref 4.8–5.6)

## 2020-02-10 LAB — COMPREHENSIVE METABOLIC PANEL
ALT: 12 IU/L (ref 0–32)
AST: 15 IU/L (ref 0–40)
Albumin/Globulin Ratio: 1.2 (ref 1.2–2.2)
Albumin: 3.9 g/dL (ref 3.9–5.0)
Alkaline Phosphatase: 88 IU/L (ref 39–117)
BUN/Creatinine Ratio: 13 (ref 9–23)
BUN: 8 mg/dL (ref 6–20)
Bilirubin Total: <0.2 mg/dL (ref 0.0–1.2)
CO2: 22 mmol/L (ref 20–29)
Calcium: 8.9 mg/dL (ref 8.7–10.2)
Chloride: 105 mmol/L (ref 96–106)
Creatinine, Ser: 0.6 mg/dL (ref 0.57–1.00)
GFR calc Af Amer: 149 mL/min/{1.73_m2} (ref >59)
GFR calc non Af Amer: 129 mL/min/{1.73_m2} (ref >59)
Globulin, Total: 3.3 g/dL (ref 1.5–4.5)
Glucose: 99 mg/dL (ref 65–99)
Potassium: 4.5 mmol/L (ref 3.5–5.2)
Sodium: 139 mmol/L (ref 134–144)
Total Protein: 7.2 g/dL (ref 6.0–8.5)

## 2020-02-10 LAB — LIPID PANEL WITH LDL/HDL RATIO
Cholesterol, Total: 218 mg/dL — ABNORMAL HIGH (ref 100–199)
HDL: 45 mg/dL (ref >39)
LDL Chol Calc (NIH): 157 mg/dL — ABNORMAL HIGH (ref 0–99)
LDL/HDL Ratio: 3.5 ratio — ABNORMAL HIGH (ref 0.0–3.2)
Triglycerides: 88 mg/dL (ref 0–149)
VLDL Cholesterol Cal: 16 mg/dL (ref 5–40)

## 2020-02-10 LAB — CBC WITH DIFFERENTIAL/PLATELET
Basophils Absolute: 0 10*3/uL (ref 0.0–0.2)
Basos: 1 %
EOS (ABSOLUTE): 0.1 10*3/uL (ref 0.0–0.4)
Eos: 2 %
Hemoglobin: 11.9 g/dL (ref 11.1–15.9)
Immature Grans (Abs): 0 10*3/uL (ref 0.0–0.1)
Immature Granulocytes: 0 %
Lymphocytes Absolute: 2.3 10*3/uL (ref 0.7–3.1)
Lymphs: 37 %
MCH: 25.4 pg — ABNORMAL LOW (ref 26.6–33.0)
MCHC: 32.6 g/dL (ref 31.5–35.7)
MCV: 78 fL — ABNORMAL LOW (ref 79–97)
Monocytes Absolute: 0.7 10*3/uL (ref 0.1–0.9)
Monocytes: 11 %
Neutrophils Absolute: 3.1 10*3/uL (ref 1.4–7.0)
Neutrophils: 49 %
Platelets: 341 10*3/uL (ref 150–450)
RBC: 4.69 x10E6/uL (ref 3.77–5.28)
RDW: 15.3 % (ref 11.7–15.4)
WBC: 6.4 10*3/uL (ref 3.4–10.8)

## 2020-02-10 LAB — ANEMIA PANEL
Ferritin: 9 ng/mL — ABNORMAL LOW (ref 15–150)
Folate, Hemolysate: 290 ng/mL
Folate, RBC: 795 ng/mL (ref >498)
Hematocrit: 36.5 % (ref 34.0–46.6)
Iron Saturation: 5 % — CL (ref 15–55)
Iron: 21 ug/dL — ABNORMAL LOW (ref 27–159)
Retic Ct Pct: 1.1 % (ref 0.6–2.6)
Total Iron Binding Capacity: 400 ug/dL (ref 250–450)
UIBC: 379 ug/dL (ref 131–425)
Vitamin B-12: 698 pg/mL (ref 232–1245)

## 2020-02-10 LAB — TSH: TSH: 3.17 u[IU]/mL (ref 0.450–4.500)

## 2020-02-10 LAB — T4, FREE: Free T4: 1.18 ng/dL (ref 0.82–1.77)

## 2020-02-10 LAB — INSULIN, RANDOM: INSULIN: 49.9 u[IU]/mL — ABNORMAL HIGH (ref 2.6–24.9)

## 2020-02-10 LAB — FOLATE: Folate: 8.8 ng/mL (ref >3.0)

## 2020-02-10 LAB — T3: T3, Total: 143 ng/dL (ref 71–180)

## 2020-02-10 LAB — VITAMIN D 25 HYDROXY (VIT D DEFICIENCY, FRACTURES): Vit D, 25-Hydroxy: 6.5 ng/mL — ABNORMAL LOW (ref 30.0–100.0)

## 2020-02-11 ENCOUNTER — Telehealth (HOSPITAL_COMMUNITY): Payer: Self-pay | Admitting: Licensed Clinical Social Worker

## 2020-02-11 ENCOUNTER — Ambulatory Visit (HOSPITAL_COMMUNITY): Payer: BC Managed Care – PPO | Admitting: Licensed Clinical Social Worker

## 2020-02-11 ENCOUNTER — Other Ambulatory Visit: Payer: Self-pay

## 2020-02-11 NOTE — Telephone Encounter (Signed)
Pt did not present for her individual webex session. Sent email reminder. She did not join session. Danel Requena, lcas

## 2020-02-18 ENCOUNTER — Other Ambulatory Visit: Payer: Self-pay

## 2020-02-18 ENCOUNTER — Telehealth (HOSPITAL_COMMUNITY): Payer: Self-pay | Admitting: Licensed Clinical Social Worker

## 2020-02-18 ENCOUNTER — Ambulatory Visit (HOSPITAL_COMMUNITY): Payer: BC Managed Care – PPO | Admitting: Licensed Clinical Social Worker

## 2020-02-18 NOTE — Telephone Encounter (Signed)
Pt did not present for her webex individual appointment. Emailed patient reminder. She did not join session. Desirae Mancusi, LCAS

## 2020-02-19 ENCOUNTER — Other Ambulatory Visit (HOSPITAL_COMMUNITY): Payer: Self-pay | Admitting: Psychiatry

## 2020-02-23 ENCOUNTER — Ambulatory Visit (INDEPENDENT_AMBULATORY_CARE_PROVIDER_SITE_OTHER): Payer: Self-pay | Admitting: Family Medicine

## 2020-02-25 ENCOUNTER — Ambulatory Visit (HOSPITAL_COMMUNITY): Payer: BC Managed Care – PPO | Admitting: Licensed Clinical Social Worker

## 2020-03-08 ENCOUNTER — Other Ambulatory Visit: Payer: Self-pay

## 2020-03-08 ENCOUNTER — Ambulatory Visit (INDEPENDENT_AMBULATORY_CARE_PROVIDER_SITE_OTHER): Payer: BC Managed Care – PPO | Admitting: Bariatrics

## 2020-03-08 ENCOUNTER — Encounter (INDEPENDENT_AMBULATORY_CARE_PROVIDER_SITE_OTHER): Payer: Self-pay | Admitting: Bariatrics

## 2020-03-08 VITALS — BP 122/80 | HR 71 | Temp 98.2°F | Ht 67.0 in | Wt 281.0 lb

## 2020-03-08 DIAGNOSIS — E7849 Other hyperlipidemia: Secondary | ICD-10-CM

## 2020-03-08 DIAGNOSIS — R7303 Prediabetes: Secondary | ICD-10-CM

## 2020-03-08 DIAGNOSIS — Z9189 Other specified personal risk factors, not elsewhere classified: Secondary | ICD-10-CM | POA: Diagnosis not present

## 2020-03-08 DIAGNOSIS — R790 Abnormal level of blood mineral: Secondary | ICD-10-CM

## 2020-03-08 DIAGNOSIS — E559 Vitamin D deficiency, unspecified: Secondary | ICD-10-CM | POA: Diagnosis not present

## 2020-03-08 DIAGNOSIS — Z6841 Body Mass Index (BMI) 40.0 and over, adult: Secondary | ICD-10-CM

## 2020-03-08 MED ORDER — VITAMIN D (ERGOCALCIFEROL) 1.25 MG (50000 UNIT) PO CAPS: 50000.0000 [IU] | ORAL_CAPSULE | ORAL | 0 refills | Status: DC

## 2020-03-08 MED ORDER — METFORMIN HCL 500 MG PO TABS: 500.0000 mg | ORAL_TABLET | Freq: Every day | ORAL | 0 refills | Status: DC

## 2020-03-08 MED ORDER — POLYSACCHARIDE IRON COMPLEX 150 MG PO CAPS: 150.0000 mg | ORAL_CAPSULE | Freq: Every day | ORAL | 0 refills | Status: AC

## 2020-03-08 NOTE — Progress Notes (Signed)
Chief Complaint:   OBESITY Annette Molina is here to discuss her progress with her obesity treatment plan along with follow-up of her obesity related diagnoses. Annette Molina is on the Category 2 Plan and states she is following her eating plan approximately 30% of the time. Annette Molina states she is exercising 0 minutes 0 times per week.  Today's visit was #: 2 Starting weight: 278 lbs Starting date: 02/09/2020 Today's weight: 281 lbs Today's date: 03/08/2020 Total lbs lost to date: 0 Total lbs lost since last in-office visit: 0  Interim History: Annette Molina felt that it was difficult to consume all of the protein on the Category 2 meal plan. She is trying to drink more water daily. She estimates missing breakfast 2-3 times per week.   Subjective:   Prediabetes. Annette Molina has a diagnosis of prediabetes based on her elevated HgA1c and was informed this puts her at greater risk of developing diabetes. She continues to work on diet and exercise to decrease her risk of diabetes. She denies nausea or hypoglycemia. Annette Molina has been told about prediabetes in the past and she has a family history of diabetes. She reports increased appetite, especially in the evening.  Lab Results  Component Value Date   HGBA1C 6.1 (H) 02/09/2020   Lab Results  Component Value Date   INSULIN 49.9 (H) 02/09/2020   Low ferritin. Ferritin 9 on 02/09/2020 with iron saturation of 5. Annette Molina is not on oral ferrous sulfate, however, she has been diagnosed with anemia in the past. She reports heavy menses.  Vitamin D deficiency. Last Vitamin D 6.5 on 02/09/2020. Annette Molina is not on Vitamin D supplementation.  Other hyperlipidemia. Annette Molina has hyperlipidemia and has been trying to improve her cholesterol levels with intensive lifestyle modification including a low saturated fat diet, exercise and weight loss. She denies any chest pain, claudication or myalgias. Annette Molina is not on statin therapy. She is unaware of family  history of hypercholesterolemia.  Lab Results  Component Value Date   ALT 12 02/09/2020   AST 15 02/09/2020   ALKPHOS 88 02/09/2020   BILITOT <0.2 02/09/2020   Lab Results  Component Value Date   CHOL 218 (H) 02/09/2020   HDL 45 02/09/2020   LDLCALC 157 (H) 02/09/2020   TRIG 88 02/09/2020   CHOLHDL 5 08/26/2018   At risk for osteoporosis. Annette Molina is at higher risk of osteopenia and osteoporosis due to Vitamin D deficiency and obesity.   Assessment/Plan:   Prediabetes. Annette Molina will continue to work on weight loss, exercise, and decreasing simple carbohydrates to help decrease the risk of diabetes. She will start metFORMIN (GLUCOPHAGE) 500 MG tablet with lunch #30 withi 0 refills. She will continue her Category 2 meal plan alternating with the Pescatarian plan.  Low ferritin. Annette Molina will start iron polysaccharides (NU-IRON) 150 MG capsule daily and will have labs checked in 3 months.  Vitamin D deficiency. Low Vitamin D level contributes to fatigue and are associated with obesity, breast, and colon cancer. She will start Vitamin D, Ergocalciferol, (DRISDOL) 1.25 MG (50000 UNIT) CAPS capsule every week #4 with 0 refills and will follow-up for routine testing of Vitamin D in 3 months.    Other hyperlipidemia. Cardiovascular risk and specific lipid/LDL goals reviewed.  We discussed several lifestyle modifications today and Annette Molina will continue to work on diet, exercise and weight loss efforts. Orders and follow up as documented in patient record. She will continue the Category 2 meal plan alternating with the Pescatarian plan. She will have  lipids checked in 3 months.  Counseling Intensive lifestyle modifications are the first line treatment for this issue. . Dietary changes: Increase soluble fiber. Decrease simple carbohydrates. . Exercise changes: Moderate to vigorous-intensity aerobic activity 150 minutes per week if tolerated. . Lipid-lowering medications: see documented in medical  record.  At risk for osteoporosis. Annette Molina was given approximately 30 minutes of osteoporosis prevention counseling today. Annette Molina is at risk for osteopenia and osteoporosis due to her Vitamin D deficiency. She was encouraged to take her Vitamin D and follow her higher calcium diet and increase strengthening exercise to help strengthen her bones and decrease her risk of osteopenia and osteoporosis.  Repetitive spaced learning was employed today to elicit superior memory formation and behavioral change.  Class 3 severe obesity with serious comorbidity and body mass index (BMI) of 40.0 to 44.9 in adult, unspecified obesity type (HCC).  Annette Molina is currently in the action stage of change. As such, her goal is to continue with weight loss efforts. She has agreed to the Category 2 Plan alternating with the Pescatarian plan.   We independently reviewed with the patient labs including CMP, lipids, anemia panel, Vitamin D, Vitamin B12, CBC, A1c, insulin, and thyroid panel.  Exercise goals: Annette Molina will start walking 60 minutes 3-4 days a week.  Behavioral modification strategies: increasing lean protein intake, decreasing simple carbohydrates, increasing vegetables, increasing water intake, decreasing eating out, no skipping meals, meal planning and cooking strategies, keeping healthy foods in the home and planning for success.  Annette Molina has agreed to follow-up with our clinic in 4 weeks. She was informed of the importance of frequent follow-up visits to maximize her success with intensive lifestyle modifications for her multiple health conditions.   Objective:   Blood pressure 122/80, pulse 71, temperature 98.2 F (36.8 C), height 5\' 7"  (1.702 m), weight 281 lb (127.5 kg), last menstrual period 02/02/2020, SpO2 99 %. Body mass index is 44.01 kg/m.  General: Cooperative, alert, well developed, in no acute distress. HEENT: Conjunctivae and lids unremarkable. Cardiovascular: Regular rhythm.  Lungs:  Normal work of breathing. Neurologic: No focal deficits.   Lab Results  Component Value Date   CREATININE 0.60 02/09/2020   BUN 8 02/09/2020   NA 139 02/09/2020   K 4.5 02/09/2020   CL 105 02/09/2020   CO2 22 02/09/2020   Lab Results  Component Value Date   ALT 12 02/09/2020   AST 15 02/09/2020   ALKPHOS 88 02/09/2020   BILITOT <0.2 02/09/2020   Lab Results  Component Value Date   HGBA1C 6.1 (H) 02/09/2020   HGBA1C 6.4 08/26/2018   Lab Results  Component Value Date   INSULIN 49.9 (H) 02/09/2020   Lab Results  Component Value Date   TSH 3.170 02/09/2020   Lab Results  Component Value Date   CHOL 218 (H) 02/09/2020   HDL 45 02/09/2020   LDLCALC 157 (H) 02/09/2020   TRIG 88 02/09/2020   CHOLHDL 5 08/26/2018   Lab Results  Component Value Date   WBC 6.4 02/09/2020   HGB 11.9 02/09/2020   HCT 36.5 02/09/2020   MCV 78 (L) 02/09/2020   PLT 341 02/09/2020   Lab Results  Component Value Date   IRON 21 (L) 02/09/2020   TIBC 400 02/09/2020   FERRITIN 9 (L) 02/09/2020   Attestation Statements:   Reviewed by clinician on day of visit: allergies, medications, problem list, medical history, surgical history, family history, social history, and previous encounter notes.  02/11/2020, am acting  as transcriptionist for Jearld Lesch, DO   I have reviewed the above documentation for accuracy and completeness, and I agree with the above. Jearld Lesch, DO

## 2020-03-15 ENCOUNTER — Ambulatory Visit (INDEPENDENT_AMBULATORY_CARE_PROVIDER_SITE_OTHER): Payer: BC Managed Care – PPO | Admitting: Psychiatry

## 2020-03-15 ENCOUNTER — Other Ambulatory Visit: Payer: Self-pay

## 2020-03-15 DIAGNOSIS — F3181 Bipolar II disorder: Secondary | ICD-10-CM | POA: Diagnosis not present

## 2020-03-15 DIAGNOSIS — F902 Attention-deficit hyperactivity disorder, combined type: Secondary | ICD-10-CM

## 2020-03-15 DIAGNOSIS — F410 Panic disorder [episodic paroxysmal anxiety] without agoraphobia: Secondary | ICD-10-CM

## 2020-03-15 MED ORDER — LAMOTRIGINE 200 MG PO TABS: ORAL_TABLET | ORAL | 0 refills | Status: DC

## 2020-03-15 NOTE — Progress Notes (Signed)
BH MD/PA/NP OP Progress Note  03/15/2020 11:15 AM Annette Molina  MRN:  [id number] Interview was conducted by phone and I verified that I was speaking with the correct person using two identifiers. I discussed the limitations of evaluation and management by telemedicine and  the availability of in person appointments. Patient expressed understanding and agreed to proceed.  Chief Complaint: "I am OK but I need to resume therapy sessions".  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 She also hadfrequentpanic attacks with no identifiable triggers. She reports that her mood swings occur multiple times within a day and are not in response to environmental triggers/stressors. She denies feeling hopeless or suicidal. She has no hx of inpatient psychiatric admissions. She has been diagnosed with ADHD at age 24and was on Adderall and Strattera in the past. She found the formermuch morehelpful. She was later diagnosed with bipolar 2 disorder and has been recently under care of [facility]) who has tried her on [employer]. She became more depressed with passive SI in early October and entered [facility] on 10/14 (completed 10/30). She reports feelingmore depressed, anxious, grieving loss of her grandmother in early January (she practically raised [name]). Some problems with sleep for which she takes melatonin.Adderallwas restarted(now at 15 mg bid) forproblems with focusing.[name] has not been ever tried on any mood stabilizers (lithium, divalproate, carbamazepine, lamotrigine) but was briefly on quetiapine which she also found ineffective (only 50 mg though).Abilify 15mg  "helps some" but she still has mood fluctuations.We started lamotrigine titration and added clonazepam prn anxiety but she has not picked the latter prescription and admits  to stopping Abilify as well. She run out of Adderall more than a week ago and does not feel that she needs to take it at this time. She missed counseling appointment with Binnie Rail because she was at a funeral and wants to resume sessions.    Visit Diagnosis:    ICD-10-CM   1. Bipolar 2 disorder (HCC)  F31.81   2. Attention deficit hyperactivity disorder (ADHD), combined type  F90.2   3. Panic disorder  F41.0     Past Psychiatric History: Please see intake H&P.  Past Medical History:  Past Medical History:  Diagnosis Date  . ADHD (attention deficit hyperactivity disorder)   . Anxiety   . Depression   . Precocious puberty    Was followed by endocrinology.  . Pseudotumor cerebri     Past Surgical History:  Procedure Laterality Date  . NO PAST SURGERIES      Family Psychiatric History: None.  Family History:  Family History  Problem Relation Age of Onset  . Hypertension Mother   . Heart disease Mother   . Obesity Mother   . Diabetes Father   . Heart disease Father   . Cancer Neg Hx     Social History:  Social History   Socioeconomic History  . Marital status: Single    Spouse name: Not on file  . Number of children: 0  . Years of education: Not on file  . Highest education level: Not on file  Occupational History  . Occupation: Ship broker    Comment: [employer] - business  Tobacco Use  . Smoking status: Never Smoker  . Smokeless tobacco: Never Used  Substance and Sexual Activity  . Alcohol use: No    Alcohol/week: 0.0 standard drinks  . Drug use: No  .  Sexual activity: Not on file  Other Topics Concern  . Not on file  Social History Narrative   Caffeine Use:  3 daily   Regular Exercise:  2-3 x weekly   Lives with Mom step dad and brother   Attends [employer].   Patient is the granddaughter of [name]         Social Determinants of Health   Financial Resource Strain:   . Difficulty of Paying Living Expenses:   Food Insecurity:   . Worried About  Programme researcher, broadcasting/film/video in the Last Year:   . Barista in the Last Year:   Transportation Needs:   . Freight forwarder (Medical):   Marland Kitchen Lack of Transportation (Non-Medical):   Physical Activity:   . Days of Exercise per Week:   . Minutes of Exercise per Session:   Stress:   . Feeling of Stress :   Social Connections:   . Frequency of Communication with Friends and Family:   . Frequency of Social Gatherings with Friends and Family:   . Attends Religious Services:   . Active Member of Clubs or Organizations:   . Attends Banker Meetings:   Marland Kitchen Marital Status:     Allergies:  Allergies  Allergen Reactions  . Lupron [Leuprolide Acetate]     Was allergic to a preservative in the Lupron.    Metabolic Disorder Labs: Lab Results  Component Value Date   HGBA1C 6.1 (H) 02/09/2020   No results found for: PROLACTIN Lab Results  Component Value Date   CHOL 218 (H) 02/09/2020   TRIG 88 02/09/2020   HDL 45 02/09/2020   CHOLHDL 5 08/26/2018   VLDL 29.2 08/26/2018   LDLCALC 157 (H) 02/09/2020   LDLCALC 105 (H) 08/26/2018   Lab Results  Component Value Date   TSH 3.170 02/09/2020   TSH 1.97 01/21/2019    Therapeutic Level Labs: No results found for: LITHIUM No results found for: VALPROATE No components found for:  CBMZ  Current Medications: Current Outpatient Medications  Medication Sig Dispense Refill  . amphetamine-dextroamphetamine (ADDERALL) 15 MG tablet Take 1 tablet by mouth 2 (two) times daily. 60 tablet 0  . amphetamine-dextroamphetamine (ADDERALL) 15 MG tablet Take 1 tablet by mouth 2 (two) times daily. 60 tablet 0  . amphetamine-dextroamphetamine (ADDERALL) 15 MG tablet Take 1 tablet by mouth 2 (two) times daily. 60 tablet 0  . iron polysaccharides (NU-IRON) 150 MG capsule Take 1 capsule (150 mg total) by mouth daily. 30 capsule 0  . lamoTRIgine (LAMICTAL) 200 MG tablet Take 0.5 tablets (100 mg total) by mouth at bedtime for 10 days, THEN 1 tablet  (200 mg total) at bedtime. 65 tablet 0  . metFORMIN (GLUCOPHAGE) 500 MG tablet Take 1 tablet (500 mg total) by mouth daily with lunch. 30 tablet 0  . Vitamin D, Ergocalciferol, (DRISDOL) 1.25 MG (50000 UNIT) CAPS capsule Take 1 capsule (50,000 Units total) by mouth every 7 (seven) days. 4 capsule 0   No current facility-administered medications for this visit.    Psychiatric Specialty Exam: Review of Systems  All other systems reviewed and are negative.   There were no vitals taken for this visit.There is no height or weight on file to calculate BMI.  General Appearance: NA  Eye Contact:  NA  Speech:  Clear and Coherent and Normal Rate  Volume:  Normal  Mood:  Variable.  Affect:  NA  Thought Process:  Goal Directed and Linear  Orientation:  Full (Time, Place, and Person)  Thought Content: Logical   Suicidal Thoughts:  No  Homicidal Thoughts:  No  Memory:  Immediate;   Good Recent;   Good Remote;   Good  Judgement:  Fair  Insight:  Fair  Psychomotor Activity:  NA  Concentration:  Concentration: Fair  Recall:  Good  Fund of Knowledge: Good  Language: Good  Akathisia:  Negative  Handed:  Right  AIMS (if indicated): not done  Assets:  Communication Skills Desire for Improvement Financial Resources/Insurance Housing Resilience  ADL's:  Intact  Cognition: WNL  Sleep:  Fair   Screenings: GAD-7     Office Visit from 04/29/2019 in [facility]  Total GAD-7 Score  14    PHQ2-9     Office Visit from 02/09/2020 in [facility] Office Visit from 04/29/2019 in [facility] Office Visit from 08/26/2018 in [facility] Office Visit from 11/10/2016 in [facility]  PHQ-2 Total Score  2  4  0  4  PHQ-9 Total Score  7  13  1  10        Assessment and Plan: 24yo single AAF a student at 10 year hx of  rapid mood fluctuations, racing thoughts, insomnia, anger/irritability and problems with focusing/atttemtion. She also hadfrequentpanic attacks with no identifiable triggers. She reports that her mood swings occur multiple times within a day and are not in response to environmental triggers/stressors. She denies feeling hopeless or suicidal. She has no hx of inpatient psychiatric admissions. She has been diagnosed with ADHD at age 14and was on Adderall and Strattera in the past. She found the formermuch morehelpful. She was later diagnosed with bipolar 2 disorder and has been recently under care of [facility]) who has tried her on [facility]. She became more depressed with passive SI in early October and entered [facility] on 10/14 (completed 10/30). She reports feelingmore depressed, anxious, grieving loss of her grandmother in early January (she practically raised [name]). Some problems with sleep for which she takes melatonin.Adderallwas restarted(now at 15 mg bid) forproblems with focusing.February has not been ever tried on any mood stabilizers (lithium, divalproate, carbamazepine, lamotrigine) but was briefly on quetiapine which she also found ineffective (only 50 mg though).Abilify 15mg  "helps some" but she still has mood fluctuations.We started lamotrigine titration and added clonazepam prn anxiety but she has not picked the latter prescription and admits to stopping Abilify as well. She run out of Adderall more than a week ago and does not feel that she needs to take it at this time. She missed counseling appointment with [name] because she was at a funeral and wants to resume sessions.    Dx: Bipolar 2 disorder rapid cycling;ADHD;Panic disorder; Bereavement  Plan: We will increase Lamictal (in two weeks once she completed 50 mg dose) to 100 mg x 10 days then to 200 mg at HS - target dose. We will hold off on resuming Adderall. She will call asking for new counseling appointment. Next appointment in two months.The plan was discussed with patient who had an opportunity to ask questions and these were all answered. I spend20 minutes inphone consultationwith the patient.    Idalia Needle, MD 03/15/2020, 11:15 AM

## 2020-03-29 ENCOUNTER — Other Ambulatory Visit (HOSPITAL_COMMUNITY): Payer: Self-pay | Admitting: Psychiatry

## 2020-04-05 ENCOUNTER — Ambulatory Visit (INDEPENDENT_AMBULATORY_CARE_PROVIDER_SITE_OTHER): Payer: BC Managed Care – PPO | Admitting: Bariatrics

## 2020-04-06 ENCOUNTER — Telehealth (INDEPENDENT_AMBULATORY_CARE_PROVIDER_SITE_OTHER): Payer: BC Managed Care – PPO | Admitting: Physician Assistant

## 2020-04-06 ENCOUNTER — Other Ambulatory Visit: Payer: Self-pay

## 2020-04-06 ENCOUNTER — Encounter: Payer: Self-pay | Admitting: Physician Assistant

## 2020-04-06 DIAGNOSIS — J0110 Acute frontal sinusitis, unspecified: Secondary | ICD-10-CM | POA: Diagnosis not present

## 2020-04-06 MED ORDER — AMOXICILLIN-POT CLAVULANATE 875-125 MG PO TABS: 1.0000 | ORAL_TABLET | Freq: Two times a day (BID) | ORAL | 0 refills | Status: DC

## 2020-04-06 NOTE — Progress Notes (Signed)
I have discussed the procedure for the virtual visit with the patient who has given consent to proceed with assessment and treatment. Patient is unable to obtain vital signs.  Annette Molina N Sarabella Caprio, LPN     

## 2020-04-06 NOTE — Progress Notes (Signed)
Virtual Visit via Video   I connected with patient on 04/06/20 at  1:30 PM EDT by a video enabled telemedicine application and verified that I am speaking with the correct person using two identifiers.  Location patient: Home Location provider: [place], Office Persons participating in the virtual visit: Patient, Provider, CMA (Patina Moore)  I discussed the limitations of evaluation and management by telemedicine and the availability of in person appointments. The patient expressed understanding and agreed to proceed.  Subjective:   HPI:   Patient presents via [facility] today c/o greater than 1 week of sinus pressure, sinus pain, nasal congestion and postnasal drip.  Notes some associated fatigue.  Did have her Covid vaccine a few days ago, but notes tolerating well and does not feel this is contributing since she already had symptoms prevaccine.  Patient denies fever, chills, chest pain or shortness of breath.  Denies loss of taste or smell or GI symptoms.  Denies recent travel or sick contact.  Has taken Tylenol with only some relief of symptoms.   ROS:   See pertinent positives and negatives per HPI.  Patient Active Problem List   Diagnosis Date Noted  . Attention deficit hyperactivity disorder (ADHD), combined type 08/26/2019  . Panic disorder 08/26/2019  . 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 Mood disorder (HCC) 05/20/2019  . Piriformis syndrome of right side 05/20/2019  . Visit for preventive health examination 08/26/2018  . Screening-pulmonary TB 08/26/2018  . Left ankle injury, subsequent encounter 02/27/2018  . Allergic rhinitis 12/03/2017  . Obesity 02/15/2016  . Pseudotumor cerebri 12/13/2015  . Hyperpigmentation 11/17/2015  . Bipolar 2 disorder (HCC) 05/27/2015  . Dysmenorrhea in the adolescent 07/04/2011    Social History   Tobacco Use  . Smoking status: Never Smoker  . Smokeless tobacco: Never Used  Substance Use Topics  . Alcohol use: No     Alcohol/week: 0.0 standard drinks    Current Outpatient Medications:  .  iron polysaccharides (NU-IRON) 150 MG capsule, Take 1 capsule (150 mg total) by mouth daily., Disp: 30 capsule, Rfl: 0 .  lamoTRIgine (LAMICTAL) 200 MG tablet, Take 0.5 tablets (100 mg total) by mouth at bedtime for 10 days, THEN 1 tablet (200 mg total) at bedtime., Disp: 65 tablet, Rfl: 0 .  amphetamine-dextroamphetamine (ADDERALL) 15 MG tablet, Take 1 tablet by mouth 2 (two) times daily., Disp: 60 tablet, Rfl: 0 .  amphetamine-dextroamphetamine (ADDERALL) 15 MG tablet, Take 1 tablet by mouth 2 (two) times daily., Disp: 60 tablet, Rfl: 0 .  metFORMIN (GLUCOPHAGE) 500 MG tablet, Take 1 tablet (500 mg total) by mouth daily with lunch. (Patient not taking: Reported on 04/06/2020), Disp: 30 tablet, Rfl: 0 .  Vitamin D, Ergocalciferol, (DRISDOL) 1.25 MG (50000 UNIT) CAPS capsule, Take 1 capsule (50,000 Units total) by mouth every 7 (seven) days., Disp: 4 capsule, Rfl: 0  Allergies  Allergen Reactions  . Lupron [Leuprolide Acetate]     Was allergic to a preservative in the Lupron.    Objective:   There were no vitals taken for this visit.  Patient is well-developed, well-nourished in no acute distress.  Resting comfortably at home.  Head is normocephalic, atraumatic.  No labored breathing.  Speech is clear and coherent with logical content.  Patient is alert and oriented at baseline.   Assessment and Plan:   1. Acute non-recurrent frontal sinusitis Discussed with patient this could be viral or bacterial, with viral infections responsible for about 90+ percent of sinus infections.  Will start  supportive measures.  OTC medications reviewed.  Giving the fact that she mentioned symptoms are continually getting worse, will give her a prescription for antibiotic to cover for bacterial etiology, especially since I am unable to evaluate her in office currently.  Rx Augmentin twice daily x7 days.  Nasacort OTC.  Start saline  nasal rinses.  Follow-up if symptoms or not resolving.    Leeanne Rio, PA-C 04/06/2020

## 2020-04-06 NOTE — Patient Instructions (Signed)
Instructions sent to MyChart

## 2020-04-08 ENCOUNTER — Ambulatory Visit (INDEPENDENT_AMBULATORY_CARE_PROVIDER_SITE_OTHER): Payer: BC Managed Care – PPO | Admitting: Bariatrics

## 2020-05-19 ENCOUNTER — Other Ambulatory Visit: Payer: Self-pay

## 2020-05-19 ENCOUNTER — Telehealth (INDEPENDENT_AMBULATORY_CARE_PROVIDER_SITE_OTHER): Payer: BC Managed Care – PPO | Admitting: Psychiatry

## 2020-05-19 DIAGNOSIS — F5101 Primary insomnia: Secondary | ICD-10-CM

## 2020-05-19 DIAGNOSIS — F3181 Bipolar II disorder: Secondary | ICD-10-CM | POA: Diagnosis not present

## 2020-05-19 DIAGNOSIS — F902 Attention-deficit hyperactivity disorder, combined type: Secondary | ICD-10-CM | POA: Diagnosis not present

## 2020-05-19 MED ORDER — AMPHETAMINE-DEXTROAMPHETAMINE 15 MG PO TABS: 15.0000 mg | ORAL_TABLET | Freq: Every day | ORAL | 0 refills | Status: DC

## 2020-05-19 MED ORDER — TRAZODONE HCL 50 MG PO TABS: 50.0000 mg | ORAL_TABLET | Freq: Every evening | ORAL | 1 refills | Status: DC | PRN

## 2020-05-19 MED ORDER — LAMOTRIGINE 25 MG PO TABS: ORAL_TABLET | ORAL | 0 refills | Status: DC

## 2020-05-19 NOTE — Progress Notes (Signed)
BH MD/PA/NP OP Progress Note  05/19/2020 3:57 PM Annette Molina  MRN:  [id number] Interview was conducted using videoconferencing application and I verified that I was speaking with the correct person using two identifiers. I discussed the limitations of evaluation and management by telemedicine and  the availability of in person appointments. Patient expressed understanding and agreed to proceed. Patient location - home; physician - home office.  Chief Complaint: Mood up and down, lack of focus, middle insomnia.  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 She also hadfrequentpanic attacks with no identifiable triggers. She reports that her mood swings occur multiple times within a day and are not in response to environmental triggers/stressors. She denies feeling hopeless or suicidal. She has no hx of inpatient psychiatric admissions. She has been diagnosed with ADHD at age 14and was on Adderall and Strattera in the past. She found the formermuch morehelpful. She was later diagnosed with bipolar 2 disorder and has been recently under care of Annette Molina [facility]) who has tried her on [employer]. She became more depressed with passive SI in early October and entered [facility] on 10/14 (completed 10/30). She reports feelingmore depressed, anxious, grieving loss of her grandmother in early January (she practically raised Annette Molina). Some problems with sleep for which she takes melatonin but wakes up after 2-3 hours.Adderallwas restarted(now at 15 mg bid) forproblems with focusing.Annette Molina has not been ever tried on any mood stabilizers (lithium, divalproate, carbamazepine, lamotrigine) but was briefly on quetiapine which she also found ineffective (only 50 mg though).Abilify 15mg  "helps some" but she still has mood fluctuations.We started  lamotrigine titration and added clonazepam prn anxiety but she has not picked the latter prescription and admits to stopping Abilify as well. She run out of Adderall some time in April and did not call to have it continued. She missed counseling appointment with Annette Molina because she was at a funeral and wants to resume sessions. I advised her to call the office but she forgot to do that. She also forgot to take Lamictal and has been off of it for over a month now. It appears that her focusing and STM are not good without Adderall.     Visit Diagnosis:    ICD-10-CM   1. Bipolar 2 disorder (HCC)  F31.81   2. Attention deficit hyperactivity disorder (ADHD), combined type  F90.2   3. Primary insomnia  F51.01     Past Psychiatric History: Please see intake H&P.  Past Medical History:  Past Medical History:  Diagnosis Date  . ADHD (attention deficit hyperactivity disorder)   . Anxiety   . Depression   . Precocious puberty    Was followed by endocrinology.  . Pseudotumor cerebri     Past Surgical History:  Procedure Laterality Date  . NO PAST SURGERIES      Family Psychiatric History: None  Family History:  Family History  Problem Relation Age of Onset  . Hypertension Mother   . Heart disease Mother   . Obesity Mother   . Diabetes Father   . Heart disease Father   . Cancer Neg Hx     Social History:  Social History   Socioeconomic History  . Marital status: Single    Spouse name: Not on file  . Number of children: 0  . Years of education: Not on file  . Highest education level: Not on file  Occupational History  . 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 Smoking status: Never Smoker  . Smokeless tobacco: Never Used  Vaping Use  . Vaping Use: Never used  Substance and Sexual Activity  . Alcohol use: No    Alcohol/week: 0.0 standard drinks  . Drug use: No  . Sexual activity: Not on file  Other Topics Concern  . Not on file  Social  History Narrative   Caffeine Use:  3 daily   Regular Exercise:  2-3 x weekly   Lives with Mom step dad and brother   Attends [employer].   Patient is the granddaughter of Annette Molina         Social Determinants of Health   Financial Resource Strain:   . Difficulty of Paying Living Expenses:   Food Insecurity:   . Worried About Programme researcher, broadcasting/film/video in the Last Year:   . Barista in the Last Year:   Transportation Needs:   . Freight forwarder (Medical):   Marland Kitchen Lack of Transportation (Non-Medical):   Physical Activity:   . Days of Exercise per Week:   . Minutes of Exercise per Session:   Stress:   . Feeling of Stress :   Social Connections:   . Frequency of Communication with Friends and Family:   . Frequency of Social Gatherings with Friends and Family:   . Attends Religious Services:   . Active Member of Clubs or Organizations:   . Attends Banker Meetings:   Marland Kitchen Marital Status:     Allergies:  Allergies  Allergen Reactions  . Lupron [Leuprolide Acetate]     Was allergic to a preservative in the Lupron.    Metabolic Disorder Labs: Lab Results  Component Value Date   HGBA1C 6.1 (H) 02/09/2020   No results found for: PROLACTIN Lab Results  Component Value Date   CHOL 218 (H) 02/09/2020   TRIG 88 02/09/2020   HDL 45 02/09/2020   CHOLHDL 5 08/26/2018   VLDL 29.2 08/26/2018   LDLCALC 157 (H) 02/09/2020   LDLCALC 105 (H) 08/26/2018   Lab Results  Component Value Date   TSH 3.170 02/09/2020   TSH 1.97 01/21/2019    Therapeutic Level Labs: No results found for: LITHIUM No results found for: VALPROATE No components found for:  CBMZ  Current Medications: Current Outpatient Medications  Medication Sig Dispense Refill  . amoxicillin-clavulanate (AUGMENTIN) 875-125 MG tablet Take 1 tablet by mouth 2 (two) times daily. 14 tablet 0  . amphetamine-dextroamphetamine (ADDERALL) 15 MG tablet Take 1 tablet by mouth 2 (two) times daily. 60 tablet 0  .  amphetamine-dextroamphetamine (ADDERALL) 15 MG tablet Take 1 tablet by mouth 2 (two) times daily. 60 tablet 0  . amphetamine-dextroamphetamine (ADDERALL) 15 MG tablet Take 1 tablet by mouth daily before breakfast. 30 tablet 0  . [START ON 06/18/2020] amphetamine-dextroamphetamine (ADDERALL) 15 MG tablet Take 1 tablet by mouth daily before breakfast. 30 tablet 0  . [START ON 07/18/2020] amphetamine-dextroamphetamine (ADDERALL) 15 MG tablet Take 1 tablet by mouth daily before breakfast. 30 tablet 0  . iron polysaccharides (NU-IRON) 150 MG capsule Take 1 capsule (150 mg total) by mouth daily. 30 capsule 0  . lamoTRIgine (LAMICTAL) 25 MG tablet Take 1 tablet (25 mg total) by mouth at bedtime for 14 days, THEN 2 tablets (50 mg total) at bedtime for 14 days, THEN 4 tablets (100 mg total) at bedtime for 20 days. 122 tablet  0  . metFORMIN (GLUCOPHAGE) 500 MG tablet Take 1 tablet (500 mg total) by mouth daily with lunch. (Patient not taking: Reported on 04/06/2020) 30 tablet 0  . traZODone (DESYREL) 50 MG tablet Take 1 tablet (50 mg total) by mouth at bedtime as needed for sleep. 30 tablet 1  . Vitamin D, Ergocalciferol, (DRISDOL) 1.25 MG (50000 UNIT) CAPS capsule Take 1 capsule (50,000 Units total) by mouth every 7 (seven) days. 4 capsule 0   No current facility-administered medications for this visit.    Psychiatric Specialty Exam: Review of Systems  Psychiatric/Behavioral: Positive for decreased concentration and sleep disturbance. The patient is nervous/anxious.   All other systems reviewed and are negative.   There were no vitals taken for this visit.There is no height or weight on file to calculate BMI.  General Appearance: Casual and Fairly Groomed  Eye Contact:  Good  Speech:  Clear and Coherent and Normal Rate  Volume:  Normal  Mood:  Irritable  Affect:  Full Range  Thought Process:  Goal Directed  Orientation:  Full (Time, Place, and Person)  Thought Content: Logical   Suicidal Thoughts:   No  Homicidal Thoughts:  No  Memory:  Immediate;   Fair Recent;   Fair Remote;   Good  Judgement:  Fair  Insight:  Fair  Psychomotor Activity:  Normal  Concentration:  Concentration: Fair  Recall:  [employer] of Knowledge: Fair  Language: Good  Akathisia:  Negative  Handed:  Right  AIMS (if indicated): not done  Assets:  Desire for Improvement Financial Resources/Insurance Housing Social Support  ADL's:  Intact  Cognition: WNL  Sleep:  Fair   Screenings: GAD-7     Office Visit from 04/29/2019 in [facility]  Total GAD-7 Score 14    PHQ2-9     Office Visit from 02/09/2020 in [facility] Office Visit from 04/29/2019 in [facility] Office Visit from 08/26/2018 in [facility] Office Visit from 11/10/2016 in [facility]  PHQ-2 Total Score 2 4 0 4  PHQ-9 Total Score 7 13 1 10        Assessment and Plan: 24yo single AAF a student at 10 year hx of rapid mood fluctuations, racing thoughts, insomnia, anger/irritability and problems with focusing/atttemtion. She also hadfrequentpanic attacks with no identifiable triggers. She reports that her mood swings occur multiple times within a day and are not in response to environmental triggers/stressors. She denies feeling hopeless or suicidal. She has no hx of inpatient psychiatric admissions. She has been diagnosed with ADHD at age 14and was on Adderall and Strattera in the past. She found the formermuch morehelpful. She was later diagnosed with bipolar 2 disorder and has been recently under care of [employer] [facility]) who has tried her on [facility]. She became more depressed with passive SI in early October and entered [facility] on 10/14 (completed 10/30). She reports feelingmore depressed, anxious, grieving loss of her  grandmother in early January (she practically raised Annette Molina). Some problems with sleep for which she takes melatonin but wakes up after 2-3 hours.Adderallwas restarted(now at 15 mg bid) forproblems with focusing.February has not been ever tried on any mood stabilizers (lithium, divalproate, carbamazepine, lamotrigine) but was briefly on quetiapine which she also found ineffective (only 50 mg though).Abilify 15mg  "helps some" but she still has mood fluctuations.We started lamotrigine titration and added clonazepam prn anxiety but she has not  picked the latter prescription and admits to stopping Abilify as well. She run out of Adderall some time in April and did not call to have it continued. She missed counseling appointment with Binnie Rail because she was at a funeral and wants to resume sessions. I advised her to call the office but she forgot to do that. She also forgot to take Lamictal and has been off of it for over a month now. It appears that her focusing and STM are not good without Adderall.    Dx: Bipolar 2 disorder rapid cycling;ADHD;Panic disorder  Plan: We will restart Lamictal titration with target dose of 150-200 mg at HS. We will resume Addearll but at 15 mg in AM only and add trazodone 50 mg for middle insomnia. She was again encouraged to call the office for a new counseling appointment. Next appointment in5 weeks.The plan was discussed with patient who had an opportunity to ask questions and these were all answered. I spend77minutes invideoconferencing with the patient.    Stephanie Acre, MD 05/19/2020, 3:57 PM

## 2020-06-02 ENCOUNTER — Other Ambulatory Visit (HOSPITAL_COMMUNITY): Payer: Self-pay | Admitting: Psychiatry

## 2020-06-03 ENCOUNTER — Other Ambulatory Visit (INDEPENDENT_AMBULATORY_CARE_PROVIDER_SITE_OTHER): Payer: Self-pay | Admitting: Physician Assistant

## 2020-06-03 ENCOUNTER — Encounter: Payer: Self-pay | Admitting: Physician Assistant

## 2020-06-03 ENCOUNTER — Other Ambulatory Visit: Payer: Self-pay

## 2020-06-03 ENCOUNTER — Ambulatory Visit (INDEPENDENT_AMBULATORY_CARE_PROVIDER_SITE_OTHER): Payer: BC Managed Care – PPO | Admitting: Physician Assistant

## 2020-06-03 VITALS — BP 110/80 | HR 94 | Temp 98.0°F | Resp 16 | Ht 67.0 in | Wt 286.0 lb

## 2020-06-03 DIAGNOSIS — W57XXXA Bitten or stung by nonvenomous insect and other nonvenomous arthropods, initial encounter: Secondary | ICD-10-CM

## 2020-06-03 DIAGNOSIS — S80869A Insect bite (nonvenomous), unspecified lower leg, initial encounter: Secondary | ICD-10-CM | POA: Diagnosis not present

## 2020-06-03 DIAGNOSIS — E559 Vitamin D deficiency, unspecified: Secondary | ICD-10-CM

## 2020-06-03 DIAGNOSIS — R790 Abnormal level of blood mineral: Secondary | ICD-10-CM

## 2020-06-03 MED ORDER — TRIAMCINOLONE ACETONIDE 0.1 % EX CREA: 1.0000 "application " | TOPICAL_CREAM | Freq: Two times a day (BID) | CUTANEOUS | 0 refills | Status: DC

## 2020-06-03 MED ORDER — HYDROXYZINE HCL 10 MG PO TABS: 10.0000 mg | ORAL_TABLET | Freq: Three times a day (TID) | ORAL | 0 refills | Status: DC | PRN

## 2020-06-03 NOTE — Patient Instructions (Signed)
Please wash bed linens well and re-wash clothing you took while hiking.  Store for a week, re-wash before using again.  Use the Kenalog cream twice daily for itch. OTC Sarna lotion or witch hazel are also great for this.  The hydroxyzine can be used up to three times daily for itch.  Form this point forward I would not expect you to get any new areas. If you do, let me know and we will start Elimite cream.   Hang in there!

## 2020-06-03 NOTE — Telephone Encounter (Signed)
Patient seen today. Reports not taking Vitamin D on 06/03/20 and initially prescribed by Verdene Rio. The ferrex is not on ;the patient's current med list. Please advise.

## 2020-06-03 NOTE — Telephone Encounter (Signed)
Hi Ms. Annette Molina,   I did not see this patient today, but [name], PA-C. Please send this request to him.   Thank you

## 2020-06-03 NOTE — Progress Notes (Signed)
Patient presents to clinic today c/o 6 days of pruritic bumps of her feet and lower legs, starting after going to [place]' club and going hiking. Denies bumps elsewhere. Notes they aee very itch but do not hurt. Denies drainage or tenderness. Denies fever, chills, malaise or fatigue. Denies change to hygiene products or detergents. Denies contact with similar symptoms.   Past Medical History:  Diagnosis Date  . ADHD (attention deficit hyperactivity disorder)   . Anxiety   . Depression   . Precocious puberty    Was followed by endocrinology.  . Pseudotumor cerebri     Current Outpatient Medications on File Prior to Visit  Medication Sig Dispense Refill  . amphetamine-dextroamphetamine (ADDERALL) 15 MG tablet Take 1 tablet by mouth daily before breakfast. 30 tablet 0  . [START ON 06/18/2020] amphetamine-dextroamphetamine (ADDERALL) 15 MG tablet Take 1 tablet by mouth daily before breakfast. 30 tablet 0  . [START ON 07/18/2020] amphetamine-dextroamphetamine (ADDERALL) 15 MG tablet Take 1 tablet by mouth daily before breakfast. 30 tablet 0  . iron polysaccharides (NU-IRON) 150 MG capsule Take 1 capsule (150 mg total) by mouth daily. (Patient not taking: Reported on 06/03/2020) 30 capsule 0  . Vitamin D, Ergocalciferol, (DRISDOL) 1.25 MG (50000 UNIT) CAPS capsule Take 1 capsule (50,000 Units total) by mouth every 7 (seven) days. (Patient not taking: Reported on 06/03/2020) 4 capsule 0   No current facility-administered medications on file prior to visit.    Allergies  Allergen Reactions  . Lupron [Leuprolide Acetate]     Was allergic to a preservative in the Lupron.    Family History  Problem Relation Age of Onset  . Hypertension Mother   . Heart disease Mother   . Obesity Mother   . Diabetes Father   . Heart disease Father   . Cancer Neg Hx     Social History   Socioeconomic History  . Marital status: Single    Spouse name: Not on file  . Number of children: 0  . Years of  education: Not on file  . Highest education level: Not on file  Occupational History  . 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 Smoking status: Never Smoker  . Smokeless tobacco: Never Used  Vaping Use  . Vaping Use: Never used  Substance and Sexual Activity  . Alcohol use: No    Alcohol/week: 0.0 standard drinks  . Drug use: No  . Sexual activity: Not on file  Other Topics Concern  . Not on file  Social History Narrative   Caffeine Use:  3 daily   Regular Exercise:  2-3 x weekly   Lives with Mom step dad and brother   Attends [employer].   Patient is the granddaughter of [name]         Social Determinants of Health   Financial Resource Strain:   . Difficulty of Paying Living Expenses:   Food Insecurity:   . Worried About Programme researcher, broadcasting/film/video in the Last Year:   . Barista in the Last Year:   Transportation Needs:   . Freight forwarder (Medical):   Marland Kitchen Lack of Transportation (Non-Medical):   Physical Activity:   . Days of Exercise per Week:   . Minutes of Exercise per Session:   Stress:   . Feeling of Stress :   Social Connections:   . Frequency of Communication with Friends and Family:   . Frequency of Social Gatherings with  Friends and Family:   . Attends Religious Services:   . Active Member of Clubs or Organizations:   . Attends Banker Meetings:   Marland Kitchen Marital Status:    Review of Systems - See HPI.  All other ROS are negative.  BP 110/80   Pulse 94   Temp 98 F (36.7 C) (Temporal)   Resp 16   Ht 5\' 7"  (1.702 m)   Wt 286 lb (129.7 kg)   SpO2 98%   BMI 44.79 kg/m   Physical Exam Vitals reviewed.  Constitutional:      Appearance: Normal appearance.  HENT:     Head: Normocephalic and atraumatic.  Pulmonary:     Effort: Pulmonary effort is normal.  Musculoskeletal:     Cervical back: Neck supple.  Skin:    Comments: Scattered papular lesions of feet and lower legs bilaterally, sparing soles of feet.  Concerning for mite bites  Neurological:     General: No focal deficit present.     Mental Status: She is alert and oriented to person, place, and time.  Psychiatric:        Mood and Affect: Mood normal.    Assessment/Plan: 1. Insect bite of lower leg, unspecified laterality, initial encounter Unspecified etiology. Seems most indicative of flea bites. Start hydroxyzine as directed. OTC Sarna and witch hazel recommended. Rx Kenalog to apply BID. Recommended washing of clothing she wore hiking and linens. Store in air tight contained for a week before washing again and reusing. No pets in the home.   - hydrOXYzine (ATARAX/VISTARIL) 10 MG tablet; Take 1 tablet (10 mg total) by mouth 3 (three) times daily as needed.  Dispense: 30 tablet; Refill: 0 - triamcinolone cream (KENALOG) 0.1 %; Apply 1 application topically 2 (two) times daily.  Dispense: 30 g; Refill: 0  This visit occurred during the SARS-CoV-2 public health emergency.  Safety protocols were in place, including screening questions prior to the visit, additional usage of staff PPE, and extensive cleaning of exam room while observing appropriate contact time as indicated for disinfecting solutions.     , PA-C

## 2020-06-15 ENCOUNTER — Encounter: Payer: Self-pay | Admitting: Physician Assistant

## 2020-06-15 ENCOUNTER — Telehealth (INDEPENDENT_AMBULATORY_CARE_PROVIDER_SITE_OTHER): Payer: BC Managed Care – PPO | Admitting: Physician Assistant

## 2020-06-15 ENCOUNTER — Other Ambulatory Visit: Payer: Self-pay

## 2020-06-15 DIAGNOSIS — Z91038 Other insect allergy status: Secondary | ICD-10-CM | POA: Diagnosis not present

## 2020-06-15 MED ORDER — PERMETHRIN 5 % EX CREA: 1.0000 "application " | TOPICAL_CREAM | Freq: Once | CUTANEOUS | 0 refills | Status: AC

## 2020-06-15 MED ORDER — PREDNISONE 10 MG PO TABS: ORAL_TABLET | ORAL | 0 refills | Status: AC

## 2020-06-15 NOTE — Progress Notes (Signed)
I have discussed the procedure for the virtual visit with the patient who has given consent to proceed with assessment and treatment.   Pt said the iron and vitamin D was  Never sent to the pharmacy. Rash never went away from last visit with PCP. Was on ankles and feet, now on hands and forearms.   Geannie Risen, CMA

## 2020-06-15 NOTE — Progress Notes (Signed)
Virtual Visit via Video   I connected with patient on 06/15/20 at  1:00 PM EDT by a video enabled telemedicine application and verified that I am speaking with the correct person using two identifiers.  Location patient: Home Location provider: [facility], Office Persons participating in the virtual visit: Patient, Provider, CMA (Patina Moore)  I discussed the limitations of evaluation and management by telemedicine and the availability of in person appointments. The patient expressed understanding and agreed to proceed.  Subjective:   HPI:   Patient presents via [facility] today c/o new rash of skin over the past few days. Notes symptoms starting after going to a cookout and having to walk through very grassy areas. Noted initially having pruritic bites/bumps on lower extremities which spread to upper extremities and torso over a 24 hour period. Denies fever, chills, malaise or fatigue. Denies contact with anyone having similar symptoms. .  ROS:   See pertinent positives and negatives per HPI.  Patient Active Problem List   Diagnosis Date Noted  . Attention deficit hyperactivity disorder (ADHD), combined type 08/26/2019  . Panic disorder 08/26/2019  . 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 Mood disorder (HCC) 05/20/2019  . Piriformis syndrome of right side 05/20/2019  . Visit for preventive health examination 08/26/2018  . Screening-pulmonary TB 08/26/2018  . Left ankle injury, subsequent encounter 02/27/2018  . Allergic rhinitis 12/03/2017  . Obesity 02/15/2016  . Pseudotumor cerebri 12/13/2015  . Hyperpigmentation 11/17/2015  . Bipolar 2 disorder (HCC) 05/27/2015  . Dysmenorrhea in the adolescent 07/04/2011    Social History   Tobacco Use  . Smoking status: Never Smoker  . Smokeless tobacco: Never Used  Substance Use Topics  . Alcohol use: No    Alcohol/week: 0.0 standard drinks    Current Outpatient Medications:  .  amphetamine-dextroamphetamine (ADDERALL) 15  MG tablet, Take 1 tablet by mouth daily before breakfast., Disp: 30 tablet, Rfl: 0 .  [START ON 06/18/2020] amphetamine-dextroamphetamine (ADDERALL) 15 MG tablet, Take 1 tablet by mouth daily before breakfast., Disp: 30 tablet, Rfl: 0 .  [START ON 07/18/2020] amphetamine-dextroamphetamine (ADDERALL) 15 MG tablet, Take 1 tablet by mouth daily before breakfast., Disp: 30 tablet, Rfl: 0 .  hydrOXYzine (ATARAX/VISTARIL) 10 MG tablet, Take 1 tablet (10 mg total) by mouth 3 (three) times daily as needed., Disp: 30 tablet, Rfl: 0 .  iron polysaccharides (NU-IRON) 150 MG capsule, Take 1 capsule (150 mg total) by mouth daily., Disp: 30 capsule, Rfl: 0 .  traZODone (DESYREL) 50 MG tablet, TAKE 1 TABLET (50 MG TOTAL) BY MOUTH AT BEDTIME AS NEEDED FOR SLEEP., Disp: , Rfl:  .  triamcinolone cream (KENALOG) 0.1 %, Apply 1 application topically 2 (two) times daily., Disp: 30 g, Rfl: 0 .  Vitamin D, Ergocalciferol, (DRISDOL) 1.25 MG (50000 UNIT) CAPS capsule, Take 1 capsule (50,000 Units total) by mouth every 7 (seven) days., Disp: 4 capsule, Rfl: 0 .  permethrin (ELIMITE) 5 % cream, Apply 1 application topically once for 1 dose. Wash off in the morning., Disp: 60 g, Rfl: 0 .  predniSONE (DELTASONE) 10 MG tablet, Take 4 tablets (40 mg total) by mouth daily with breakfast for 2 days, THEN 3 tablets (30 mg total) daily with breakfast for 2 days, THEN 2 tablets (20 mg total) daily with breakfast for 2 days, THEN 1 tablet (10 mg total) daily with breakfast for 2 days., Disp: 20 tablet, Rfl: 0  Allergies  Allergen Reactions  . Lupron [Leuprolide Acetate]     Was allergic to  a preservative in the Lupron.    Objective:   There were no vitals taken for this visit.  Patient is well-developed, well-nourished in no acute distress.  Resting comfortably at home.  Head is normocephalic, atraumatic.  No labored breathing.  Speech is clear and coherent with logical content.  Patient is alert and oriented at baseline.    Scattered maculopapular bites noted of upper and lower extremities including hands bilateral, sparing palmar surface. Similar lesions of torso noted by patient.   Assessment and Plan:   1. Allergic to insect bites Bites beginning after going through a grassy area. Start Elimite cream x 1 dose. Rx Prednisone taper due to severity of itching. Home measures reviewed. If not resolving will need in-office evaluation.  - permethrin (ELIMITE) 5 % cream; Apply 1 application topically once for 1 dose. Wash off in the morning.  Dispense: 60 g; Refill: 0 - predniSONE (DELTASONE) 10 MG tablet; Take 4 tablets (40 mg total) by mouth daily with breakfast for 2 days, THEN 3 tablets (30 mg total) daily with breakfast for 2 days, THEN 2 tablets (20 mg total) daily with breakfast for 2 days, THEN 1 tablet (10 mg total) daily with breakfast for 2 days.  Dispense: 20 tablet; Refill: 0 .   [name], PA-C 06/15/2020

## 2020-07-05 ENCOUNTER — Other Ambulatory Visit: Payer: Self-pay

## 2020-07-05 ENCOUNTER — Telehealth (INDEPENDENT_AMBULATORY_CARE_PROVIDER_SITE_OTHER): Payer: BC Managed Care – PPO | Admitting: Psychiatry

## 2020-07-05 DIAGNOSIS — F902 Attention-deficit hyperactivity disorder, combined type: Secondary | ICD-10-CM

## 2020-07-05 MED ORDER — AMPHETAMINE-DEXTROAMPHETAMINE 20 MG PO TABS
20.0000 mg | ORAL_TABLET | Freq: Two times a day (BID) | ORAL | 0 refills | Status: DC
Start: 2020-08-04 — End: 2020-11-16

## 2020-07-05 MED ORDER — AMPHETAMINE-DEXTROAMPHETAMINE 20 MG PO TABS
20.0000 mg | ORAL_TABLET | Freq: Two times a day (BID) | ORAL | 0 refills | Status: DC
Start: 2020-09-03 — End: 2020-11-16

## 2020-07-05 MED ORDER — AMPHETAMINE-DEXTROAMPHETAMINE 20 MG PO TABS
20.0000 mg | ORAL_TABLET | Freq: Two times a day (BID) | ORAL | 0 refills | Status: DC
Start: 2020-07-05 — End: 2020-11-16

## 2020-07-05 NOTE — Progress Notes (Signed)
BH MD/PA/NP OP Progress Note  07/05/2020 2:47 PM Annette Molina  MRN:  [id number] Interview was conducted by phone and I verified that I was speaking with the correct person using two identifiers. I discussed the limitations of evaluation and management by telemedicine and  the availability of in person appointments. Patient expressed understanding and agreed to proceed. Patient location - home; physician - home office.  Chief Complaint: Problems with attention.  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 She also hadfrequentpanic attacks with no identifiable triggers. She has been diagnosed with ADHD at age 14and was on Adderall and Strattera in the past. She found the formermuch morehelpful. She waslaterdiagnosed with bipolar 2 disorder and has been recently under care of Annette HoyerLucy Molina [facility]) who has tried her on Latudathen Abilify. She became more depressed with passive SI in early October and entered [facility] on 10/14 (completed 10/30). She reports feelingmore depressed, anxious, grieving loss of her grandmother in early January (she practically raised Annette Molina). Some problems with sleep for which she takes melatonin.Adderallwas  at 15 mg bid forproblems with focusing.Annette LefevreJessy has not been ever tried on any mood stabilizers (lithium, divalproate, carbamazepine, lamotrigine) but was briefly on quetiapine which she also found ineffective (only 50 mg though).Abilify 15mg  "helps some" but she still has mood fluctuations.We startedlamotrigine titration and added clonazepam prn anxiety but she has not picked the latterprescriptionand admits to stopping Abilify as well. She run out of Adderall some time in April and did not call to have it continued. She missed counseling appointment with Annette NeedleBeth Molina because she was at a funeral and wants to  resume sessions. I advised her to call the office but she forgot to do that. She also forgot to take Lamictal and has been off of it for over two months now. No worsening of mood swings reported though. It appears that her focusing and STM are not good without Adderall so we restarted 15 mg daily but focusing is still suboptimal.   Visit Diagnosis:    ICD-10-CM   1. Attention deficit hyperactivity disorder (ADHD), combined type  F90.2     Past Psychiatric History: Please see intake H&P.  Past Medical History:  Past Medical History:  Diagnosis Date  . ADHD (attention deficit hyperactivity disorder)   . Anxiety   . Depression   . Precocious puberty    Was followed by endocrinology.  . Pseudotumor cerebri     Past Surgical History:  Procedure Laterality Date  . NO PAST SURGERIES      Family Psychiatric History: None.  Family History:  Family History  Problem Relation Age of Onset  . Hypertension Mother   . Heart disease Mother   . Obesity Mother   . Diabetes Father   . Heart disease Father   . Cancer Neg Hx     Social History:  Social History   Socioeconomic History  . Marital status: Single    Spouse name: Not on file  . Number of children: 0  . Years of education: Not on file  . Highest education level: Not on file  Occupational History  . 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 Smoking status: Never Smoker  . Smokeless tobacco: Never Used  Vaping Use  . Vaping Use: Never used  Substance and Sexual Activity  . Alcohol use: No    Alcohol/week: 0.0  standard drinks  . Drug use: No  . Sexual activity: Not on file  Other Topics Concern  . Not on file  Social History Narrative   Caffeine Use:  3 daily   Regular Exercise:  2-3 x weekly   Lives with Mom step dad and brother   Attends [employer].   Patient is the granddaughter of Annette Molina         Social Determinants of Health   Financial Resource Strain:   . Difficulty of Paying  Living Expenses:   Food Insecurity:   . Worried About Programme researcher, broadcasting/film/video in the Last Year:   . Barista in the Last Year:   Transportation Needs:   . Freight forwarder (Medical):   Marland Kitchen Lack of Transportation (Non-Medical):   Physical Activity:   . Days of Exercise per Week:   . Minutes of Exercise per Session:   Stress:   . Feeling of Stress :   Social Connections:   . Frequency of Communication with Friends and Family:   . Frequency of Social Gatherings with Friends and Family:   . Attends Religious Services:   . Active Member of Clubs or Organizations:   . Attends Banker Meetings:   Marland Kitchen Marital Status:     Allergies:  Allergies  Allergen Reactions  . Lupron [Leuprolide Acetate]     Was allergic to a preservative in the Lupron.    Metabolic Disorder Labs: Lab Results  Component Value Date   HGBA1C 6.1 (H) 02/09/2020   No results found for: PROLACTIN Lab Results  Component Value Date   CHOL 218 (H) 02/09/2020   TRIG 88 02/09/2020   HDL 45 02/09/2020   CHOLHDL 5 08/26/2018   VLDL 29.2 08/26/2018   LDLCALC 157 (H) 02/09/2020   LDLCALC 105 (H) 08/26/2018   Lab Results  Component Value Date   TSH 3.170 02/09/2020   TSH 1.97 01/21/2019    Therapeutic Level Labs: No results found for: LITHIUM No results found for: VALPROATE No components found for:  CBMZ  Current Medications: Current Outpatient Medications  Medication Sig Dispense Refill  . amphetamine-dextroamphetamine (ADDERALL) 20 MG tablet Take 1 tablet (20 mg total) by mouth 2 (two) times daily with breakfast and lunch. 60 tablet 0  . [START ON 08/04/2020] amphetamine-dextroamphetamine (ADDERALL) 20 MG tablet Take 1 tablet (20 mg total) by mouth 2 (two) times daily with breakfast and lunch. 60 tablet 0  . [START ON 09/03/2020] amphetamine-dextroamphetamine (ADDERALL) 20 MG tablet Take 1 tablet (20 mg total) by mouth 2 (two) times daily with breakfast and lunch. 60 tablet 0  . hydrOXYzine  (ATARAX/VISTARIL) 10 MG tablet Take 1 tablet (10 mg total) by mouth 3 (three) times daily as needed. 30 tablet 0  . iron polysaccharides (NU-IRON) 150 MG capsule Take 1 capsule (150 mg total) by mouth daily. 30 capsule 0  . traZODone (DESYREL) 50 MG tablet TAKE 1 TABLET (50 MG TOTAL) BY MOUTH AT BEDTIME AS NEEDED FOR SLEEP.    Marland Kitchen triamcinolone cream (KENALOG) 0.1 % Apply 1 application topically 2 (two) times daily. 30 g 0  . Vitamin D, Ergocalciferol, (DRISDOL) 1.25 MG (50000 UNIT) CAPS capsule Take 1 capsule (50,000 Units total) by mouth every 7 (seven) days. 4 capsule 0   No current facility-administered medications for this visit.     Psychiatric Specialty Exam: Review of Systems  Psychiatric/Behavioral: Positive for decreased concentration and sleep disturbance.  All other systems reviewed and are negative.  There were no vitals taken for this visit.There is no height or weight on file to calculate BMI.  General Appearance: NA  Eye Contact:  NA  Speech:  Clear and Coherent and Normal Rate  Volume:  Normal  Mood:  Varies  Affect:  NA  Thought Process:  Goal Directed  Orientation:  Full (Time, Place, and Person)  Thought Content: Logical   Suicidal Thoughts:  No  Homicidal Thoughts:  No  Memory:  Immediate;   Fair Recent;   Fair Remote;   Good  Judgement:  Good  Insight:  Fair  Psychomotor Activity:  NA  Concentration:  Concentration: Fair  Recall:  Fair  Fund of Knowledge: Good  Language: Good  Akathisia:  Negative  Handed:  Right  AIMS (if indicated): not done  Assets:  Communication Skills Desire for Improvement Financial Resources/Insurance Housing Talents/Skills  ADL's:  Intact  Cognition: WNL  Sleep:  Fair   Screenings: GAD-7     Office Visit from 04/29/2019 in [facility]  Total GAD-7 Score 14    PHQ2-9     Video Visit from 06/15/2020 in [facility] Office Visit from 02/09/2020  in [facility] Office Visit from 04/29/2019 in [facility] Office Visit from 08/26/2018 in [facility] Office Visit from 11/10/2016 in [facility]  PHQ-2 Total Score 0 2 4 0 4  PHQ-9 Total Score - 7 13 1 10        Assessment and Plan:  24yo single AAF a student at 10 year hx of rapid mood fluctuations, racing thoughts, insomnia, anger/irritability and problems with focusing/atttemtion. She also hadfrequentpanic attacks with no identifiable triggers. She has been diagnosed with ADHD at age 14and was on Adderall and Strattera in the past. She found the formermuch morehelpful. She waslaterdiagnosed with bipolar 2 disorder and has been recently under care of [facility]) who has tried her on Latudathen Abilify. She became more depressed with passive SI in early October and entered [facility] on 10/14 (completed 10/30). She reports feelingmore depressed, anxious, grieving loss of her grandmother in early January (she practically raised Annette Molina). Some problems with sleep for which she takes melatonin.Adderallwas  at 15 mg bid forproblems with focusing.February has not been ever tried on any mood stabilizers (lithium, divalproate, carbamazepine, lamotrigine) but was briefly on quetiapine which she also found ineffective (only 50 mg though).Abilify 15mg  "helps some" but she still has mood fluctuations.We startedlamotrigine titration and added clonazepam prn anxiety but she has not picked the latterprescriptionand admits to stopping Abilify as well. She run out of Adderall some time in April and did not call to have it continued. She missed counseling appointment with because she was at a funeral and wants to resume sessions. I advised her to call the office but she forgot to do that. She also forgot to take Lamictal  and has been off of it for over two months now. No worsening of mood swings reported though. It appears that her focusing and STM are not good without Adderall so we restarted 15 mg daily but focusing is still suboptimal.   Dx: ADHD;Panic disorderhx of  Plan: We willincrease Addearll to 20 mg bid. Next appointment in7 weeks.The plan was discussed with patient who had an opportunity to ask questions and these were all answered. I spend32minutes inphone consultation with the patient.    Annette Needle, MD  07/05/2020, 2:47 PM

## 2020-07-06 ENCOUNTER — Encounter: Payer: Self-pay | Admitting: Emergency Medicine

## 2020-07-06 ENCOUNTER — Telehealth: Payer: Self-pay | Admitting: Physician Assistant

## 2020-07-06 NOTE — Telephone Encounter (Signed)
My chart message sent to patient to review and call who she chooses

## 2020-07-06 NOTE — Telephone Encounter (Signed)
Please call patient with list of places that she can go for counceling.

## 2020-07-19 ENCOUNTER — Telehealth: Payer: Self-pay | Admitting: Physician Assistant

## 2020-07-19 NOTE — Telephone Encounter (Signed)
Pt called in asking to speak with Cody's CMA about labs results from 2020, she states that she is just seeing them and have questions. When I asked which results she was referring to she said there were a few.   Please call pt back at the home #

## 2020-07-20 NOTE — Telephone Encounter (Signed)
She wanted to know what Bacterial vaginitis was, I explained to her what it was. She expressed understanding.

## 2020-07-21 NOTE — Telephone Encounter (Signed)
Spoke with patient and advised of questions about results from 2020. Made aware of what could cause BV. She is agreeable and she was treated.

## 2020-07-28 ENCOUNTER — Telehealth (INDEPENDENT_AMBULATORY_CARE_PROVIDER_SITE_OTHER): Payer: BC Managed Care – PPO | Admitting: Family Medicine

## 2020-07-28 ENCOUNTER — Encounter: Payer: Self-pay | Admitting: Family Medicine

## 2020-07-28 ENCOUNTER — Other Ambulatory Visit: Payer: Self-pay

## 2020-07-28 DIAGNOSIS — J302 Other seasonal allergic rhinitis: Secondary | ICD-10-CM

## 2020-07-28 DIAGNOSIS — J029 Acute pharyngitis, unspecified: Secondary | ICD-10-CM | POA: Diagnosis not present

## 2020-07-28 NOTE — Progress Notes (Signed)
Virtual Visit via Video Note  Subjective  CC:  Chief Complaint  Patient presents with  . Sore Throat and Stuffy Nose    x2-3 days. She took otc dayquil and cough drops the first day and it went away but came back. No pain with swallowing. Patient has been tested for covid twice both results came back negative.     Same day acute visit; PCP not available. New pt to me. Chart reviewed.   I connected with Annette Molina on 07/28/20 at  2:00 PM EDT by a video enabled telemedicine application and verified that I am speaking with the correct person using two identifiers. Location patient: Home Location provider: [facility] [address], Office Persons participating in the virtual visit: Annette Molina, Willow Ora, MD Adela Glimpse CMA  I discussed the limitations of evaluation and management by telemedicine and the availability of in person appointments. The patient expressed understanding and agreed to proceed. HPI: Annette Molina is a 24 y.o. female who was contacted today to address the problems listed above in the chief complaint. . 24 year old complains of 2 to 3-day history of runny nose, sneezing and mild sore throat with postnasal drainage.  As noted above, she did take COVID test twice, both results are negative.  She denies fevers, significant cough, chest pain, shortness of breath, nausea, vomiting, diarrhea or myalgias.  She has been walking outside more due to her new job.  She does suffer from allergic rhinitis.  No sick contacts. He there may Assessment  1. Seasonal allergic rhinitis, unspecified trigger   2. Sore throat      Plan   ST and nasal congestion:  Appears well. Could be mild viral URI or AR. Discussed with patient. Continue decongestant, add advil and zyrtec and monitor. Let us know if worsens. Should do well.    I discussed the assessment and treatment plan with the patient. The patient was provided an opportunity to  ask questions and all were answered. The patient agreed with the plan and demonstrated an understanding of the instructions.   The patient was advised to call back or seek an in-person evaluation if the symptoms worsen or if the condition fails to improve as anticipated. Follow up: No follow-ups on file.  Visit date not found  No orders of the defined types were placed in this encounter.     I reviewed the patients updated PMH, FH, and SocHx.    Patient Active Problem List   Diagnosis Date Noted  . Attention deficit hyperactivity disorder (ADHD), combined type 08/26/2019  . Panic disorder 08/26/2019  . 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 Mood disorder (HCC) 05/20/2019  . Piriformis syndrome of right side 05/20/2019  . Visit for preventive health examination 08/26/2018  . Screening-pulmonary TB 08/26/2018  . Left ankle injury, subsequent encounter 02/27/2018  . Allergic rhinitis 12/03/2017  . Obesity 02/15/2016  . Pseudotumor cerebri 12/13/2015  . Hyperpigmentation 11/17/2015  . Bipolar 2 disorder (HCC) 05/27/2015  . Dysmenorrhea in the adolescent 07/04/2011   Current Meds  Medication Sig  . amphetamine-dextroamphetamine (ADDERALL) 20 MG tablet Take 1 tablet (20 mg total) by mouth 2 (two) times daily with breakfast and lunch.  Melene Muller ON 08/04/2020] amphetamine-dextroamphetamine (ADDERALL) 20 MG tablet Take 1 tablet (20 mg total) by mouth 2 (two) times daily with breakfast and lunch.  Melene Muller ON 09/03/2020] amphetamine-dextroamphetamine (ADDERALL) 20 MG tablet Take 1 tablet (20 mg total) by mouth 2 (two) times daily with  breakfast and lunch.  . iron polysaccharides (NU-IRON) 150 MG capsule Take 1 capsule (150 mg total) by mouth daily.    Allergies: Patient is allergic to lupron [leuprolide acetate]. Family History: Patient family history includes Diabetes in her father; Heart disease in her father and mother; Hypertension in her mother; Obesity in her mother. Social History:    Patient  reports that she has never smoked. She has never used smokeless tobacco. She reports that she does not drink alcohol and does not use drugs.  Review of Systems: Constitutional: Negative for fever malaise or anorexia Cardiovascular: negative for chest pain Respiratory: negative for SOB or persistent cough Gastrointestinal: negative for abdominal pain  OBJECTIVE Vitals: There were no vitals taken for this visit. General: no acute distress , A&Ox3, appears well.  Normal speech. No congestion or cough noted  Willow Ora, MD

## 2020-08-24 ENCOUNTER — Other Ambulatory Visit: Payer: Self-pay

## 2020-08-24 ENCOUNTER — Telehealth (INDEPENDENT_AMBULATORY_CARE_PROVIDER_SITE_OTHER): Payer: BC Managed Care – PPO | Admitting: Psychiatry

## 2020-08-24 DIAGNOSIS — F988 Other specified behavioral and emotional disorders with onset usually occurring in childhood and adolescence: Secondary | ICD-10-CM | POA: Diagnosis not present

## 2020-08-24 NOTE — Progress Notes (Signed)
BH MD/PA/NP OP Progress Note  08/24/2020 1:38 PM Annette Molina  MRN:  [id number] Interview was conducted by phone and I verified that I was speaking with the correct person using two identifiers. I discussed the limitations of evaluation and management by telemedicine and  the availability of in person appointments. Patient expressed understanding and agreed to proceed. Patient location - home; physician - home office.  Chief Complaint: "I didn't have money to pick up Adderall as yet".  HPI: 24yo single AAF witha hx of rapid mood fluctuations, racing thoughts, insomnia, anger/irritability and problems with focusing/atttemtion. She also hadfrequentpanic attacks with no identifiable triggers. She has been diagnosed with ADHD at age 14and was on Adderall and Strattera in the past. She found the formermuch morehelpful. She waslatersuspected of having bipolar 2 disorder and was tried on Latudathen Abilify then Lamictal. She became more depressed with passive SI in early October 2020 and entered [facility] on 10/14 (completed 10/30).  She run out of Adderall some time in April and did not call to have it continued. She also stopped taking Abilify and Lamictal. She has been off them for over 3 months and she denies having mood swings. She missed counseling appointment with Idalia Needle because she was at a funeral. It appears that her focusing and STM are not good without Adderall so we restarted 15 mg daily but focusing was still suboptimal so dose was increased to 20 mg bid. She has not picked up the medicine as yet because she did not have money for copayment (will have it next week though)..    Visit Diagnosis:    ICD-10-CM   1. 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  Past Medical History:  Past Medical History:  Diagnosis Date  . ADHD (attention deficit hyperactivity disorder)   . Anxiety   .  Depression   . Precocious puberty    Was followed by endocrinology.  . Pseudotumor cerebri     Past Surgical History:  Procedure Laterality Date  . NO PAST SURGERIES      Family Psychiatric History: None.  Family History:  Family History  Problem Relation Age of Onset  . Hypertension Mother   . Heart disease Mother   . Obesity Mother   . Diabetes Father   . Heart disease Father   . Cancer Neg Hx     Social History:  Social History   Socioeconomic History  . Marital status: Single    Spouse name: Not on file  . Number of children: 0  . Years of education: Not on file  . Highest education level: Not on file  Occupational History  . 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 Smoking status: Never Smoker  . Smokeless tobacco: Never Used  Vaping Use  . Vaping Use: Never used  Substance and Sexual Activity  . Alcohol use: No    Alcohol/week: 0.0 standard drinks  . Drug use: No  . Sexual activity: Not on file  Other Topics Concern  . Not on file  Social History Narrative   Caffeine Use:  3 daily   Regular Exercise:  2-3 x weekly   Lives with Mom step dad and brother   Attends [employer].   Patient is the granddaughter of [name]         Social Determinants of Health   Financial Resource Strain:   . Difficulty of  Paying Living Expenses: Not on file  Food Insecurity:   . Worried About Programme researcher, broadcasting/film/video in the Last Year: Not on file  . Ran Out of Food in the Last Year: Not on file  Transportation Needs:   . Lack of Transportation (Medical): Not on file  . Lack of Transportation (Non-Medical): Not on file  Physical Activity:   . Days of Exercise per Week: Not on file  . Minutes of Exercise per Session: Not on file  Stress:   . Feeling of Stress : Not on file  Social Connections:   . Frequency of Communication with Friends and Family: Not on file  . Frequency of Social Gatherings with Friends and Family: Not on file  . Attends  Religious Services: Not on file  . Active Member of Clubs or Organizations: Not on file  . Attends Banker Meetings: Not on file  . Marital Status: Not on file    Allergies:  Allergies  Allergen Reactions  . Lupron [Leuprolide Acetate]     Was allergic to a preservative in the Lupron.    Metabolic Disorder Labs: Lab Results  Component Value Date   HGBA1C 6.1 (H) 02/09/2020   No results found for: PROLACTIN Lab Results  Component Value Date   CHOL 218 (H) 02/09/2020   TRIG 88 02/09/2020   HDL 45 02/09/2020   CHOLHDL 5 08/26/2018   VLDL 29.2 08/26/2018   LDLCALC 157 (H) 02/09/2020   LDLCALC 105 (H) 08/26/2018   Lab Results  Component Value Date   TSH 3.170 02/09/2020   TSH 1.97 01/21/2019    Therapeutic Level Labs: No results found for: LITHIUM No results found for: VALPROATE No components found for:  CBMZ  Current Medications: Current Outpatient Medications  Medication Sig Dispense Refill  . amphetamine-dextroamphetamine (ADDERALL) 20 MG tablet Take 1 tablet (20 mg total) by mouth 2 (two) times daily with breakfast and lunch. 60 tablet 0  . amphetamine-dextroamphetamine (ADDERALL) 20 MG tablet Take 1 tablet (20 mg total) by mouth 2 (two) times daily with breakfast and lunch. 60 tablet 0  . [START ON 09/03/2020] amphetamine-dextroamphetamine (ADDERALL) 20 MG tablet Take 1 tablet (20 mg total) by mouth 2 (two) times daily with breakfast and lunch. 60 tablet 0  . iron polysaccharides (NU-IRON) 150 MG capsule Take 1 capsule (150 mg total) by mouth daily. 30 capsule 0  . traZODone (DESYREL) 50 MG tablet TAKE 1 TABLET (50 MG TOTAL) BY MOUTH AT BEDTIME AS NEEDED FOR SLEEP. (Patient not taking: Reported on 07/28/2020)    . triamcinolone cream (KENALOG) 0.1 % Apply 1 application topically 2 (two) times daily. (Patient not taking: Reported on 07/28/2020) 30 g 0  . Vitamin D, Ergocalciferol, (DRISDOL) 1.25 MG (50000 UNIT) CAPS capsule Take 1 capsule (50,000 Units total)  by mouth every 7 (seven) days. (Patient not taking: Reported on 07/28/2020) 4 capsule 0   No current facility-administered medications for this visit.      Psychiatric Specialty Exam: Review of Systems  Psychiatric/Behavioral: Positive for decreased concentration.  All other systems reviewed and are negative.   There were no vitals taken for this visit.There is no height or weight on file to calculate BMI.  General Appearance: NA  Eye Contact:  NA  Speech:  Clear and Coherent and Normal Rate  Volume:  Normal  Mood:  Euthymic  Affect:  NA  Thought Process:  Goal Directed  Orientation:  Full (Time, Place, and Person)  Thought Content: Logical  Suicidal Thoughts:  No  Homicidal Thoughts:  No  Memory:  Immediate;   Good Recent;   Good Remote;   Good  Judgement:  Good  Insight:  Good  Psychomotor Activity:  NA  Concentration:  Concentration: Fair  Recall:  Good  Fund of Knowledge: Good  Language: Good  Akathisia:  Negative  Handed:  Right  AIMS (if indicated): not done  Assets:  Communication Skills Desire for Improvement Housing Physical Health Social Support  ADL's:  Intact  Cognition: WNL  Sleep:  Fair   Screenings: GAD-7     Office Visit from 04/29/2019 in [facility]  Total GAD-7 Score 14    PHQ2-9     Video Visit from 06/15/2020 in [facility] Office Visit from 02/09/2020 in [facility] Office Visit from 04/29/2019 in [facility] Office Visit from 08/26/2018 in [facility] Office Visit from 11/10/2016 in [facility]  PHQ-2 Total Score 0 2 4 0 4  PHQ-9 Total Score -- 7 13 1 10        Assessment and Plan: 24yo single AAF witha hx of rapid mood fluctuations, racing thoughts, insomnia, anger/irritability and problems with focusing/atttemtion. She also  hadfrequentpanic attacks with no identifiable triggers. She has been diagnosed with ADHD at age 14and was on Adderall and Strattera in the past. She found the formermuch morehelpful. She waslatersuspected of having bipolar 2 disorder and was tried on Latudathen Abilify then Lamictal. She became more depressed with passive SI in early October 2020 and entered [facility] on 10/14 (completed 10/30).  She run out of Adderall some time in April and did not call to have it continued. She also stopped taking Abilify and Lamictal. She has been off them for over 3 months and she denies having mood swings. She missed counseling appointment with May because she was at a funeral. It appears that her focusing and STM are not good without Adderall so we restarted 15 mg daily but focusing was still suboptimal so dose was increased to 20 mg bid. She has not picked up the medicine as yet because she did not have money for co-payment (will have it next week though)..   Dx: ADHD;Panic disorderhx of  Plan: We willcontinue Addearll to 20 mg bid - she has 3 monthly Rx witing. Next appointment in3 months.The plan was discussed with patient who had an opportunity to ask questions and these were all answered. I spend53minutes inphone consultation with the patient.    12m, MD 08/24/2020, 1:38 PM

## 2020-09-22 ENCOUNTER — Encounter: Payer: Self-pay | Admitting: Physician Assistant

## 2020-09-22 ENCOUNTER — Ambulatory Visit (INDEPENDENT_AMBULATORY_CARE_PROVIDER_SITE_OTHER): Payer: BC Managed Care – PPO | Admitting: Physician Assistant

## 2020-09-22 ENCOUNTER — Other Ambulatory Visit: Payer: Self-pay

## 2020-09-22 VITALS — BP 110/80 | HR 89 | Temp 98.4°F | Resp 16 | Ht 67.0 in | Wt 286.0 lb

## 2020-09-22 DIAGNOSIS — S93402D Sprain of unspecified ligament of left ankle, subsequent encounter: Secondary | ICD-10-CM

## 2020-09-22 NOTE — Patient Instructions (Signed)
Wear the brace throughout the day, taking off when resting at home. Keep the leg elevated. At this point you can alternate ice and heat to the area.  Alternate tylenol and ibuprofen for pain.  Start the alphabet exercises as discussed.  If not improving over the next week I want to set you up with sports medicine/physical therapy.   Try to wean off of the crutches over the next week, making sure to wear supportive lace up footwear.   Ankle Sprain  An ankle sprain is a stretch or tear in one of the tough tissues (ligaments) that connect the bones in your ankle. An ankle sprain can happen when the ankle rolls outward (inversion sprain) or inward (eversion sprain). What are the causes? This condition is caused by rolling or twisting the ankle. What increases the risk? You are more likely to develop this condition if you play sports. What are the signs or symptoms? Symptoms of this condition include:  Pain in your ankle.  Swelling.  Bruising. This may happen right after you sprain your ankle or 1-2 days later.  Trouble standing or walking. How is this diagnosed? This condition is diagnosed with:  A physical exam. During the exam, your doctor will press on certain parts of your foot and ankle and try to move them in certain ways.  X-ray imaging. These may be taken to see how bad the sprain is and to check for broken bones. How is this treated? This condition may be treated with:  A brace or splint. This is used to keep the ankle from moving until it heals.  An elastic bandage. This is used to support the ankle.  Crutches.  Pain medicine.  Surgery. This may be needed if the sprain is very bad.  Physical therapy. This may help to improve movement in the ankle. Follow these instructions at home: If you have a brace or a splint:  Wear the brace or splint as told by your doctor. Remove it only as told by your doctor.  Loosen the brace or splint if your  toes: ? Tingle. ? Lose feeling (become numb). ? Turn cold and blue.  Keep the brace or splint clean.  If the brace or splint is not waterproof: ? Do not let it get wet. ? Cover it with a watertight covering when you take a bath or a shower. If you have an elastic bandage (dressing):  Remove it to shower or bathe.  Try not to move your ankle much, but wiggle your toes from time to time. This helps to prevent swelling.  Adjust the dressing if it feels too tight.  Loosen the dressing if your foot: ? Loses feeling. ? Tingles. ? Becomes cold and blue. Managing pain, stiffness, and swelling   Take over-the-counter and prescription medicines only as told by doctor.  For 2-3 days, keep your ankle raised (elevated) above the level of your heart.  If told, put ice on the injured area: ? If you have a removable brace or splint, remove it as told by your doctor. ? Put ice in a plastic bag. ? Place a towel between your skin and the bag. ? Leave the ice on for 20 minutes, 2-3 times a day. General instructions  Rest your ankle.  Do not use your injured leg to support your body weight until your doctor says that you can. Use crutches as told by your doctor.  Do not use any products that contain nicotine or tobacco, such as cigarettes,  e-cigarettes, and chewing tobacco. If you need help quitting, ask your doctor.  Keep all follow-up visits as told by your doctor. Contact a doctor if:  Your bruises or swelling are quickly getting worse.  Your pain does not get better after you take medicine. Get help right away if:  You cannot feel your toes or foot.  Your foot or toes look blue.  You have very bad pain that gets worse. Summary  An ankle sprain is a stretch or tear in one of the tough tissues (ligaments) that connect the bones in your ankle.  This condition is caused by rolling or twisting the ankle.  Symptoms include pain, swelling, bruising, and trouble walking.  To  help with pain and swelling, put ice on the injured ankle, raise your ankle above the level of your heart, and use an elastic bandage. Also, rest as told by your doctor.  Keep all follow-up visits as told by your doctor. This is important. This information is not intended to replace advice given to you by your health care provider. Make sure you discuss any questions you have with your health care provider. Document Revised: 04/09/2018 Document Reviewed: 04/09/2018 Elsevier Patient Education  2020 [employer].

## 2020-09-22 NOTE — Progress Notes (Signed)
Patient presents to clinic today for ER follow-up after left ankle injury.  Patient was seen at [facility] after twisting her ankle while at work.  ER work-up included imaging of the foot and ankle which were negative for acute fracture.  Was diagnosed with ankle sprain.  Ace wrap was applied to the foot and patient was given crutches.  RICE therapy recommended.  Patient was instructed to follow-up with primary care provider within a week if needed.  Since ER visit patient endorses having continued pain about a 3-4 when resting.  Increase with ambulation.  Notes swelling has resolved.  Denies any noted bruising, numbness or tingling of the foot.  Has been wearing the Ace wrap as directed but is unsure if this is helping.  Has not taken anything for symptoms as she was not sure what she should take.  Denies any new or worsening symptoms.  Past Medical History:  Diagnosis Date   ADHD (attention deficit hyperactivity disorder)    Anxiety    Depression    Precocious puberty    Was followed by endocrinology.   Pseudotumor cerebri     Current Outpatient Medications on File Prior to Visit  Medication Sig Dispense Refill   amphetamine-dextroamphetamine (ADDERALL) 20 MG tablet Take 1 tablet (20 mg total) by mouth 2 (two) times daily with breakfast and lunch. 60 tablet 0   amphetamine-dextroamphetamine (ADDERALL) 20 MG tablet Take 1 tablet (20 mg total) by mouth 2 (two) times daily with breakfast and lunch. 60 tablet 0   amphetamine-dextroamphetamine (ADDERALL) 20 MG tablet Take 1 tablet (20 mg total) by mouth 2 (two) times daily with breakfast and lunch. 60 tablet 0   iron polysaccharides (NU-IRON) 150 MG capsule Take 1 capsule (150 mg total) by mouth daily. (Patient not taking: Reported on 09/22/2020) 30 capsule 0   No current facility-administered medications on file prior to visit.    Allergies  Allergen Reactions   Lupron [Leuprolide Acetate]     Was allergic to a  preservative in the Lupron.    Family History  Problem Relation Age of Onset   Hypertension Mother    Heart disease Mother    Obesity Mother    Diabetes Father    Heart disease Father    Cancer Neg Hx     Social History   Socioeconomic History   Marital status: Single    Spouse name: Not on file   Number of children: 0   Years of education: Not on file   Highest education level: Not on file  Occupational History   Occupation: student    Comment: [employer] - business  Tobacco Use   Smoking status: Never Smoker   Smokeless tobacco: Never Used  Building services engineer Use: Never used  Substance and Sexual Activity   Alcohol use: No    Alcohol/week: 0.0 standard drinks   Drug use: No   Sexual activity: Not on file  Other Topics Concern   Not on file  Social History Narrative   Caffeine Use:  3 daily   Regular Exercise:  2-3 x weekly   Lives with Mom step dad and brother   Attends [employer].   Patient is the granddaughter of [name]         Social Determinants of Health   Financial Resource Strain:    Difficulty of Paying Living Expenses: Not on file  Food Insecurity:    Worried About Programme researcher, broadcasting/film/video in the Last Year:  Not on file   Ran Out of Food in the Last Year: Not on file  Transportation Needs:    Lack of Transportation (Medical): Not on file   Lack of Transportation (Non-Medical): Not on file  Physical Activity:    Days of Exercise per Week: Not on file   Minutes of Exercise per Session: Not on file  Stress:    Feeling of Stress : Not on file  Social Connections:    Frequency of Communication with Friends and Family: Not on file   Frequency of Social Gatherings with Friends and Family: Not on file   Attends Religious Services: Not on file   Active Member of Clubs or Organizations: Not on file   Attends Banker Meetings: Not on file   Marital Status: Not on file   Review of Systems - See HPI.  All other ROS  are negative.  BP 110/80    Pulse 89    Temp 98.4 F (36.9 C) (Temporal)    Resp 16    Ht 5\' 7"  (1.702 m)    Wt 286 lb (129.7 kg)    SpO2 99%    BMI 44.79 kg/m   Physical Exam Vitals reviewed.  Constitutional:      Appearance: Normal appearance.  HENT:     Head: Normocephalic and atraumatic.  Cardiovascular:     Rate and Rhythm: Normal rate and regular rhythm.  Pulmonary:     Effort: Pulmonary effort is normal.  Musculoskeletal:     Cervical back: Neck supple.     Right ankle: Normal.     Left ankle: No swelling, deformity or ecchymosis. Tenderness present over the ATF ligament and AITF ligament. No lateral malleolus, medial malleolus, base of 5th metatarsal or proximal fibula tenderness. Decreased range of motion (2/2 pain only).     Left Achilles Tendon: Normal.  Neurological:     General: No focal deficit present.     Mental Status: She is alert and oriented to person, place, and time.  Psychiatric:        Mood and Affect: Mood normal.    Assessment/Plan: 1. Moderate left ankle sprain, subsequent encounter Mild ligamentous tenderness noted on examination.  Range of motion overall within normal limits.  Only active range of motion slightly decreased secondary to pain.  Passive range of motion within normal limits.  Pulses 2+ and equal.  Ace wrap to be discontinued.  Patient placed in an ASO lace up brace.  Can continue crutches for the next few days but needs to wean herself from these.  Discussed stretching exercises for patient.  Alternate Tylenol and ibuprofen as needed for pain.  If not continuing to improve over the next 1 to 2 weeks or if anything worsens will refer her to sports medicine.  This visit occurred during the SARS-CoV-2 public health emergency.  Safety protocols were in place, including screening questions prior to the visit, additional usage of staff PPE, and extensive cleaning of exam room while observing appropriate contact time as indicated for disinfecting  solutions.      , PA-C

## 2020-11-16 ENCOUNTER — Other Ambulatory Visit: Payer: Self-pay

## 2020-11-16 ENCOUNTER — Telehealth (INDEPENDENT_AMBULATORY_CARE_PROVIDER_SITE_OTHER): Payer: BC Managed Care – PPO | Admitting: Psychiatry

## 2020-11-16 DIAGNOSIS — F988 Other specified behavioral and emotional disorders with onset usually occurring in childhood and adolescence: Secondary | ICD-10-CM

## 2020-11-16 MED ORDER — AMPHETAMINE-DEXTROAMPHETAMINE 20 MG PO TABS: 20.0000 mg | ORAL_TABLET | Freq: Two times a day (BID) | ORAL | 0 refills | Status: DC

## 2020-11-16 MED ORDER — AMPHETAMINE-DEXTROAMPHETAMINE 20 MG PO TABS
20.0000 mg | ORAL_TABLET | Freq: Two times a day (BID) | ORAL | 0 refills | Status: DC
Start: 2020-11-16 — End: 2021-02-15

## 2020-11-16 NOTE — Progress Notes (Signed)
BH MD/PA/NP OP Progress Note  11/16/2020 11:16 AM Khalessi Blough  MRN:  [id number] Interview was conducted by phone and I verified that I was speaking with the correct person using two identifiers. I discussed the limitations of evaluation and management by telemedicine and  the availability of in person appointments. Patient expressed understanding and agreed to proceed. Participants in the visit: patient (location - home); physician (location - home office).  Chief Complaint: None.  HPI: 24yo single AAF with ADHD diagnosed age 72. She was on Adderall and Strattera in the past. She found the formermuch morehelpful. She waslatersuspected of having bipolar 2 disorder and was tried on Latudathen Abilify then Lamictal. She became more depressed with passive SI in early October 2020 and entered [facility] on 10/14 (completed 10/30).  She run out of Adderall some time in April and did not call to have it continued. She also stopped taking Abilify and Lamictal. She has been off them for over 3 months and she denies having mood swings. She missed counseling appointment with Idalia Needle because she was at a funeral. It appears that her focusing and STM are not good without Adderall so we restarted 15 mg daily but focusing was still suboptimal so dose was increased to 20 mg bid. It works well and is well tolerated.      Visit Diagnosis:    ICD-10-CM   1. 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  Past Medical History:  Past Medical History:  Diagnosis Date  . ADHD (attention deficit hyperactivity disorder)   . Anxiety   . Depression   . Precocious puberty    Was followed by endocrinology.  . Pseudotumor cerebri     Past Surgical History:  Procedure Laterality Date  . NO PAST SURGERIES      Family Psychiatric History: None.  Family History:  Family History  Problem Relation Age of Onset  .  Hypertension Mother   . Heart disease Mother   . Obesity Mother   . Diabetes Father   . Heart disease Father   . Cancer Neg Hx     Social History:  Social History   Socioeconomic History  . Marital status: Single    Spouse name: Not on file  . Number of children: 0  . Years of education: Not on file  . Highest education level: Not on file  Occupational History  . 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 Smoking status: Never Smoker  . Smokeless tobacco: Never Used  Vaping Use  . Vaping Use: Never used  Substance and Sexual Activity  . Alcohol use: No    Alcohol/week: 0.0 standard drinks  . Drug use: No  . Sexual activity: Not on file  Other Topics Concern  . Not on file  Social History Narrative   Caffeine Use:  3 daily   Regular Exercise:  2-3 x weekly   Lives with Mom step dad and brother   Attends [employer].   Patient is the granddaughter of [name]         Social Determinants of Health   Financial Resource Strain: Not on [employer] Insecurity: Not on file  Transportation Needs: Not on file  Physical Activity: Not on file  Stress: Not on file  Social Connections: Not on file    Allergies:  Allergies  Allergen Reactions  . Lupron [Leuprolide Acetate]  Was allergic to a preservative in the Lupron.    Metabolic Disorder Labs: Lab Results  Component Value Date   HGBA1C 6.1 (H) 02/09/2020   No results found for: PROLACTIN Lab Results  Component Value Date   CHOL 218 (H) 02/09/2020   TRIG 88 02/09/2020   HDL 45 02/09/2020   CHOLHDL 5 08/26/2018   VLDL 29.2 08/26/2018   LDLCALC 157 (H) 02/09/2020   LDLCALC 105 (H) 08/26/2018   Lab Results  Component Value Date   TSH 3.170 02/09/2020   TSH 1.97 01/21/2019    Therapeutic Level Labs: No results found for: LITHIUM No results found for: VALPROATE No components found for:  CBMZ  Current Medications: Current Outpatient Medications  Medication Sig Dispense Refill  .  amphetamine-dextroamphetamine (ADDERALL) 20 MG tablet Take 1 tablet (20 mg total) by mouth 2 (two) times daily. 60 tablet 0  . [START ON 12/16/2020] amphetamine-dextroamphetamine (ADDERALL) 20 MG tablet Take 1 tablet (20 mg total) by mouth 2 (two) times daily. 60 tablet 0  . [START ON 01/15/2021] amphetamine-dextroamphetamine (ADDERALL) 20 MG tablet Take 1 tablet (20 mg total) by mouth 2 (two) times daily. 60 tablet 0  . iron polysaccharides (NU-IRON) 150 MG capsule Take 1 capsule (150 mg total) by mouth daily. (Patient not taking: Reported on 09/22/2020) 30 capsule 0   No current facility-administered medications for this visit.      Psychiatric Specialty Exam: Review of Systems  All other systems reviewed and are negative.   There were no vitals taken for this visit.There is no height or weight on file to calculate BMI.  General Appearance: NA  Eye Contact:  NA  Speech:  Clear and Coherent and Normal Rate  Volume:  Normal  Mood:  Euthymic  Affect:  NA  Thought Process:  Goal Directed and Linear  Orientation:  Full (Time, Place, and Person)  Thought Content: Logical   Suicidal Thoughts:  No  Homicidal Thoughts:  No  Memory:  Immediate;   Good Recent;   Good Remote;   Good  Judgement:  Good  Insight:  Good  Psychomotor Activity:  NA  Concentration:  Concentration: Good  Recall:  Good  Fund of Knowledge: Good  Language: Good  Akathisia:  Negative  Handed:  Right  AIMS (if indicated): not done  Assets:  Communication Skills Desire for Improvement Financial Resources/Insurance Housing Social Support Talents/Skills  ADL's:  Intact  Cognition: WNL  Sleep:  Good   Screenings: GAD-7   Flowsheet Row Office Visit from 04/29/2019 in [facility]  Total GAD-7 Score 14    PHQ2-9   Flowsheet Row Video Visit from 06/15/2020 in [facility] Office Visit from 02/09/2020 in [facility]  Office Visit from 04/29/2019 in [facility] Office Visit from 08/26/2018 in [facility] Office Visit from 11/10/2016 in [facility]  PHQ-2 Total Score 0 2 4 0 4  PHQ-9 Total Score -- 7 13 1 10        Assessment and Plan: 24yo single AAF with ADHD diagnosed age 42. She was on Adderall and Strattera in the past. She found the formermuch morehelpful. She waslatersuspected of having bipolar 2 disorder and was tried on Latudathen Abilify then Lamictal. She became more depressed with passive SI in early October 2020 and entered [facility] on [facility]/14 (completed 10/30).  She run out of Adderall some time in April and did not call to have  it continued. She also stopped taking Abilify and Lamictal. She has been off them for over 3 months and she denies having mood swings. She missed counseling appointment with Idalia Needle because she was at a funeral. It appears that her focusing and STM are not good without Adderall so we restarted 15 mg daily but focusing was still suboptimal so dose was increased to 20 mg bid. It works well and is well tolerated.    Dx: ADHD inattentive type  Plan: We willcontinue Addearllto 20 mg bid.Next appointment in3 months.The plan was discussed with patient who had an opportunity to ask questions and these were all answered. I spend102minutes inphone consultationwith the patient.    Magdalene Patricia, MD 11/16/2020, 11:16 AM

## 2021-02-15 ENCOUNTER — Telehealth (INDEPENDENT_AMBULATORY_CARE_PROVIDER_SITE_OTHER): Payer: BC Managed Care – PPO | Admitting: Psychiatry

## 2021-02-15 ENCOUNTER — Other Ambulatory Visit: Payer: Self-pay

## 2021-02-15 DIAGNOSIS — F988 Other specified behavioral and emotional disorders with onset usually occurring in childhood and adolescence: Secondary | ICD-10-CM | POA: Diagnosis not present

## 2021-02-15 MED ORDER — AMPHETAMINE-DEXTROAMPHETAMINE 20 MG PO TABS: 20.0000 mg | ORAL_TABLET | Freq: Two times a day (BID) | ORAL | 0 refills | Status: AC

## 2021-02-15 NOTE — Progress Notes (Signed)
BH MD/PA/NP OP Progress Note  02/15/2021 11:19 AM Annette Molina  MRN:  [id number] Interview was conducted by phone and I verified that I was speaking with the correct person using two identifiers. I discussed the limitations of evaluation and management by telemedicine and  the availability of in person appointments. Patient expressed understanding and agreed to proceed. Participants in the visit: patient (location - home); physician (location - home office).  Chief Complaint: None.  HPI: 24yo single AAF with ADHD diagnosed age 52. She was on Adderall and Strattera in the past. She found the formermuch morehelpful. She waslatersuspected of havingbipolar 2 disorder andwastried on Latudathen Abilifythen Lamictal.She became more depressed with passive SI in early October2020and entered [facility] on 10[facility]14 (completed 10/30). She run out of Adderall some time in Apr til and did not call to have it continued. She also stopped taking Abilify and Lamictal. She has been offhem for over 3 months and she denies having mood swings.She missed counseling appointment with Idalia Needle because she was at a funeral. It appears that her focusing and STM are not good without Adderall so we restarted 15 mg daily but focusingwas still suboptimalso dose was increased to 20 mg bid. It works well and is well tolerated.    Visit Diagnosis:    ICD-10-CM   1. 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  Past Medical History:  Past Medical History:  Diagnosis Date  . ADHD (attention deficit hyperactivity disorder)   . Anxiety   . Depression   . Precocious puberty    Was followed by endocrinology.  . Pseudotumor cerebri     Past Surgical History:  Procedure Laterality Date  . NO PAST SURGERIES      Family Psychiatric History: None.  Family History:  Family History  Problem Relation Age of Onset  . Hypertension  Mother   . Heart disease Mother   . Obesity Mother   . Diabetes Father   . Heart disease Father   . Cancer Neg Hx     Social History:  Social History   Socioeconomic History  . Marital status: Single    Spouse name: Not on file  . Number of children: 0  . Years of education: Not on file  . Highest education level: Not on file  Occupational History  . 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 Smoking status: Never Smoker  . Smokeless tobacco: Never Used  Vaping Use  . Vaping Use: Never used  Substance and Sexual Activity  . Alcohol use: No    Alcohol/week: 0.0 standard drinks  . Drug use: No  . Sexual activity: Not on file  Other Topics Concern  . Not on file  Social History Narrative   Caffeine Use:  3 daily   Regular Exercise:  2-3 x weekly   Lives with Mom step dad and brother   Attends [employer].   Patient is the granddaughter of [name]         Social Determinants of Health   Financial Resource Strain: Not on [employer] Insecurity: Not on file  Transportation Needs: Not on file  Physical Activity: Not on file  Stress: Not on file  Social Connections: Not on file    Allergies:  Allergies  Allergen Reactions  . Lupron [Leuprolide Acetate]     Was allergic to a preservative in the Lupron.  Metabolic Disorder Labs: Lab Results  Component Value Date   HGBA1C 6.1 (H) 02/09/2020   No results found for: PROLACTIN Lab Results  Component Value Date   CHOL 218 (H) 02/09/2020   TRIG 88 02/09/2020   HDL 45 02/09/2020   CHOLHDL 5 08/26/2018   VLDL 29.2 08/26/2018   LDLCALC 157 (H) 02/09/2020   LDLCALC 105 (H) 08/26/2018   Lab Results  Component Value Date   TSH 3.170 02/09/2020   TSH 1.97 01/21/2019    Therapeutic Level Labs: No results found for: LITHIUM No results found for: VALPROATE No components found for:  CBMZ  Current Medications: Current Outpatient Medications  Medication Sig Dispense Refill  .  amphetamine-dextroamphetamine (ADDERALL) 20 MG tablet Take 1 tablet (20 mg total) by mouth 2 (two) times daily with breakfast and lunch. 60 tablet 0  . [START ON 03/17/2021] amphetamine-dextroamphetamine (ADDERALL) 20 MG tablet Take 1 tablet (20 mg total) by mouth 2 (two) times daily with breakfast and lunch. 60 tablet 0  . [START ON 04/16/2021] amphetamine-dextroamphetamine (ADDERALL) 20 MG tablet Take 1 tablet (20 mg total) by mouth 2 (two) times daily with breakfast and lunch. 60 tablet 0  . iron polysaccharides (NU-IRON) 150 MG capsule Take 1 capsule (150 mg total) by mouth daily. (Patient not taking: Reported on 09/22/2020) 30 capsule 0   No current facility-administered medications for this visit.      Psychiatric Specialty Exam: Review of Systems  Psychiatric/Behavioral: Positive for decreased concentration.  All other systems reviewed and are negative.   There were no vitals taken for this visit.There is no height or weight on file to calculate BMI.  General Appearance: NA  Eye Contact:  NA  Speech:  Clear and Coherent and Normal Rate  Volume:  Normal  Mood:  Euthymic  Affect:  NA  Thought Process:  Goal Directed  Orientation:  Full (Time, Place, and Person)  Thought Content: Logical   Suicidal Thoughts:  No  Homicidal Thoughts:  No  Memory:  Immediate;   Good Recent;   Good Remote;   Good  Judgement:  Good  Insight:  Good  Psychomotor Activity:  NA  Concentration:  Concentration: Fair  Recall:  Good  Fund of Knowledge: Good  Language: Good  Akathisia:  Negative  Handed:  Right  AIMS (if indicated): not done  Assets:  Communication Skills Desire for Improvement Housing Physical Health Social Support Talents/Skills  ADL's:  Intact  Cognition: WNL  Sleep:  Good   Screenings: GAD-7   Flowsheet Row Office Visit from 04/29/2019 in [facility]  Total GAD-7 Score 14    PHQ2-9   Flowsheet Row Video Visit from 06/15/2020 in  [facility] Office Visit from 02/09/2020 in [facility] Office Visit [facility]2019 in  [facility] Office Visit from 08/26/2018 in [facility] Office Visit from 11/10/2016 in [facility] [facility]  PHQ-2 Total Score 0 2 4 0 4  PHQ-9 Total Score - 7 13 1 10        Assessment and Plan: 24yo single AAF with ADHD diagnosed age 2. She was on Adderall and Strattera in the past. She found the formermuch morehelpful. She waslatersuspected of havingbipolar 2 disorder andwastried on Latudathen Abilifythen Lamictal.She became more depressed with passive SI in early October2020and entered [facility] on [facility] (completed 10/30). She run out of Adderall some time in Apr til and did not call to have it continued.  She also stopped taking Abilify and Lamictal. She has been offhem for over 3 months and she denies having mood swings.She missed counseling appointment with Idalia Needle because she was at a funeral. It appears that her focusing and STM are not good without Adderall so we restarted 15 mg daily but focusingwas still suboptimalso dose was increased to 20 mg bid. It works well and is well tolerated.    Dx: ADHD inattentive type  Plan: We willcontinueAddearllto 20 mg bid.Next appointment in3 months.The plan was discussed with patient who had an opportunity to ask questions and these were all answered. I spend22minutes inphone consultationwith the patient.    Magdalene Patricia, MD 02/15/2021, 11:19 AM

## 2021-02-21 ENCOUNTER — Other Ambulatory Visit (HOSPITAL_COMMUNITY): Payer: Self-pay | Admitting: Psychiatry

## 2021-02-23 ENCOUNTER — Other Ambulatory Visit: Payer: Self-pay

## 2021-02-23 ENCOUNTER — Ambulatory Visit (INDEPENDENT_AMBULATORY_CARE_PROVIDER_SITE_OTHER): Payer: BC Managed Care – PPO | Admitting: Family Medicine

## 2021-02-23 ENCOUNTER — Encounter: Payer: Self-pay | Admitting: Family Medicine

## 2021-02-23 ENCOUNTER — Ambulatory Visit (HOSPITAL_BASED_OUTPATIENT_CLINIC_OR_DEPARTMENT_OTHER)
Admission: RE | Admit: 2021-02-23 | Discharge: 2021-02-23 | Disposition: A | Discharge status: Home / Self Care | Payer: BC Managed Care – PPO | Source: Ambulatory Visit | Attending: Family Medicine | Admitting: Family Medicine

## 2021-02-23 VITALS — BP 128/82 | HR 74 | Temp 98.8°F | Ht 67.0 in | Wt 268.0 lb

## 2021-02-23 DIAGNOSIS — M79644 Pain in right finger(s): Secondary | ICD-10-CM | POA: Insufficient documentation

## 2021-02-23 MED ORDER — PREDNISONE 20 MG PO TABS: 40.0000 mg | ORAL_TABLET | Freq: Every day | ORAL | 0 refills | Status: AC

## 2021-02-23 NOTE — Progress Notes (Signed)
Musculoskeletal Exam  Patient: Annette Molina DOB: 02-Feb-1996  DOS: 02/23/2021  SUBJECTIVE:  Chief Complaint:   Chief Complaint  Patient presents with  . Finger Injury    Right hand middle finger   . Cough    Annette Molina is a 25 y.o.  female for evaluation and treatment of R middle finger pain.   Onset:  3 days ago. No inj or change in activity.  Location: PIP on 3rd R digit Character:  throbbing  Progression of issue:  has worsened Associated symptoms: swelling, bruising, no redness Treatment: to date has been ice and OTC NSAIDS.   Neurovascular symptoms: no  Past Medical History:  Diagnosis Date  . ADHD (attention deficit hyperactivity disorder)   . Anxiety   . Depression   . Precocious puberty    Was followed by endocrinology.  . Pseudotumor cerebri     Objective: VITAL SIGNS: BP 128/82 (BP Location: Right Arm, Patient Position: Sitting, Cuff Size: Large)   Pulse 74   Temp 98.8 F (37.1 C) (Oral)   Ht 5\' 7"  (1.702 m)   Wt 268 lb (121.6 kg)   LMP 02/16/2021 (Approximate)   SpO2 99%   BMI 41.97 kg/m  Constitutional: Well formed, well developed. No acute distress. Heart: Brisk cap refill Thorax & Lungs: No accessory muscle use Musculoskeletal: R middle finger.   Normal active range of motion: no.   Normal passive range of motion: yes Tenderness to palpation: yes Deformity: no Ecchymosis: no Slight soft tissue edema noted There is no streaking redness/erythema, no ttp over flexor tendons Neurologic: Normal sensory function.  Psychiatric: Normal mood. Age appropriate judgment and insight. Alert & oriented x 3.    Assessment:  Pain of right middle finger - Plan: DG Finger Middle Right, predniSONE (DELTASONE) 20 MG tablet  Plan: XR unofficially neg, await official read, buddy taping, ice, Tylenol. 5 d pred burst 40 mg/d. Send message over weekend if no better, will refer to sports med.  F/u prn. The patient voiced understanding and  agreement to the plan.   02/18/2021 [place], DO 02/23/21  3:24 PM

## 2021-02-23 NOTE — Patient Instructions (Addendum)
Ice/cold pack over area for 10-15 min twice daily.  Send me a message in 4-5 days if we aren't turning the corner or making meaningful improvement.   OK to take Tylenol 1000 mg (2 extra strength tabs) or 975 mg (3 regular strength tabs) every 6 hours as needed.  Keep moving your finger as much as possible.  We will be in touch regarding your X-ray.   Consider buddy taping or wearing a frog splint for support.   Let us know if you need anything.

## 2021-05-23 ENCOUNTER — Encounter: Payer: BC Managed Care – PPO | Admitting: Family Medicine

## 2021-05-27 ENCOUNTER — Encounter: Payer: BC Managed Care – PPO | Admitting: Family Medicine

## 2021-06-01 ENCOUNTER — Encounter: Payer: BC Managed Care – PPO | Admitting: Family Medicine

## 2021-06-21 ENCOUNTER — Encounter: Payer: BC Managed Care – PPO | Admitting: Nurse Practitioner

## 2022-06-12 ENCOUNTER — Telehealth: Payer: Self-pay | Admitting: Family Medicine

## 2022-06-12 NOTE — Telephone Encounter (Signed)
Needs to be a 30 min for procedure

## 2022-06-12 NOTE — Telephone Encounter (Signed)
It looks like Dr.Wendling was removed because she initiated a TOC to [facility] but never completed it. Changed appt to procedure.

## 2022-06-12 NOTE — Telephone Encounter (Signed)
Pt made an appt via mychart for skin tag removal. Looks like she had a TOC but cancelled it, please advise if she is still a current pt of Dr. Carmelia Roller and I will change appt type to procedure. If not I will call and let her know.

## 2022-06-12 NOTE — Telephone Encounter (Signed)
Not sure it ever was. I can see her for a procedure. Ty.

## 2022-06-12 NOTE — Telephone Encounter (Signed)
His name is not on her chart as PCP. So am guessing it was removed for some reason. Not sure what happened, but she should not be able to schedule a procedure on my chart. Is she your patient or not?

## 2022-06-23 ENCOUNTER — Ambulatory Visit: Payer: Self-pay | Admitting: Family Medicine

## 2022-07-05 ENCOUNTER — Encounter (INDEPENDENT_AMBULATORY_CARE_PROVIDER_SITE_OTHER): Payer: Self-pay
